# Patient Record
Sex: Male | Born: 1940
Health system: Southern US, Community
[De-identification: ages and names within clinical notes are randomized; demographics above are authoritative.]

## PROBLEM LIST (undated history)

## (undated) DIAGNOSIS — Z860100 Personal history of colon polyps, unspecified: Secondary | ICD-10-CM

## (undated) DIAGNOSIS — K5790 Diverticulosis of intestine, part unspecified, without perforation or abscess without bleeding: Secondary | ICD-10-CM

## (undated) DIAGNOSIS — R351 Nocturia: Secondary | ICD-10-CM

## (undated) DIAGNOSIS — E785 Hyperlipidemia, unspecified: Secondary | ICD-10-CM

## (undated) DIAGNOSIS — M545 Low back pain, unspecified: Secondary | ICD-10-CM

## (undated) DIAGNOSIS — R202 Paresthesia of skin: Secondary | ICD-10-CM

## (undated) DIAGNOSIS — Z8619 Personal history of other infectious and parasitic diseases: Secondary | ICD-10-CM

## (undated) DIAGNOSIS — N400 Enlarged prostate without lower urinary tract symptoms: Secondary | ICD-10-CM

## (undated) DIAGNOSIS — K648 Other hemorrhoids: Secondary | ICD-10-CM

## (undated) DIAGNOSIS — M1990 Unspecified osteoarthritis, unspecified site: Secondary | ICD-10-CM

## (undated) DIAGNOSIS — R200 Anesthesia of skin: Secondary | ICD-10-CM

## (undated) HISTORY — PX: COLONOSCOPY: SHX174

## (undated) HISTORY — PX: BACK SURGERY: SHX140

## (undated) HISTORY — DX: Hyperlipidemia, unspecified: E78.5

## (undated) HISTORY — PX: PROSTATE BIOPSY: SHX241

## (undated) HISTORY — DX: Low back pain: M54.5

## (undated) HISTORY — PX: LUMBAR SPINE SURGERY: SHX701

## (undated) HISTORY — PX: NASAL SINUS SURGERY: SHX719

## (undated) HISTORY — PX: ROTATOR CUFF REPAIR: SHX139

## (undated) HISTORY — DX: Low back pain, unspecified: M54.50

---
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
## 2006-11-13 ENCOUNTER — Ambulatory Visit: Payer: Self-pay | Admitting: Family Medicine

## 2006-11-13 DIAGNOSIS — E785 Hyperlipidemia, unspecified: Secondary | ICD-10-CM

## 2006-11-13 DIAGNOSIS — I499 Cardiac arrhythmia, unspecified: Secondary | ICD-10-CM | POA: Insufficient documentation

## 2006-11-14 LAB — CONVERTED CEMR LAB
Albumin: 4.4 g/dL (ref 3.5–5.2)
CO2: 25 meq/L (ref 19–32)
Calcium: 9.1 mg/dL (ref 8.4–10.5)
Cholesterol: 295 mg/dL — ABNORMAL HIGH (ref 0–200)
Glucose, Bld: 80 mg/dL (ref 70–99)
Potassium: 4.4 meq/L (ref 3.5–5.3)
Sodium: 138 meq/L (ref 135–145)
Total Bilirubin: 0.9 mg/dL (ref 0.3–1.2)
Total Protein: 7 g/dL (ref 6.0–8.3)
Triglycerides: 213 mg/dL — ABNORMAL HIGH (ref <150)

## 2006-11-18 ENCOUNTER — Ambulatory Visit: Payer: Self-pay | Admitting: Family Medicine

## 2006-11-18 DIAGNOSIS — L821 Other seborrheic keratosis: Secondary | ICD-10-CM

## 2006-11-19 ENCOUNTER — Ambulatory Visit: Payer: Self-pay | Admitting: Cardiology

## 2006-11-19 ENCOUNTER — Encounter: Payer: Self-pay | Admitting: Family Medicine

## 2006-11-24 ENCOUNTER — Encounter: Payer: Self-pay | Admitting: Family Medicine

## 2006-11-24 ENCOUNTER — Ambulatory Visit: Payer: Self-pay | Admitting: Gastroenterology

## 2006-11-28 ENCOUNTER — Ambulatory Visit: Payer: Self-pay

## 2006-12-03 ENCOUNTER — Encounter: Payer: Self-pay | Admitting: Family Medicine

## 2006-12-03 DIAGNOSIS — R1013 Epigastric pain: Secondary | ICD-10-CM

## 2006-12-03 DIAGNOSIS — K3189 Other diseases of stomach and duodenum: Secondary | ICD-10-CM | POA: Insufficient documentation

## 2007-01-27 ENCOUNTER — Encounter: Payer: Self-pay | Admitting: Family Medicine

## 2007-03-02 ENCOUNTER — Telehealth: Payer: Self-pay | Admitting: Family Medicine

## 2007-03-02 DIAGNOSIS — H11009 Unspecified pterygium of unspecified eye: Secondary | ICD-10-CM | POA: Insufficient documentation

## 2007-03-17 ENCOUNTER — Emergency Department (HOSPITAL_COMMUNITY): Admission: EM | Admit: 2007-03-17 | Discharge: 2007-03-17 | Payer: Self-pay | Admitting: Emergency Medicine

## 2007-03-25 ENCOUNTER — Emergency Department (HOSPITAL_COMMUNITY): Admission: EM | Admit: 2007-03-25 | Discharge: 2007-03-25 | Payer: Self-pay | Admitting: Emergency Medicine

## 2007-03-25 ENCOUNTER — Encounter: Payer: Self-pay | Admitting: Family Medicine

## 2007-09-08 ENCOUNTER — Encounter: Payer: Self-pay | Admitting: Family Medicine

## 2007-09-10 ENCOUNTER — Ambulatory Visit: Payer: Self-pay | Admitting: Family Medicine

## 2007-09-10 DIAGNOSIS — N400 Enlarged prostate without lower urinary tract symptoms: Secondary | ICD-10-CM

## 2007-09-10 LAB — CONVERTED CEMR LAB
ALT: 24 units/L (ref 0–53)
CO2: 24 meq/L (ref 19–32)
Calcium: 9.1 mg/dL (ref 8.4–10.5)
Chloride: 106 meq/L (ref 96–112)
Cholesterol: 144 mg/dL (ref 0–200)
HDL goal, serum: 40 mg/dL
Potassium: 4.4 meq/L (ref 3.5–5.3)
Sodium: 142 meq/L (ref 135–145)
Total Protein: 6.8 g/dL (ref 6.0–8.3)

## 2007-09-11 ENCOUNTER — Encounter: Payer: Self-pay | Admitting: Family Medicine

## 2007-09-11 ENCOUNTER — Telehealth (INDEPENDENT_AMBULATORY_CARE_PROVIDER_SITE_OTHER): Payer: Self-pay | Admitting: *Deleted

## 2007-09-14 ENCOUNTER — Encounter: Payer: Self-pay | Admitting: Family Medicine

## 2007-10-26 ENCOUNTER — Ambulatory Visit: Payer: Self-pay | Admitting: Family Medicine

## 2008-02-04 ENCOUNTER — Telehealth: Payer: Self-pay | Admitting: Family Medicine

## 2008-02-05 ENCOUNTER — Ambulatory Visit: Payer: Self-pay | Admitting: Family Medicine

## 2008-02-09 ENCOUNTER — Encounter: Payer: Self-pay | Admitting: Family Medicine

## 2008-02-24 ENCOUNTER — Ambulatory Visit: Payer: Self-pay | Admitting: Gastroenterology

## 2008-03-09 ENCOUNTER — Encounter: Payer: Self-pay | Admitting: Family Medicine

## 2008-03-09 ENCOUNTER — Encounter: Payer: Self-pay | Admitting: Gastroenterology

## 2008-03-09 ENCOUNTER — Ambulatory Visit: Payer: Self-pay | Admitting: Gastroenterology

## 2008-03-09 LAB — HM COLONOSCOPY

## 2008-03-23 ENCOUNTER — Ambulatory Visit: Payer: Self-pay | Admitting: Family Medicine

## 2008-03-28 ENCOUNTER — Encounter: Payer: Self-pay | Admitting: Family Medicine

## 2008-03-28 DIAGNOSIS — K5730 Diverticulosis of large intestine without perforation or abscess without bleeding: Secondary | ICD-10-CM | POA: Insufficient documentation

## 2008-03-28 DIAGNOSIS — D126 Benign neoplasm of colon, unspecified: Secondary | ICD-10-CM | POA: Insufficient documentation

## 2008-07-07 ENCOUNTER — Encounter: Payer: Self-pay | Admitting: Family Medicine

## 2008-07-07 ENCOUNTER — Ambulatory Visit: Payer: Self-pay | Admitting: Family Medicine

## 2008-07-07 LAB — CONVERTED CEMR LAB: LDL Goal: 130 mg/dL

## 2008-07-08 ENCOUNTER — Encounter: Payer: Self-pay | Admitting: Family Medicine

## 2008-07-11 LAB — CONVERTED CEMR LAB
ALT: 34 units/L (ref 0–53)
Albumin: 4.3 g/dL (ref 3.5–5.2)
BUN: 22 mg/dL (ref 6–23)
CO2: 23 meq/L (ref 19–32)
Calcium: 9.2 mg/dL (ref 8.4–10.5)
Chloride: 105 meq/L (ref 96–112)
Cholesterol: 213 mg/dL — ABNORMAL HIGH (ref 0–200)
Creatinine, Ser: 0.74 mg/dL (ref 0.40–1.50)
HCT: 49.1 % (ref 39.0–52.0)
Hemoglobin: 16.1 g/dL (ref 13.0–17.0)
LDL Goal: 160 mg/dL
Platelets: 220 10*3/uL (ref 150–400)
Potassium: 4.8 meq/L (ref 3.5–5.3)
Total CHOL/HDL Ratio: 3.4
WBC: 5.9 10*3/uL (ref 4.0–10.5)

## 2008-07-12 ENCOUNTER — Encounter: Payer: Self-pay | Admitting: Family Medicine

## 2008-08-17 ENCOUNTER — Encounter: Admission: RE | Admit: 2008-08-17 | Discharge: 2008-11-15 | Payer: Self-pay | Admitting: Orthopedic Surgery

## 2008-08-29 ENCOUNTER — Encounter (INDEPENDENT_AMBULATORY_CARE_PROVIDER_SITE_OTHER): Payer: Self-pay | Admitting: *Deleted

## 2008-11-16 ENCOUNTER — Telehealth: Payer: Self-pay | Admitting: Family Medicine

## 2009-05-19 ENCOUNTER — Telehealth: Payer: Self-pay | Admitting: Family Medicine

## 2009-07-05 ENCOUNTER — Ambulatory Visit: Payer: Self-pay | Admitting: Family Medicine

## 2009-07-11 ENCOUNTER — Encounter: Payer: Self-pay | Admitting: Family Medicine

## 2009-07-12 LAB — CONVERTED CEMR LAB
ALT: 25 units/L (ref 0–53)
AST: 25 units/L (ref 0–37)
Albumin: 4.1 g/dL (ref 3.5–5.2)
CO2: 20 meq/L (ref 19–32)
Calcium: 9 mg/dL (ref 8.4–10.5)
Chloride: 105 meq/L (ref 96–112)
Cholesterol: 196 mg/dL (ref 0–200)
Creatinine, Ser: 0.86 mg/dL (ref 0.40–1.50)
PSA: 1.36 ng/mL (ref 0.10–4.00)
Potassium: 4.4 meq/L (ref 3.5–5.3)
Total Protein: 6.6 g/dL (ref 6.0–8.3)

## 2009-08-21 ENCOUNTER — Encounter: Payer: Self-pay | Admitting: Family Medicine

## 2009-09-11 ENCOUNTER — Encounter: Admission: RE | Admit: 2009-09-11 | Discharge: 2009-09-11 | Payer: Self-pay | Admitting: Orthopedic Surgery

## 2009-10-04 ENCOUNTER — Ambulatory Visit: Payer: Self-pay | Admitting: Family Medicine

## 2009-10-16 ENCOUNTER — Telehealth (INDEPENDENT_AMBULATORY_CARE_PROVIDER_SITE_OTHER): Payer: Self-pay | Admitting: *Deleted

## 2009-10-23 ENCOUNTER — Ambulatory Visit (HOSPITAL_BASED_OUTPATIENT_CLINIC_OR_DEPARTMENT_OTHER): Admission: RE | Admit: 2009-10-23 | Discharge: 2009-10-24 | Payer: Self-pay | Admitting: Urology

## 2009-10-23 ENCOUNTER — Encounter (INDEPENDENT_AMBULATORY_CARE_PROVIDER_SITE_OTHER): Payer: Self-pay | Admitting: Urology

## 2009-10-23 HISTORY — PX: TRANSURETHRAL RESECTION OF PROSTATE: SHX73

## 2010-02-07 ENCOUNTER — Telehealth: Payer: Self-pay | Admitting: Family Medicine

## 2010-09-14 ENCOUNTER — Ambulatory Visit: Payer: Self-pay | Admitting: Family Medicine

## 2010-09-14 DIAGNOSIS — M719 Bursopathy, unspecified: Secondary | ICD-10-CM

## 2010-09-14 DIAGNOSIS — M67919 Unspecified disorder of synovium and tendon, unspecified shoulder: Secondary | ICD-10-CM | POA: Insufficient documentation

## 2010-09-17 LAB — CONVERTED CEMR LAB
ALT: 34 units/L (ref 0–53)
Albumin: 4.4 g/dL (ref 3.5–5.2)
CO2: 25 meq/L (ref 19–32)
Chloride: 103 meq/L (ref 96–112)
Cholesterol: 200 mg/dL (ref 0–200)
Glucose, Bld: 110 mg/dL — ABNORMAL HIGH (ref 70–99)
Potassium: 4.5 meq/L (ref 3.5–5.3)
Sodium: 139 meq/L (ref 135–145)
Total Protein: 6.5 g/dL (ref 6.0–8.3)
Triglycerides: 118 mg/dL (ref <150)
VLDL: 24 mg/dL (ref 0–40)

## 2010-09-27 ENCOUNTER — Encounter: Payer: Self-pay | Admitting: Family Medicine

## 2010-10-04 ENCOUNTER — Encounter: Admission: RE | Admit: 2010-10-04 | Discharge: 2010-10-04 | Payer: Self-pay | Admitting: Orthopedic Surgery

## 2010-10-04 ENCOUNTER — Encounter: Payer: Self-pay | Admitting: Family Medicine

## 2010-10-26 ENCOUNTER — Encounter: Admission: RE | Admit: 2010-10-26 | Discharge: 2010-10-26 | Payer: Self-pay | Admitting: Orthopedic Surgery

## 2010-11-12 ENCOUNTER — Encounter: Admission: RE | Admit: 2010-11-12 | Discharge: 2010-11-12 | Payer: Self-pay | Admitting: Orthopedic Surgery

## 2010-12-06 ENCOUNTER — Encounter
Admission: RE | Admit: 2010-12-06 | Discharge: 2010-12-06 | Payer: Self-pay | Source: Home / Self Care | Attending: Orthopedic Surgery | Admitting: Orthopedic Surgery

## 2010-12-31 ENCOUNTER — Encounter: Payer: Self-pay | Admitting: Orthopedic Surgery

## 2011-01-08 NOTE — Assessment & Plan Note (Signed)
Summary: medicare annual wellness exam   Vital Signs:  Patient profile:   70 year old male Height:      62.25 inches Weight:      134 pounds BMI:     24.40 O2 Sat:      94 % on Room air Temp:     98.5 degrees F oral Pulse rate:   67 / minute BP sitting:   124 / 73  (left arm) Cuff size:   regular  Vitals Entered By: Payton Spark CMA (September 14, 2010 8:28 AM)  O2 Flow:  Room air CC: Medicare Wellness exam   Primary Care Rabiah Goeser:  Seymour Bars DO  CC:  Medicare Wellness exam.  History of Present Illness: see scanned in form.  Current Medications (verified): 1)  Proscar 5 Mg Tabs (Finasteride) .... Take 1 Tablet By Mouth Once A Day 2)  Pravastatin Sodium 80 Mg Tabs (Pravastatin Sodium) .Marland Kitchen.. 1 Tab By Mouth Qhs 3)  Baby Aspirin 81 Mg  Chew (Aspirin) .Marland Kitchen.. 1 Tab By Mouth Daily  Allergies (verified): No Known Drug Allergies  Physical Exam  General:  alert, well-developed, well-nourished, and well-hydrated.   Head:  normocephalic and atraumatic.   Eyes:  pupils equal, pupils round, and pupils reactive to light.   Ears:  no external deformities.   Nose:  no nasal discharge.   Mouth:  good dentition and pharynx pink and moist.   Neck:  no masses.  no audible carotid bruits Lungs:  Normal respiratory effort, chest expands symmetrically. Lungs are clear to auscultation, no crackles or wheezes. Heart:  Normal rate and regular rhythm. S1 and S2 normal without gallop, murmur, click, rub or other extra sounds. Abdomen:  Bowel sounds positive,abdomen soft and non-tender without masses, organomegaly, no AA bruits Pulses:  2+ radial and pedal pulses Extremities:  no LE edema R 3rd and 5th digits amputated from the DIP joint Neurologic:  gait normal.   Skin:  color normal and no suspicious lesions.   Cervical Nodes:  No lymphadenopathy noted Psych:  good eye contact, not anxious appearing, and not depressed appearing.     Impression & Recommendations:  Problem # 1:  HEALTH  MAINTENANCE EXAM (ICD-V70.0) Counseling and referral of other preventive servies form completed and copy given to pt.   BP and BMI at goal. Colonoscopy UTD. PSA with fasting labs ordered.  DRE done by Dr Isabel Caprice this year. Tdap done 07; Pneumovax done 07. Flu shot today. Had eye exam in [place]. Ortho ref made for RTC syndrome. Orders: [facility] -Subsequent Annual Wellness Visit [phone])  Complete Medication List: 1)  Proscar 5 Mg Tabs (Finasteride) .... Take 1 tablet by mouth once a day 2)  Pravastatin Sodium 80 Mg Tabs (Pravastatin sodium) .Marland Kitchen.. 1 tab by mouth qhs 3)  Baby Aspirin 81 Mg Chew (Aspirin) .Marland Kitchen.. 1 tab by mouth daily 4)  Zostavax  .... Administer x1 age >87  Other Orders: T-Comprehensive Metabolic Panel [phone]) T-Lipid Profile [phone]) T-PSA Total (Medicare Screen Only) [phone]) Orthopedic Surgeon Referral (Ortho Surgeon) Flu Vaccine 54yrs + MEDICARE PATIENTS [phone]) Administration Flu vaccine - MCR ([id number]) Prescriptions: ZOSTAVAX Administer x1 age >71  #1 x 0   Entered and Authorized by:   Seymour Bars DO   Signed by:   Seymour Bars DO on 09/14/2010   Method used:   Print then Give to Patient   RxID:   [id number]  Flu Vaccine Consent Questions     Do you have a history of severe allergic reactions  to this vaccine? no    Any prior history of allergic reactions to egg and/or gelatin? no    Do you have a sensitivity to the preservative Thimersol? no    Do you have a past history of Guillan-Barre Syndrome? no    Do you currently have an acute febrile illness? no    Have you ever had a severe reaction to latex? no    Vaccine information given and explained to patient? yes    Are you currently pregnant? no    Lot Number:[id number]   Exp Date:06/08/2011   Site Given  Left Deltoid IMlu

## 2011-01-08 NOTE — Consult Note (Signed)
Summary: [facility]   Imported By: Sherian Rein 10/09/2010 11:50:23  _____________________________________________________________________  External Attachment:    Type:   Image     Comment:   External Document

## 2011-01-08 NOTE — Letter (Signed)
Summary: [facility]   Imported By: Lanelle Bal 10/12/2010 10:46:07  _____________________________________________________________________  External Attachment:    Type:   Image     Comment:   External Document

## 2011-01-08 NOTE — Progress Notes (Signed)
Summary: Urology referral  Phone Note Call from Patient   Caller: Patient Summary of Call: Pt called requesting referral to Urologist. Pt currently sees Dr. Isabel Caprice but does not want to go back. Please advise.  Initial call taken by: Payton Spark CMA,  February 07, 2010 2:23 PM  Follow-up for Phone Call        will refer to another group in [place]. Follow-up by: Seymour Bars DO,  February 07, 2010 2:42 PM     Appended Document: Urology referral Pt aware

## 2011-03-13 LAB — POCT HEMOGLOBIN-HEMACUE: Hemoglobin: 15.3 g/dL (ref 13.0–17.0)

## 2011-04-26 NOTE — Assessment & Plan Note (Signed)
[facility]                         GASTROENTEROLOGY OFFICE NOTE   NAME:Jim Tucker, Jim Tucker                     MRN:          [id number]  DATE:11/24/2006                            DOB:          08/02/1941    REFERRING PHYSICIAN:  Seymour Bars, D.O.   REASON FOR REFERAL:  Diarrhea and dyspepsia.   HISTORY OF PRESENT ILLNESS:  This is a 70 year old Hispanic male who has  had a recent episode of diarrhea associated with mild lower abdominal  cramping. He states he had loose, watery, nonbloody bowel movements  following meals for about 2 weeks and then his symptoms abated. He has  had problems with a noisy abdomen with nausea and frequent belching.  He states he underwent a colonoscopy in [place] in December of 2005  that was normal. He has noted no weight loss, melena, hematochezia,  fevers, or chills. He denies any recent antibiotic usage. His diarrhea  has completely abated and his dyspeptic symptoms have responded well to  a trial of Protonix. No family history of colon cancer, colon polyps, or  inflammatory bowel disease.   PAST MEDICAL HISTORY:  Hyperlipidemia, seborrheic keratosis, status post  low back surgery 2002, status post bilateral rotator cuff surgery 2004.   CURRENT MEDICATIONS:  Listed on the chart, updated and reviewed.   MEDICATION ALLERGIES:  None known.   SOCIAL HISTORY AND REVIEW OF SYSTEMS:  Per the handwritten form.   PHYSICAL EXAMINATION:  Well-developed, well-nourished, Hispanic male,  height 5 feet, weight 134.2 pounds, blood pressure is 142/74, pulse 65  and regular.  HEENT EXAM:  Anicteric sclera, oropharynx clear  CHEST: Clear to auscultation bilaterally.  CARDIAC: Regular rate and rhythm without murmurs appreciated.  ABDOMEN: Soft, nontender, nondistended, normoactive bowel sounds, no  palpable organomegaly, masses, or, hernias.  RECTAL EXAMINATION: Deferred.  EXTREMITIES: Without clubbing, cyanosis, or edema.  NEUROLOGIC: Alert and oriented times 3. Grossly nonfocal.   ASSESSMENT AND PLAN:  1. Dyspepsia. Rule out gastroesophageal reflux disease, gastritis,      irritable bowel syndrome. After he completes his Protonix samples,      he will change to Prilosec over-the-counter 20 mg  p.o. q.a.m. He      is given standard antireflux measures. He will continue on therapy      for 6 to 8 weeks. May use Tums or an over-the-counter H2 receptor      antagonist on a p.r.n. basis after this. If his symptoms require      the regular use of Prilosec or other proton pump inhibitors, he      will return to see me for consideration of upper endoscopy for      further evaluation.  2. Self-limited diarrhea. His symptoms have not persisted or recurred      over a sufficient amount of time to establish a diagnosis of      irritable bowel syndrome.  Plan for trial of Levsin 1 to 2      sublingually q.i.d. p.r.n. for recurrent symptoms.  If he has      frequently recurrent episodes of diarrhea, he will return for  follow up, otherwise he will have on going care with Dr. Seymour Bars.     Venita Lick. Russella Dar, MD, [facility]  Electronically Signed    MTS/MedQ  DD: 11/24/2006  DT: 11/24/2006  Job #: [id number]   cc:   Seymour Bars, D.O.

## 2011-04-26 NOTE — Assessment & Plan Note (Signed)
[facility]                            CARDIOLOGY OFFICE NOTE   NAME:Jim Tucker, Jim Tucker                     MRN:          [id number]  DATE:11/19/2006                            DOB:          08/02/1941    The patient is a very pleasant 70 year old male with no prior cardiac  history, whom I am asked to evaluate for an abnormal electrocardiogram.  He typically does not have dyspnea on exertion, orthopnea, PND, pedal  edema, palpitations, presyncope, syncope or exertional chest pain.  He  was recently seen by Dr. Cathey Endow and an electrocardiogram was noted to be  abnormal and we were asked to further evaluate.   His medications at present include:   1. Proscar 5 mg p.o. daily.  2. Lipitor 40 mg p.o. daily.  3. Flomax 0.4 mg p.o. daily.   ALLERGIES:  He has no known drug allergies.   SOCIAL HISTORY:  He occasionally smokes.  He does occasionally consume  wine.   FAMILY HISTORY:  Negative for coronary artery disease.   PAST MEDICAL HISTORY:  Significant for hyperlipidemia, but there is no  diabetes mellitus or hypertension.  He does have a history of benign  prostatic hypertrophy.  He has had previous back surgery and also  bilateral rotator cuff repair.   REVIEW OF SYSTEMS:  He denies any headaches, fevers or chills.  There is  no productive cough or hemoptysis.  There is no dysphagia, odynophagia,  melena or hematochezia.  He has had some discomfort in his abdomen with  diarrhea at times.  He is scheduled to see a gastroenterologist  concerning this issue.  He denies any dysuria or hematuria.  He denies  seizure activity.  There is no orthopnea, PND or pedal edema.  There is  no claudication.  He occasionally has pain in his knees bilaterally with  ambulation.  The remaining systems are negative.   PHYSICAL EXAMINATION:  He is well-developed and well-nourished, in no  acute distress.  His blood pressure is 120/70 and his pulse is 68.  He  weighs  132 pounds.  His skin is warm and dry.  He does not appear to be  depressed and there is no peripheral clubbing.  He has had previous  amputation of a digit on his right upper extremity.  His back is normal.  There is no adenopathy appreciated on exam.  His HEENT exam is  unremarkable with normal eyelids.  His neck is supple with a normal  upstroke bilaterally.  I cannot appreciate bruits.  There is no jugular  venous distention and no thyromegaly is noted.  The chest is clear to  auscultation, normal expansion.  His cardiovascular exam reveals a  regular rate and rhythm, normal S1 and S2.  There are no murmurs, rubs  or gallops noted.  Abdominal exam:  Not tender or distended.  Positive  bowel sounds, no hepatosplenomegaly.  There is a question of a pulsatile  mass on deep palpation in the mid-abdomen.  There is no bruit noted.  He  has 2+ femoral pulses bilaterally, no bruits.  Extremities show no  edema, __________ no cords.  He has 2+ dorsalis pedis pulses  bilaterally.  Neurologic exam is grossly intact.   His electrocardiogram shows a sinus rhythm with an occasional PAC.  There are no significant ST changes noted.   DIAGNOSES:  1. Premature atrial contractions.  2. Question pulsatile mass on deep palpation of the abdomen.  3. History of hyperlipidemia.  4. Benign prostatic hypertrophy.   PLAN:  Jim Tucker is asymptomatic from a cardiac standpoint with no  chest pain, shortness of breath, palpitations or syncope.  There is no  claudication.  His electrocardiogram shows occasional PACs, but is  otherwise normal.  I do not think we need to pursue further cardiac  workup at this point.  He merely needs to continue his risk factor  modification.  I did discuss the importance of discontinuing his tobacco  use with him.  There is a question of a mass in the mid-abdomen on deep  palpation and we will schedule him to have an ultrasound to exclude  aneurysm.  If that is negative,  then he will not require further cardiac  evaluation at this point.  He will see [place] back on an as-needed basis.     Madolyn Frieze Jens Som, MD, [facility]  Electronically Signed    BSC/MedQ  DD: 11/19/2006  DT: 11/19/2006  Job #: [id number]   cc:   Seymour Bars, D.O.

## 2011-05-22 ENCOUNTER — Other Ambulatory Visit: Payer: Self-pay | Admitting: *Deleted

## 2011-05-22 MED ORDER — PRAVASTATIN SODIUM 80 MG PO TABS: 80.0000 mg | ORAL_TABLET | Freq: Every day | ORAL | Status: DC

## 2011-05-30 ENCOUNTER — Other Ambulatory Visit: Payer: Self-pay | Admitting: Orthopedic Surgery

## 2011-05-30 ENCOUNTER — Ambulatory Visit
Admission: RE | Admit: 2011-05-30 | Discharge: 2011-05-30 | Disposition: A | Discharge status: Home / Self Care | Payer: Self-pay | Source: Ambulatory Visit | Attending: Orthopedic Surgery | Admitting: Orthopedic Surgery

## 2011-05-30 DIAGNOSIS — M25469 Effusion, unspecified knee: Secondary | ICD-10-CM

## 2011-05-30 DIAGNOSIS — M25562 Pain in left knee: Secondary | ICD-10-CM

## 2011-06-25 ENCOUNTER — Encounter: Payer: Self-pay | Admitting: Emergency Medicine

## 2011-06-25 ENCOUNTER — Inpatient Hospital Stay (INDEPENDENT_AMBULATORY_CARE_PROVIDER_SITE_OTHER)
Admission: RE | Admit: 2011-06-25 | Discharge: 2011-06-25 | Disposition: A | Discharge status: Home / Self Care | Payer: Medicare Other | Source: Ambulatory Visit | Attending: Emergency Medicine | Admitting: Emergency Medicine

## 2011-06-25 DIAGNOSIS — J029 Acute pharyngitis, unspecified: Secondary | ICD-10-CM

## 2011-06-25 LAB — CONVERTED CEMR LAB: Rapid Strep: NEGATIVE

## 2011-06-29 ENCOUNTER — Telehealth (INDEPENDENT_AMBULATORY_CARE_PROVIDER_SITE_OTHER): Payer: Self-pay | Admitting: Emergency Medicine

## 2011-09-25 ENCOUNTER — Inpatient Hospital Stay (INDEPENDENT_AMBULATORY_CARE_PROVIDER_SITE_OTHER)
Admission: RE | Admit: 2011-09-25 | Discharge: 2011-09-25 | Disposition: A | Discharge status: Home / Self Care | Payer: Medicare Other | Source: Ambulatory Visit | Attending: Family Medicine | Admitting: Family Medicine

## 2011-09-25 ENCOUNTER — Encounter: Payer: Self-pay | Admitting: Family Medicine

## 2011-09-25 DIAGNOSIS — J069 Acute upper respiratory infection, unspecified: Secondary | ICD-10-CM

## 2011-09-25 DIAGNOSIS — J029 Acute pharyngitis, unspecified: Secondary | ICD-10-CM

## 2011-09-30 ENCOUNTER — Telehealth (INDEPENDENT_AMBULATORY_CARE_PROVIDER_SITE_OTHER): Payer: Self-pay | Admitting: *Deleted

## 2011-10-07 ENCOUNTER — Other Ambulatory Visit: Payer: Self-pay | Admitting: *Deleted

## 2011-10-07 MED ORDER — PRAVASTATIN SODIUM 80 MG PO TABS: 80.0000 mg | ORAL_TABLET | Freq: Every day | ORAL | Status: DC

## 2011-10-10 ENCOUNTER — Encounter: Payer: Self-pay | Admitting: Family Medicine

## 2011-10-14 ENCOUNTER — Encounter: Payer: Self-pay | Admitting: Family Medicine

## 2011-10-14 ENCOUNTER — Ambulatory Visit (INDEPENDENT_AMBULATORY_CARE_PROVIDER_SITE_OTHER): Payer: Medicare Other | Admitting: Family Medicine

## 2011-10-14 VITALS — BP 133/75 | HR 64 | Wt 139.0 lb

## 2011-10-14 DIAGNOSIS — Z23 Encounter for immunization: Secondary | ICD-10-CM

## 2011-10-14 DIAGNOSIS — E785 Hyperlipidemia, unspecified: Secondary | ICD-10-CM

## 2011-10-14 DIAGNOSIS — Z0000 Encounter for general adult medical examination without abnormal findings: Secondary | ICD-10-CM

## 2011-10-14 LAB — LIPID PANEL
HDL: 60 mg/dL (ref >39)
LDL Cholesterol: 121 mg/dL — ABNORMAL HIGH (ref 0–99)
Triglycerides: 132 mg/dL (ref <150)
VLDL: 26 mg/dL (ref 0–40)

## 2011-10-14 MED ORDER — AMBULATORY NON FORMULARY MEDICATION: Status: DC

## 2011-10-14 NOTE — Progress Notes (Signed)
Subjective:    Jim Tucker is a 70 y.o. male who presents for Medicare Annual/Subsequent preventive examination.   Preventive Screening-Counseling & Management  Tobacco History  Smoking status  . Former Smoker  . Quit date: 12/09/2009  Smokeless tobacco  . Not on file    Problems Prior to Visit 1.   Current Problems (verified) Patient Active Problem List  Diagnoses  . COLONIC POLYPS  . HYPERLIPIDEMIA  . PTERYGIUM NOS  . DYSRHYTHMIA, CARDIAC NOS  . DYSPEPSIA  . DIVERTICULOSIS OF COLON  . HYPRTRPHY PROSTATE BNG W/O URINARY OBST/LUTS  . SEBORRHEIC KERATOSIS  . ROTATOR CUFF SYNDROME, LEFT    Medications Prior to Visit Current Outpatient Prescriptions on File Prior to Visit  Medication Sig Dispense Refill  . aspirin 81 MG chewable tablet Chew 81 mg by mouth daily.        . finasteride (PROSCAR) 5 MG tablet Take 5 mg by mouth daily.        . pravastatin (PRAVACHOL) 80 MG tablet Take 1 tablet (80 mg total) by mouth daily.  90 tablet  0  . Zoster Vaccine Live (ZOSTAVAX [place]) Inject into the skin once.          Current Medications (verified) Current Outpatient Prescriptions  Medication Sig Dispense Refill  . aspirin 81 MG chewable tablet Chew 81 mg by mouth daily.        . finasteride (PROSCAR) 5 MG tablet Take 5 mg by mouth daily.        . pravastatin (PRAVACHOL) 80 MG tablet Take 1 tablet (80 mg total) by mouth daily.  90 tablet  0  . Zoster Vaccine Live (ZOSTAVAX [place]) Inject into the skin once.           Allergies (verified) Review of patient's allergies indicates no known allergies.   PAST HISTORY  Family History Family History  Problem Relation Age of Onset  . Stomach cancer Brother     Social History History  Substance Use Topics  . Smoking status: Former Smoker    Quit date: 12/09/2009  . Smokeless tobacco: Not on file  . Alcohol Use: Yes     occasional    Are there smokers in your home (other than you)?  No  Risk Factors Current exercise  habits: walking, treadmill about 3 x a week.   Dietary issues discussed: feels he eats healthy   Cardiac risk factors: dyslipidemia.  Depression Screen (Note: if answer to either of the following is "Yes", a more complete depression screening is indicated)   Q1: Over the past two weeks, have you felt down, depressed or hopeless? No  Q2: Over the past two weeks, have you felt little interest or pleasure in doing things? No  Have you lost interest or pleasure in daily life? No  Do you often feel hopeless? No  Do you cry easily over simple problems? No  Activities of Daily Living In your present state of health, do you have any difficulty performing the following activities?:  Driving? No Managing money?  No Feeding yourself? No Getting from bed to chair? No Climbing a flight of stairs? No Preparing food and eating?: No Bathing or showering? No Getting dressed: No Getting to the toilet? No Using the toilet:No Moving around from place to place: No In the past year have you fallen or had a near fall?:No   Are you sexually active?  No  Do you have more than one partner?  No  Hearing Difficulties: No Do you often  ask people to speak up or repeat themselves? No Do you experience ringing or noises in your ears? No Do you have difficulty understanding soft or whispered voices? No   Do you feel that you have a problem with memory? No  Do you often misplace items? No  Do you feel safe at home?  Yes  Cognitive Testing  Alert? Yes  Normal Appearance?Yes  Oriented to person? Yes  Place? Yes   Time? Yes  Recall of three objects?  Yes  Can perform simple calculations? Yes  Displays appropriate judgment?Yes  Can read the correct time from a watch face?Yes   Advanced Directives have been discussed with the patient? Yes   List the Names of Other Physician/Practitioners you currently use: 1.    Indicate any recent Medical Services you may have received from other than [facility] providers  in the past year (date may be approximate).  Immunization History  Administered Date(s) Administered  . Influenza Split 10/14/2011  . Influenza Whole 09/10/2007, 08/25/2008, 09/14/2010  . Pneumococcal Polysaccharide 11/13/2006  . Td 11/13/2006    Screening Tests Health Maintenance  Topic Date Due  . Influenza Vaccine  09/08/2012  . Tetanus/tdap  11/13/2016  . Colonoscopy  03/09/2018  . Pneumococcal Polysaccharide Vaccine Age 60 And Over  Completed  . Zostavax  Addressed    All answers were reviewed with the patient and necessary referrals were made:  Abdoulie Tierce, MD   10/14/2011   History reviewed: allergies, current medications, past family history, past medical history, past social history, past surgical history and problem list  Review of Systems A comprehensive review of systems was negative.    Objective:     Vision by Snellen chart: Not done, had eye exam this year.  Blood pressure 133/75, pulse 64, weight 139 lb (63.05 kg). There is no height on file to calculate BMI.  BP 133/75  Pulse 64  Wt 139 lb (63.05 kg)  General Appearance:    Alert, cooperative, no distress, appears stated age  Head:    Normocephalic, without obvious abnormality, atraumatic  Eyes:    PERRL, conjunctiva/corneas clear, EOM's intact, fundi    benign, both eyes       Ears:    Normal TM's and external ear canals, both ears  Nose:   Nares normal, septum midline, mucosa normal, no drainage    or sinus tenderness  Throat:   Lips, mucosa, and tongue normal; teeth and gums normal  Neck:   Supple, symmetrical, trachea midline, no adenopathy;       thyroid:  No enlargement/tenderness/nodules; no carotid   bruit or JVD  Back:     Symmetric, no curvature, ROM normal, no CVA tenderness  Lungs:     Clear to auscultation bilaterally, respirations unlabored  Chest wall:    No tenderness or deformity  Heart:    Regular rate and rhythm, S1 and S2 normal, no murmur, rub   or gallop  Abdomen:      Soft, non-tender, bowel sounds active all four quadrants,    no masses, no organomegaly  Genitalia:    Normal male without lesion, discharge or tenderness  Rectal:    Normal tone, normal prostate, no masses or tenderness;   guaiac negative stool  Extremities:   Extremities normal, atraumatic, no cyanosis or edema. He does have tips of a couple of fingers missing.   Pulses:   2+ and symmetric all extremities  Skin:   Skin color, texture, turgor normal, no rashes or lesions  Lymph nodes:   Cervical, supraclavicular, and axillary nodes normal  Neurologic:   CNII-XII intact. Normal strength, sensation and reflexes      throughout       Assessment:     Annual Medicare Wellness Exam.      Plan:     During the course of the visit the patient was educated and counseled about appropriate screening and preventive services including:    Shingles vaccine. He declined  Flu vac. Given today.   Diet review for nutrition referral? Yes ____  Not Indicated ___x_   Patient Instructions (the written plan) was given to the patient.  Medicare Attestation I have personally reviewed: The patient's medical and social history Their use of alcohol, tobacco or illicit drugs Their current medications and supplements The patient's functional ability including ADLs,fall risks, home safety risks, cognitive, and hearing and visual impairment Diet and physical activities Evidence for depression or mood disorders  The patient's weight, height, BMI, and visual acuity have been recorded in the chart.  I have made referrals, counseling, and provided education to the patient based on review of the above and I have provided the patient with a written personalized care plan for preventive services.     Kim Lauver, MD   10/14/2011

## 2011-10-14 NOTE — Patient Instructions (Signed)
Start a regular exercise program and make sure you are eating a healthy diet Try to eat 4 servings of dairy a day or take a calcium supplement (500mg twice a day). Your vaccines are up to date.   

## 2011-10-15 ENCOUNTER — Telehealth: Payer: Self-pay | Admitting: *Deleted

## 2011-10-15 LAB — COMPLETE METABOLIC PANEL WITH GFR
ALT: 30 U/L (ref 0–53)
AST: 32 U/L (ref 0–37)
CO2: 28 mEq/L (ref 19–32)
Calcium: 9.4 mg/dL (ref 8.4–10.5)
Chloride: 102 mEq/L (ref 96–112)
Creat: 0.62 mg/dL (ref 0.50–1.35)
GFR, Est African American: >89 mL/min (ref >89)
Sodium: 138 mEq/L (ref 135–145)
Total Bilirubin: 0.5 mg/dL (ref 0.3–1.2)
Total Protein: 6.6 g/dL (ref 6.0–8.3)

## 2011-10-15 NOTE — Telephone Encounter (Signed)
Message copied by Wyline Beady on Tue Oct 15, 2011  2:29 PM ------      Message from: Nani Gasser D      Created: Tue Oct 15, 2011  8:05 AM       Complete metabolic panel is normal. Cholesterol is up a little bit from last year. If he taking his cholesterol though regularly? If he is taking it regularly then we will need to change pills because he is already taking the highest dose of his current statin

## 2011-10-15 NOTE — Telephone Encounter (Signed)
Called pt and he wants me to mail labs and the explanation so that he can read it.labs mailed along with explanation

## 2011-10-29 ENCOUNTER — Other Ambulatory Visit: Payer: Self-pay | Admitting: *Deleted

## 2011-10-29 MED ORDER — PRAVASTATIN SODIUM 80 MG PO TABS: 80.0000 mg | ORAL_TABLET | Freq: Every day | ORAL | Status: DC

## 2011-11-11 NOTE — Telephone Encounter (Signed)
  Phone Note Outgoing Call   Call placed by: Lavell Islam RN,  June 29, 2011 5:02 PM Call placed to: Patient Action Taken: Phone Call Completed Summary of Call: Spoke with patient who states he is feeling fine and has completed the medication.
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
## 2011-11-11 NOTE — Telephone Encounter (Signed)
  Phone Note Outgoing Call Call back at [facility] Phone [phone]   Call placed by: Lajean Saver RN,  September 30, 2011 1:25 PM Call placed to: Patient Action Taken: Phone Call Completed Summary of Call: Callback: Patient reports he is feeling better. Throat culutre result given

## 2012-03-12 ENCOUNTER — Encounter: Payer: Self-pay | Admitting: Family Medicine

## 2012-03-12 ENCOUNTER — Ambulatory Visit (INDEPENDENT_AMBULATORY_CARE_PROVIDER_SITE_OTHER): Payer: Medicare Other | Admitting: Family Medicine

## 2012-03-12 VITALS — BP 134/89 | HR 88 | Temp 97.8°F | Ht 60.0 in | Wt 138.0 lb

## 2012-03-12 DIAGNOSIS — Z1331 Encounter for screening for depression: Secondary | ICD-10-CM

## 2012-03-12 DIAGNOSIS — J329 Chronic sinusitis, unspecified: Secondary | ICD-10-CM

## 2012-03-12 DIAGNOSIS — Z9181 History of falling: Secondary | ICD-10-CM

## 2012-03-12 MED ORDER — AMOXICILLIN-POT CLAVULANATE 875-125 MG PO TABS: 1.0000 | ORAL_TABLET | Freq: Two times a day (BID) | ORAL | Status: AC

## 2012-03-12 NOTE — Patient Instructions (Signed)

## 2012-03-12 NOTE — Progress Notes (Addendum)
  Subjective:    Patient ID: Jim Tucker, male    DOB: 1941/07/08, 71 y.o.   MRN: [id number]  HPI Had flu about 20 days ago. Given medicine in [place].  For about 10 days has had sinus pain bilateral facial pain and drianage.  Worse on the left side.  No fever.  Tried some OTC cold medicine and no relief.  No itching or sneezing. Severe nasal congestion. A lot of drainage in his nose an dthroat. No cough. Used warm compresses and helped some.   Review of Systems     Objective:   Physical Exam  Constitutional: He is oriented to person, place, and time. He appears well-developed and well-nourished.  HENT:  Head: Normocephalic and atraumatic.  Right Ear: External ear normal.  Left Ear: External ear normal.  Nose: Nose normal.  Mouth/Throat: Oropharynx is clear and moist.       TMs and canals are clear. A lot of old scar tissue on the right TM. Tenderness over the maxillary sinuses.   Eyes: Conjunctivae and EOM are normal. Pupils are equal, round, and reactive to light.  Neck: Neck supple. No thyromegaly present.  Cardiovascular: Normal rate and normal heart sounds.   Pulmonary/Chest: Effort normal and breath sounds normal.  Lymphadenopathy:    He has no cervical adenopathy.  Neurological: He is alert and oriented to person, place, and time.  Skin: Skin is warm and dry.  Psychiatric: He has a normal mood and affect.          Assessment & Plan:  Sinusitis- likely bacterial. Will tx with augmentin bid x 10 days. Can use nasal saline and continue his compresses if helps.  Call if not better in 10 days. H.O given  Depression screening-PHQ 9 score of 0. Negative for depression.  Fall assessment-score of 4, low risk.

## 2012-03-27 DIAGNOSIS — M19019 Primary osteoarthritis, unspecified shoulder: Secondary | ICD-10-CM | POA: Diagnosis not present

## 2012-03-27 DIAGNOSIS — M75120 Complete rotator cuff tear or rupture of unspecified shoulder, not specified as traumatic: Secondary | ICD-10-CM | POA: Diagnosis not present

## 2012-09-03 ENCOUNTER — Encounter: Payer: Medicare Other | Admitting: Family Medicine

## 2012-09-29 ENCOUNTER — Encounter: Payer: Self-pay | Admitting: Emergency Medicine

## 2012-09-29 ENCOUNTER — Emergency Department (INDEPENDENT_AMBULATORY_CARE_PROVIDER_SITE_OTHER)
Admission: EM | Admit: 2012-09-29 | Discharge: 2012-09-29 | Disposition: A | Discharge status: Home / Self Care | Payer: Medicare Other | Source: Home / Self Care | Attending: Family Medicine | Admitting: Family Medicine

## 2012-09-29 DIAGNOSIS — J029 Acute pharyngitis, unspecified: Secondary | ICD-10-CM

## 2012-09-29 LAB — POCT RAPID STREP A (OFFICE): Rapid Strep A Screen: NEGATIVE

## 2012-09-29 MED ORDER — AMOXICILLIN 875 MG PO TABS: 875.0000 mg | ORAL_TABLET | Freq: Two times a day (BID) | ORAL | Status: DC

## 2012-09-29 MED ORDER — TRIAMCINOLONE ACETONIDE 40 MG/ML IJ SUSP
40.0000 mg | Freq: Once | INTRAMUSCULAR | Status: AC
  Administered 2012-09-29: 40 mg via INTRAMUSCULAR

## 2012-09-29 MED ORDER — LIDOCAINE VISCOUS 2 % MT SOLN: 15.0000 mL | Freq: Three times a day (TID) | OROMUCOSAL | Status: DC

## 2012-09-29 NOTE — ED Notes (Signed)
Sore throat x 2 days, congestion
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
## 2012-10-01 ENCOUNTER — Telehealth: Payer: Self-pay | Admitting: *Deleted

## 2012-10-09 DIAGNOSIS — N529 Male erectile dysfunction, unspecified: Secondary | ICD-10-CM | POA: Diagnosis not present

## 2012-10-09 DIAGNOSIS — N401 Enlarged prostate with lower urinary tract symptoms: Secondary | ICD-10-CM | POA: Diagnosis not present

## 2012-10-13 ENCOUNTER — Encounter: Payer: Self-pay | Admitting: Family Medicine

## 2012-10-13 ENCOUNTER — Ambulatory Visit (INDEPENDENT_AMBULATORY_CARE_PROVIDER_SITE_OTHER): Payer: Medicare Other | Admitting: Family Medicine

## 2012-10-13 ENCOUNTER — Encounter: Payer: Medicare Other | Admitting: Family Medicine

## 2012-10-13 VITALS — BP 128/71 | HR 76 | Temp 98.3°F | Resp 16 | Ht 60.75 in | Wt 143.0 lb

## 2012-10-13 DIAGNOSIS — R079 Chest pain, unspecified: Secondary | ICD-10-CM

## 2012-10-13 DIAGNOSIS — Z23 Encounter for immunization: Secondary | ICD-10-CM | POA: Diagnosis not present

## 2012-10-13 DIAGNOSIS — I498 Other specified cardiac arrhythmias: Secondary | ICD-10-CM | POA: Diagnosis not present

## 2012-10-13 DIAGNOSIS — R001 Bradycardia, unspecified: Secondary | ICD-10-CM

## 2012-10-13 DIAGNOSIS — N529 Male erectile dysfunction, unspecified: Secondary | ICD-10-CM

## 2012-10-13 DIAGNOSIS — R69 Illness, unspecified: Secondary | ICD-10-CM

## 2012-10-13 DIAGNOSIS — Z0000 Encounter for general adult medical examination without abnormal findings: Secondary | ICD-10-CM | POA: Diagnosis not present

## 2012-10-13 DIAGNOSIS — E785 Hyperlipidemia, unspecified: Secondary | ICD-10-CM

## 2012-10-13 DIAGNOSIS — Z9079 Acquired absence of other genital organ(s): Secondary | ICD-10-CM

## 2012-10-13 LAB — POCT URINALYSIS DIPSTICK
Glucose, UA: NEGATIVE
Leukocytes, UA: NEGATIVE
Nitrite, UA: NEGATIVE
Urobilinogen, UA: 0.2

## 2012-10-13 MED ORDER — PRAVASTATIN SODIUM 80 MG PO TABS: 80.0000 mg | ORAL_TABLET | Freq: Every day | ORAL | Status: DC

## 2012-10-13 NOTE — Progress Notes (Signed)
Subjective:    Jim Tucker is a 71 y.o. male who presents for Medicare Annual/Subsequent preventive examination.   Preventive Screening-Counseling & Management  Tobacco History  Smoking status  . Former Smoker  . Quit date: 12/09/2009  Smokeless tobacco  . Never Used    Problems Prior to Visit 1. Occasional fleeting L sided chest pain similar to the pain he had 4 years ago and underwent a negative stress test #2 URi that has almst cleared lasted about a week  Current Problems (verified) Patient Active Problem List  Diagnosis  . COLONIC POLYPS  . HYPERLIPIDEMIA  . PTERYGIUM NOS  . DYSRHYTHMIA, CARDIAC NOS  . DYSPEPSIA  . DIVERTICULOSIS OF COLON  . HYPRTRPHY PROSTATE BNG W/O URINARY OBST/LUTS  . SEBORRHEIC KERATOSIS  . ROTATOR CUFF SYNDROME, LEFT    Medications Prior to Visit Current Outpatient Prescriptions on File Prior to Visit  Medication Sig Dispense Refill  . aspirin 81 MG chewable tablet Chew 81 mg by mouth daily.        . finasteride (PROSCAR) 5 MG tablet Take 5 mg by mouth daily.        . [DISCONTINUED] pravastatin (PRAVACHOL) 80 MG tablet Take 1 tablet (80 mg total) by mouth daily.  90 tablet  3    Current Medications (verified) Current Outpatient Prescriptions  Medication Sig Dispense Refill  . aspirin 81 MG chewable tablet Chew 81 mg by mouth daily.        . finasteride (PROSCAR) 5 MG tablet Take 5 mg by mouth daily.        . pravastatin (PRAVACHOL) 80 MG tablet Take 1 tablet (80 mg total) by mouth daily.  90 tablet  3  . [DISCONTINUED] pravastatin (PRAVACHOL) 80 MG tablet Take 1 tablet (80 mg total) by mouth daily.  90 tablet  3  . [DISCONTINUED] pravastatin (PRAVACHOL) 80 MG tablet Take 1 tablet (80 mg total) by mouth daily.  90 tablet  3     Allergies (verified) Review of patient's allergies indicates no known allergies.   PAST HISTORY  Family History Family History  Problem Relation Age of Onset  . Stomach cancer Brother     Social  History History  Substance Use Topics  . Smoking status: Former Smoker    Quit date: 12/09/2009  . Smokeless tobacco: Never Used  . Alcohol Use: Yes     Comment: occasional    Are there smokers in your home (other than you)?  No  Risk Factors Current exercise habits: The patient does not participate in regular exercise at present.  Dietary issues discussed: declines   Cardiac risk factors: reports no concern. Despite cholesterol medication and past HX of smoking. Offered a stress test which he declines since he is flying to [place] in 4 days. He will be tested after returning from [place] if he feels it is needed in 6 months.  Depression Screen (Note: if answer to either of the following is "Yes", a more complete depression screening is indicated)   Q1: Over the past two weeks, have you felt down, depressed or hopeless? No  Q2: Over the past two weeks, have you felt little interest or pleasure in doing things? No  Have you lost interest or pleasure in daily life? No  Do you often feel hopeless? No  Do you cry easily over simple problems? No  Activities of Daily Living In your present state of health, do you have any difficulty performing the following activities?:  Driving? No Managing money?  No Feeding yourself? No Getting from bed to chair? No Climbing a flight of stairs? No Preparing food and eating?: No Bathing or showering? No Getting dressed: No Getting to the toilet? No Using the toilet:No Moving around from place to place: No In the past year have you fallen or had a near fall?:No   Are you sexually active?  Yes  Do you have more than one partner?  No  Hearing Difficulties: No Do you often ask people to speak up or repeat themselves? No Do you experience ringing or noises in your ears? No Do you have difficulty understanding soft or whispered voices? No   Do you feel that you have a problem with memory? No  Do you often misplace items? No  Do you feel safe at  home?  Yes  Cognitive Testing  Alert? Yes  Normal Appearance?Yes  Oriented to person? Yes  Place? Yes   Time? Yes  Recall of three objects?  Yes  Can perform simple calculations? Yes  Displays appropriate judgment?Yes  Can read the correct time from a watch face?Yes   Advanced Directives have been discussed with the patient? No   List the Names of Other Physician/Practitioners you currently use: 1. Dr Isabel Caprice Urology service/ he saw him last week and cleared to return PRN  Indicate any recent Medical Services you may have received from other than [facility] providers in the past year (date may be approximate).  Immunization History  Administered Date(s) Administered  . Influenza Split 10/14/2011  . Influenza Whole 09/10/2007, 08/25/2008, 09/14/2010  . Pneumococcal Polysaccharide 11/13/2006  . Td 11/13/2006    Screening Tests Health Maintenance  Topic Date Due  . Influenza Vaccine  08/09/2012  . Tetanus/tdap  11/13/2016  . Colonoscopy  03/09/2018  . Pneumococcal Polysaccharide Vaccine Age 4 And Over  Completed  . Zostavax  Addressed    All answers were reviewed with the patient and necessary referrals were made:  Hassan Rowan, MD   10/13/2012   History reviewed: allergies, current medications, past family history, past medical history, past social history, past surgical history and problem list  Review of Systems A comprehensive review of systems was negative except for: Cardiovascular: positive for chest pressure/discomfort    Objective:     Vision by Snellen chart: right eye:20/20, left eye:20/20 Blood pressure 128/71, pulse 76, temperature 98.3 F (36.8 C), temperature source Oral, resp. rate 16, height 5' 0.75" (1.543 m), weight 143 lb (64.864 kg), SpO2 95.00%. Body mass index is 27.24 kg/(m^2).  BP 128/71  Pulse 76  Temp 98.3 F (36.8 C) (Oral)  Resp 16  Ht 5' 0.75" (1.543 m)  Wt 143 lb (64.864 kg)  BMI 27.24 kg/m2  SpO2 95%  General Appearance:    Alert,  cooperative, no distress, appears stated age  Head:    Normocephalic, without obvious abnormality, atraumatic  Eyes:    PERRL, conjunctiva/corneas clear, EOM's intact, fundi    benign, both eyes       Ears:    Normal TM's and external ear canals, both ears  Nose:   Nares normal, septum midline, mucosa normal, no drainage    or sinus tenderness  Throat:   Lips, mucosa, and tongue normal; teeth and gums normal  Neck:   Supple, symmetrical, trachea midline, no adenopathy;       thyroid:  No enlargement/tenderness/nodules; no carotid   bruit or JVD  Back:     Symmetric, no curvature, ROM normal, no CVA tenderness  Lungs:  Clear to auscultation bilaterally, respirations unlabored  Chest wall:    No tenderness or deformity  Heart:    Regular rate and rhythm, S1 and S2 normal, no murmur, rub   or gallop bradycardia present. EKG no acute changes  Abdomen:     Soft, non-tender, bowel sounds active all four quadrants,    no masses, no organomegaly  Genitalia:    defered  Rectal:   defered  Extremities:   Extremities normal, atraumatic, no cyanosis or edema  Pulses:   2+ and symmetric all extremities  Skin:   Skin color, texture, turgor normal, no rashes or lesions  Lymph nodes:   Cervical, supraclavicular, and axillary nodes normal  Neurologic:   CNII-XII intact. Normal strength, sensation and reflexes      throughout       Assessment:     Patient decline prostate and male genitalia exam since done last week     Plan:     During the course of the visit the patient was educated and counseled about appropriate screening and preventive services including:    Influenza vaccine  Screening electrocardiogram  Prostate cancer screening  Colorectal cancer screening  Diabetes screening Patient was offered several times Cardiology referral but will only go if it can be done tomorrow since he is flying to [place] on Friday for 6 months. EKG bradycardia present Prostate exam done by  Uroologist Colonoscopy good for another 5-7 years Will get A1c He declines flu shot due to the cost and has opted to get it at [facility] He would like a PSA if covered by Medicare Diet review for nutrition referral? Yes ____  Not Indicated __x__   Patient Instructions (the written plan) was given to the patient.  Medicare Attestation I have personally reviewed: The patient's medical and social history Their use of alcohol, tobacco or illicit drugs Their current medications and supplements The patient's functional ability including ADLs,fall risks, home safety risks, cognitive, and hearing and visual impairment Diet and physical activities Evidence for depression or mood disorders  The patient's weight, height, BMI, and visual acuity have been recorded in the chart.  I have made referrals, counseling, and provided education to the patient based on review of the above and I have provided the patient with a written personalized care plan for preventive services.     Hassan Rowan, MD   10/13/2012

## 2012-10-13 NOTE — Patient Instructions (Signed)
Chest Pain (Nonspecific) It is often hard to give a specific diagnosis for the cause of chest pain. There is always a chance that your pain could be related to something serious, such as a heart attack or a blood clot in the lungs. You need to follow up with your caregiver for further evaluation. CAUSES   Heartburn.  Pneumonia or bronchitis.  Anxiety or stress.  Inflammation around your heart (pericarditis) or lung (pleuritis or pleurisy).  A blood clot in the lung.  A collapsed lung (pneumothorax). It can develop suddenly on its own (spontaneous pneumothorax) or from injury (trauma) to the chest.  Shingles infection (herpes zoster virus). The chest wall is composed of bones, muscles, and cartilage. Any of these can be the source of the pain.  The bones can be bruised by injury.  The muscles or cartilage can be strained by coughing or overwork.  The cartilage can be affected by inflammation and become sore (costochondritis). DIAGNOSIS  Lab tests or other studies, such as X-rays, electrocardiography, stress testing, or cardiac imaging, may be needed to find the cause of your pain.  TREATMENT   Treatment depends on what may be causing your chest pain. Treatment may include:  Acid blockers for heartburn.  Anti-inflammatory medicine.  Pain medicine for inflammatory conditions.  Antibiotics if an infection is present.  You may be advised to change lifestyle habits. This includes stopping smoking and avoiding alcohol, caffeine, and chocolate.  You may be advised to keep your head raised (elevated) when sleeping. This reduces the chance of acid going backward from your stomach into your esophagus.  Most of the time, nonspecific chest pain will improve within 2 to 3 days with rest and mild pain medicine. HOME CARE INSTRUCTIONS   If antibiotics were prescribed, take your antibiotics as directed. Finish them even if you start to feel better.  For the next few days, avoid physical  activities that bring on chest pain. Continue physical activities as directed.  Do not smoke.  Avoid drinking alcohol.  Only take over-the-counter or prescription medicine for pain, discomfort, or fever as directed by your caregiver.  Follow your caregiver's suggestions for further testing if your chest pain does not go away.  Keep any follow-up appointments you made. If you do not go to an appointment, you could develop lasting (chronic) problems with pain. If there is any problem keeping an appointment, you must call to reschedule. SEEK MEDICAL CARE IF:   You think you are having problems from the medicine you are taking. Read your medicine instructions carefully.  Your chest pain does not go away, even after treatment.  You develop a rash with blisters on your chest. SEEK IMMEDIATE MEDICAL CARE IF:   You have increased chest pain or pain that spreads to your arm, neck, jaw, back, or abdomen.  You develop shortness of breath, an increasing cough, or you are coughing up blood.  You have severe back or abdominal pain, feel nauseous, or vomit.  You develop severe weakness, fainting, or chills.  You have a fever. THIS IS AN EMERGENCY. Do not wait to see if the pain will go away. Get medical help at once. Call your local emergency services (911 in [place] Do not drive yourself to the hospital. MAKE SURE YOU:   Understand these instructions.  Will watch your condition.  Will get help right away if you are not doing well or get worse. Document Released: 09/04/2005 Document Revised: 02/17/2012 Document Reviewed: 06/30/2008 Exit[employer] Patient Information 2013 [employer].  

## 2012-10-14 ENCOUNTER — Encounter: Payer: Self-pay | Admitting: *Deleted

## 2012-10-14 DIAGNOSIS — Z0000 Encounter for general adult medical examination without abnormal findings: Secondary | ICD-10-CM | POA: Diagnosis not present

## 2012-10-15 ENCOUNTER — Encounter: Payer: Self-pay | Admitting: Family Medicine

## 2012-10-15 LAB — COMPREHENSIVE METABOLIC PANEL
AST: 19 U/L (ref 0–37)
BUN: 19 mg/dL (ref 6–23)
CO2: 26 mEq/L (ref 19–32)
Calcium: 9.4 mg/dL (ref 8.4–10.5)
Chloride: 103 mEq/L (ref 96–112)
Creat: 0.68 mg/dL (ref 0.50–1.35)

## 2012-10-15 LAB — CBC WITH DIFFERENTIAL/PLATELET
Basophils Absolute: 0 10*3/uL (ref 0.0–0.1)
Eosinophils Relative: 2 % (ref 0–5)
HCT: 48.4 % (ref 39.0–52.0)
Lymphocytes Relative: 27 % (ref 12–46)
MCV: 88.2 fL (ref 78.0–100.0)
Monocytes Absolute: 0.5 10*3/uL (ref 0.1–1.0)
RDW: 14.5 % (ref 11.5–15.5)
WBC: 7 10*3/uL (ref 4.0–10.5)

## 2012-10-15 LAB — LIPID PANEL
Cholesterol: 206 mg/dL — ABNORMAL HIGH (ref 0–200)
HDL: 58 mg/dL (ref >39)
Triglycerides: 103 mg/dL (ref <150)

## 2012-10-15 LAB — TSH: TSH: 1.151 u[IU]/mL (ref 0.350–4.500)

## 2012-10-16 DIAGNOSIS — R079 Chest pain, unspecified: Secondary | ICD-10-CM | POA: Diagnosis not present

## 2013-03-19 ENCOUNTER — Encounter: Payer: Self-pay | Admitting: Gastroenterology

## 2013-04-12 ENCOUNTER — Ambulatory Visit: Payer: Medicare Other | Admitting: Family Medicine

## 2013-05-05 ENCOUNTER — Encounter: Payer: Self-pay | Admitting: Gastroenterology

## 2013-05-20 DIAGNOSIS — R319 Hematuria, unspecified: Secondary | ICD-10-CM | POA: Diagnosis not present

## 2013-05-20 DIAGNOSIS — N390 Urinary tract infection, site not specified: Secondary | ICD-10-CM | POA: Diagnosis not present

## 2013-05-24 DIAGNOSIS — R319 Hematuria, unspecified: Secondary | ICD-10-CM | POA: Diagnosis not present

## 2013-05-24 DIAGNOSIS — N390 Urinary tract infection, site not specified: Secondary | ICD-10-CM | POA: Diagnosis not present

## 2013-05-25 DIAGNOSIS — N3000 Acute cystitis without hematuria: Secondary | ICD-10-CM | POA: Diagnosis not present

## 2013-05-25 DIAGNOSIS — R319 Hematuria, unspecified: Secondary | ICD-10-CM | POA: Diagnosis not present

## 2013-05-28 DIAGNOSIS — R319 Hematuria, unspecified: Secondary | ICD-10-CM | POA: Diagnosis not present

## 2013-06-08 DIAGNOSIS — M7700 Medial epicondylitis, unspecified elbow: Secondary | ICD-10-CM | POA: Diagnosis not present

## 2013-06-17 DIAGNOSIS — N401 Enlarged prostate with lower urinary tract symptoms: Secondary | ICD-10-CM | POA: Diagnosis not present

## 2013-06-17 DIAGNOSIS — N411 Chronic prostatitis: Secondary | ICD-10-CM | POA: Diagnosis not present

## 2013-06-17 DIAGNOSIS — R310 Gross hematuria: Secondary | ICD-10-CM | POA: Diagnosis not present

## 2013-06-17 DIAGNOSIS — C679 Malignant neoplasm of bladder, unspecified: Secondary | ICD-10-CM | POA: Diagnosis not present

## 2013-06-30 DIAGNOSIS — R310 Gross hematuria: Secondary | ICD-10-CM | POA: Diagnosis not present

## 2013-06-30 DIAGNOSIS — N401 Enlarged prostate with lower urinary tract symptoms: Secondary | ICD-10-CM | POA: Diagnosis not present

## 2013-06-30 DIAGNOSIS — N529 Male erectile dysfunction, unspecified: Secondary | ICD-10-CM | POA: Diagnosis not present

## 2013-07-05 ENCOUNTER — Ambulatory Visit (AMBULATORY_SURGERY_CENTER): Payer: Medicare Other

## 2013-07-05 VITALS — Ht 62.0 in | Wt 144.0 lb

## 2013-07-05 DIAGNOSIS — Z860100 Personal history of colon polyps, unspecified: Secondary | ICD-10-CM

## 2013-07-05 MED ORDER — MOVIPREP 100 G PO SOLR: 1.0000 | Freq: Once | ORAL | Status: DC

## 2013-07-06 ENCOUNTER — Other Ambulatory Visit: Payer: Self-pay | Admitting: Urology

## 2013-07-09 ENCOUNTER — Encounter (HOSPITAL_BASED_OUTPATIENT_CLINIC_OR_DEPARTMENT_OTHER): Payer: Self-pay | Admitting: *Deleted

## 2013-07-12 ENCOUNTER — Encounter (HOSPITAL_BASED_OUTPATIENT_CLINIC_OR_DEPARTMENT_OTHER): Payer: Self-pay | Admitting: *Deleted

## 2013-07-15 ENCOUNTER — Encounter (HOSPITAL_BASED_OUTPATIENT_CLINIC_OR_DEPARTMENT_OTHER): Payer: Self-pay | Admitting: *Deleted

## 2013-07-15 NOTE — Progress Notes (Signed)
NPO AFTER MN. ARRIVES AT 0745. NEEDS HG. CURRENT EKG IN EPIC AND CHART.

## 2013-07-19 ENCOUNTER — Encounter (HOSPITAL_BASED_OUTPATIENT_CLINIC_OR_DEPARTMENT_OTHER): Payer: Self-pay

## 2013-07-19 ENCOUNTER — Encounter (HOSPITAL_BASED_OUTPATIENT_CLINIC_OR_DEPARTMENT_OTHER)
Admission: RE | Disposition: A | Discharge status: Home / Self Care | Payer: Self-pay | Source: Ambulatory Visit | Attending: Urology

## 2013-07-19 ENCOUNTER — Ambulatory Visit (HOSPITAL_BASED_OUTPATIENT_CLINIC_OR_DEPARTMENT_OTHER)
Admission: RE | Admit: 2013-07-19 | Discharge: 2013-07-19 | Disposition: A | Discharge status: Home / Self Care | Payer: Medicare Other | Source: Ambulatory Visit | Attending: Urology | Admitting: Urology

## 2013-07-19 ENCOUNTER — Ambulatory Visit (HOSPITAL_BASED_OUTPATIENT_CLINIC_OR_DEPARTMENT_OTHER): Payer: Medicare Other | Admitting: Anesthesiology

## 2013-07-19 ENCOUNTER — Encounter (HOSPITAL_BASED_OUTPATIENT_CLINIC_OR_DEPARTMENT_OTHER): Payer: Self-pay | Admitting: Anesthesiology

## 2013-07-19 DIAGNOSIS — E7800 Pure hypercholesterolemia, unspecified: Secondary | ICD-10-CM | POA: Insufficient documentation

## 2013-07-19 DIAGNOSIS — N401 Enlarged prostate with lower urinary tract symptoms: Secondary | ICD-10-CM | POA: Insufficient documentation

## 2013-07-19 DIAGNOSIS — N139 Obstructive and reflux uropathy, unspecified: Secondary | ICD-10-CM | POA: Diagnosis not present

## 2013-07-19 DIAGNOSIS — N3000 Acute cystitis without hematuria: Secondary | ICD-10-CM | POA: Insufficient documentation

## 2013-07-19 DIAGNOSIS — Z7982 Long term (current) use of aspirin: Secondary | ICD-10-CM | POA: Diagnosis not present

## 2013-07-19 DIAGNOSIS — N138 Other obstructive and reflux uropathy: Secondary | ICD-10-CM | POA: Insufficient documentation

## 2013-07-19 DIAGNOSIS — N3020 Other chronic cystitis without hematuria: Secondary | ICD-10-CM | POA: Diagnosis not present

## 2013-07-19 DIAGNOSIS — N529 Male erectile dysfunction, unspecified: Secondary | ICD-10-CM | POA: Diagnosis not present

## 2013-07-19 DIAGNOSIS — R310 Gross hematuria: Secondary | ICD-10-CM | POA: Diagnosis not present

## 2013-07-19 DIAGNOSIS — N3289 Other specified disorders of bladder: Secondary | ICD-10-CM | POA: Diagnosis not present

## 2013-07-19 DIAGNOSIS — R319 Hematuria, unspecified: Secondary | ICD-10-CM | POA: Diagnosis not present

## 2013-07-19 HISTORY — DX: Benign prostatic hyperplasia without lower urinary tract symptoms: N40.0

## 2013-07-19 HISTORY — PX: CYSTOSCOPY WITH BIOPSY: SHX5122

## 2013-07-19 HISTORY — DX: Unspecified osteoarthritis, unspecified site: M19.90

## 2013-07-19 HISTORY — DX: Nocturia: R35.1

## 2013-07-19 LAB — POCT HEMOGLOBIN-HEMACUE: Hemoglobin: 16.7 g/dL (ref 13.0–17.0)

## 2013-07-19 SURGERY — CYSTOSCOPY, WITH BIOPSY
Anesthesia: General | Site: Bladder | Wound class: Clean Contaminated

## 2013-07-19 MED ORDER — LACTATED RINGERS IV SOLN
INTRAVENOUS | Status: DC
  Administered 2013-07-19: 08:00:00 via INTRAVENOUS
  Filled 2013-07-19: qty 1000

## 2013-07-19 MED ORDER — KETOROLAC TROMETHAMINE 30 MG/ML IJ SOLN
INTRAMUSCULAR | Status: DC | PRN
  Administered 2013-07-19: 30 mg via INTRAVENOUS

## 2013-07-19 MED ORDER — FENTANYL CITRATE 0.05 MG/ML IJ SOLN
INTRAMUSCULAR | Status: DC | PRN
  Administered 2013-07-19 (×4): 25 ug via INTRAVENOUS

## 2013-07-19 MED ORDER — DEXTROSE 5 % IV SOLN
1.0000 g | INTRAVENOUS | Status: DC | PRN
  Administered 2013-07-19: 1 g via INTRAVENOUS

## 2013-07-19 MED ORDER — DEXTROSE 5 % IV SOLN
1.0000 g | Freq: Once | INTRAVENOUS | Status: DC
  Filled 2013-07-19: qty 10

## 2013-07-19 MED ORDER — LIDOCAINE HCL 2 % EX GEL
CUTANEOUS | Status: DC | PRN
  Administered 2013-07-19: 1

## 2013-07-19 MED ORDER — STERILE WATER FOR IRRIGATION IR SOLN
Status: DC | PRN
  Administered 2013-07-19: 3000 mL via INTRAVESICAL

## 2013-07-19 MED ORDER — MEPERIDINE HCL 25 MG/ML IJ SOLN
6.2500 mg | INTRAMUSCULAR | Status: DC | PRN
  Filled 2013-07-19: qty 1

## 2013-07-19 MED ORDER — DEXAMETHASONE SODIUM PHOSPHATE 4 MG/ML IJ SOLN
INTRAMUSCULAR | Status: DC | PRN
  Administered 2013-07-19: 10 mg via INTRAVENOUS

## 2013-07-19 MED ORDER — PROPOFOL 10 MG/ML IV BOLUS
INTRAVENOUS | Status: DC | PRN
  Administered 2013-07-19: 200 mg via INTRAVENOUS

## 2013-07-19 MED ORDER — FENTANYL CITRATE 0.05 MG/ML IJ SOLN
25.0000 ug | INTRAMUSCULAR | Status: DC | PRN
  Filled 2013-07-19: qty 1

## 2013-07-19 MED ORDER — LACTATED RINGERS IV SOLN
INTRAVENOUS | Status: DC | PRN
  Administered 2013-07-19 (×2): via INTRAVENOUS

## 2013-07-19 MED ORDER — ONDANSETRON HCL 4 MG/2ML IJ SOLN
INTRAMUSCULAR | Status: DC | PRN
  Administered 2013-07-19: 4 mg via INTRAVENOUS

## 2013-07-19 MED ORDER — MIDAZOLAM HCL 5 MG/5ML IJ SOLN
INTRAMUSCULAR | Status: DC | PRN
  Administered 2013-07-19 (×2): 1 mg via INTRAVENOUS

## 2013-07-19 MED ORDER — LIDOCAINE HCL (CARDIAC) 20 MG/ML IV SOLN
INTRAVENOUS | Status: DC | PRN
  Administered 2013-07-19: 80 mg via INTRAVENOUS

## 2013-07-19 MED ORDER — PROMETHAZINE HCL 25 MG/ML IJ SOLN
6.2500 mg | INTRAMUSCULAR | Status: DC | PRN
  Filled 2013-07-19: qty 1

## 2013-07-19 SURGICAL SUPPLY — 35 items
BAG DRAIN URO-CYSTO SKYTR STRL (DRAIN) ×3 IMPLANT
BAG URINE DRAINAGE (UROLOGICAL SUPPLIES) IMPLANT
BAG URINE LEG 19OZ MD ST LTX (BAG) IMPLANT
CANISTER SUCT LVC 12 LTR MEDI- (MISCELLANEOUS) IMPLANT
CATH FOLEY 2WAY SLVR  5CC 20FR (CATHETERS)
CATH FOLEY 2WAY SLVR  5CC 22FR (CATHETERS)
CATH FOLEY 2WAY SLVR 5CC 20FR (CATHETERS) IMPLANT
CATH FOLEY 2WAY SLVR 5CC 22FR (CATHETERS) IMPLANT
CATH ROBINSON RED A/P 14FR (CATHETERS) IMPLANT
CATH ROBINSON RED A/P 16FR (CATHETERS) IMPLANT
CLOTH BEACON ORANGE TIMEOUT ST (SAFETY) ×3 IMPLANT
DRAPE CAMERA CLOSED 9X96 (DRAPES) ×3 IMPLANT
ELECT BUTTON BIOP 24F 90D PLAS (MISCELLANEOUS) IMPLANT
ELECT LOOP HF 26F 30D .35MM (CUTTING LOOP) IMPLANT
ELECT NEEDLE 45D HF 24-28F 12D (CUTTING LOOP) IMPLANT
ELECT REM PT RETURN 9FT ADLT (ELECTROSURGICAL) ×3
ELECTRODE REM PT RTRN 9FT ADLT (ELECTROSURGICAL) ×2 IMPLANT
EVACUATOR MICROVAS BLADDER (UROLOGICAL SUPPLIES) IMPLANT
GLOVE BIO SURGEON STRL SZ7.5 (GLOVE) ×3 IMPLANT
GOWN STRL REIN XL XLG (GOWN DISPOSABLE) ×3 IMPLANT
GOWN XL W/COTTON TOWEL STD (GOWNS) ×3 IMPLANT
HOLDER FOLEY CATH W/STRAP (MISCELLANEOUS) IMPLANT
KIT ASPIRATION TUBING (SET/KITS/TRAYS/PACK) IMPLANT
LOOP CUTTING 24FR OLYMPUS (CUTTING LOOP) ×3 IMPLANT
LOOP ELECTRODE 28FR (MISCELLANEOUS) IMPLANT
NDL SAFETY ECLIPSE 18X1.5 (NEEDLE) IMPLANT
NEEDLE HYPO 18GX1.5 SHARP (NEEDLE)
NEEDLE HYPO 22GX1.5 SAFETY (NEEDLE) IMPLANT
NS IRRIG 500ML POUR BTL (IV SOLUTION) IMPLANT
PACK CYSTOSCOPY (CUSTOM PROCEDURE TRAY) ×3 IMPLANT
PLUG CATH AND CAP STER (CATHETERS) IMPLANT
SET ASPIRATION TUBING (TUBING) IMPLANT
SYR 20CC LL (SYRINGE) IMPLANT
SYR BULB IRRIGATION 50ML (SYRINGE) IMPLANT
WATER STERILE IRR 3000ML UROMA (IV SOLUTION) ×3 IMPLANT

## 2013-07-19 NOTE — Anesthesia Procedure Notes (Signed)
Procedure Name: LMA Insertion Date/Time: 07/19/2013 10:28 AM Performed by: Jessica Priest Pre-anesthesia Checklist: Patient identified, Emergency Drugs available, Suction available and Patient being monitored Patient Re-evaluated:Patient Re-evaluated prior to inductionOxygen Delivery Method: Circle System Utilized Preoxygenation: Pre-oxygenation with 100% oxygen Intubation Type: IV induction Ventilation: Mask ventilation without difficulty LMA: LMA inserted LMA Size: 3.0 Number of attempts: 1 Airway Equipment and Method: bite block Placement Confirmation: positive ETCO2 Tube secured with: Tape Dental Injury: Teeth and Oropharynx as per pre-operative assessment

## 2013-07-19 NOTE — Transfer of Care (Signed)
Immediate Anesthesia Transfer of Care Note  Patient: Jim Tucker  Procedure(s) Performed: Procedure(s) (LRB): TRANSURETHRAL RESECTION OF BLADDER NECK (N/A)  Patient Location: PACU  Anesthesia Type: General  Level of Consciousness: awake, sedated, patient cooperative and responds to stimulation  Airway & Oxygen Therapy: Patient Spontanous Breathing and Patient connected to face mask oxygen  Post-op Assessment: Report given to PACU RN, Post -op Vital signs reviewed and stable and Patient moving all extremities  Post vital signs: Reviewed and stable  Complications: No apparent anesthesia complications

## 2013-07-19 NOTE — Anesthesia Preprocedure Evaluation (Addendum)
Anesthesia Evaluation  Patient identified by MRN, date of birth, ID band Patient awake    Reviewed: Allergy & Precautions, H&P , NPO status , Patient's Chart, lab work & pertinent test results  Airway Mallampati: II TM Distance: >3 FB Neck ROM: Full    Dental no notable dental hx.    Pulmonary neg pulmonary ROS,  breath sounds clear to auscultation  Pulmonary exam normal       Cardiovascular negative cardio ROS  - dysrhythmias Rhythm:Regular Rate:Normal     Neuro/Psych negative neurological ROS  negative psych ROS   GI/Hepatic negative GI ROS, Neg liver ROS,   Endo/Other  negative endocrine ROS  Renal/GU negative Renal ROS  negative genitourinary   Musculoskeletal negative musculoskeletal ROS (+)   Abdominal   Peds negative pediatric ROS (+)  Hematology negative hematology ROS (+)   Anesthesia Other Findings   Reproductive/Obstetrics negative OB ROS                           Anesthesia Physical Anesthesia Plan  ASA: II  Anesthesia Plan: General   Post-op Pain Management:    Induction: Intravenous  Airway Management Planned: LMA  Additional Equipment:   Intra-op Plan:   Post-operative Plan:   Informed Consent: I have reviewed the patients History and Physical, chart, labs and discussed the procedure including the risks, benefits and alternatives for the proposed anesthesia with the patient or authorized representative who has indicated his/her understanding and acceptance.   Dental advisory given  Plan Discussed with: CRNA  Anesthesia Plan Comments:         Anesthesia Quick Evaluation

## 2013-07-19 NOTE — H&P (Signed)
Reason For Visit   Geovanni returns for followup. Again, he was seen fairly recently with a history summarized in the paragraph below. He still had some mild discomfort with voiding but his urinalysis now is clear. His urine culture from approximately 2 weeks ago was positive for E coli. He is currently finishing a 2-week course of amoxicillin. Again, he presents now for further assessment of a questionable abnormality within his prostatic fossa. He had a clear urine today without microhematuria.      History of Present Illness             Aydien returns for  follow-up. He was last seen in November 2013 and was scheduled for annual followup. He was however evaluated by a urologist in [place] after developing an episode of gross hematuria. He underwent a office cystoscopy which apparently revealed a papillary abnormality within the prostatic fossa. It was suggested that he have a followup cystoscopy here in our office. That papillary abnormality was not resected were biopsied. The patient was noted to have some Escherichia coli on urine culture and he was treated with antibiotic therapy. The rest of the bladder apparently looked normal. Past GU history: The patient underwent a TURP in the fall of 2010. Initially he was not satisfied with the results but more recently has done quite well and has been pleased with the ultimate outcome. He is having no complications or problems.  Good urinary stream and no other issues.  PSA in 2012 was down very nicely to 0.42.  He continues to use Levitra with good success. Has cost concerns.       Past Medical History Problems  1. History of  Arthritis V13.4 2. History of  Hypercholesterolemia 272.0  Surgical History Problems  1. History of  Transurethral Resection Of Prostate (TURP)  Current Meds 1. Adult Aspirin Low Strength 81 MG Oral Tablet Dispersible; Therapy: (Recorded:10Jul2014) to 2. Amoxicillin 500 MG Oral Capsule; TAKE 1 CAPSULE TWICE  DAILY; Therapy: 10Jul2014 to  (Evaluate:21Jul2014)  Requested for: 14Jul2014; Last Rx:14Jul2014 3. Pravastatin Sodium 80 MG Oral Tablet; Therapy: 17Aug2010 to  Allergies Medication  1. No Known Drug Allergies  Family History Problems  1. Family history of  Death In The Family Father 83's and unknown COD 2. Family history of  Death In The Family Mother early 1's complications from labor and delivery 3. Family history of  Family Health Status Number Of Children 1 son and 1 daughter  Social History Problems  1. Being A Social Drinker 2. Caffeine Use coffee and in am and pm 3. Exercise Habits He states he works out every day at the gym for approximately one hour on the treadmill, doing sit-ups, and working out on the [employer]. 4. Marital History - Currently Married 5. Occupation: retired 6. Self-reliant In Usual Daily Activities 7. Tobacco Use V15.82 2 cig a day  Review of Systems Genitourinary, constitutional, skin, eye, otolaryngeal, hematologic/lymphatic, cardiovascular, pulmonary, endocrine, musculoskeletal, gastrointestinal, neurological and psychiatric system(s) were reviewed and pertinent findings if present are noted.  Genitourinary: dysuria, but no urinary frequency, urine stream is not weak, urinary stream does not start and stop, no incomplete emptying of bladder and no hematuria.  ENT: sinus problems.    Vitals Vital Signs [Data Includes: Last 1 Day]  23Jul2014 09:35AM  Blood Pressure: 146 / 85 Temperature: 97.5 F Heart Rate: 72  Physical Exam Constitutional: Well nourished and well developed . No acute distress.  Pulmonary: No respiratory distress and normal respiratory rhythm and effort.  Cardiovascular: Heart rate and rhythm are normal . No peripheral edema.  Abdomen: The abdomen is soft and nontender. No masses are palpated. No CVA tenderness. No hernias are palpable. No hepatosplenomegaly noted.  Genitourinary: Examination of the penis demonstrates  no discharge, no masses, no lesions and a normal meatus. The scrotum is without lesions. The right epididymis is palpably normal and non-tender. The left epididymis is palpably normal and non-tender. The right testis is non-tender and without masses. The left testis is non-tender and without masses.    Results/Data Urine [Data Includes: Last 1 Day]   23Jul2014  COLOR YELLOW   APPEARANCE CLEAR   SPECIFIC GRAVITY 1.020   pH 5.5   GLUCOSE NEG mg/dL  BILIRUBIN NEG   KETONE NEG mg/dL  BLOOD NEG   PROTEIN NEG mg/dL  UROBILINOGEN 0.2 mg/dL  NITRITE NEG   LEUKOCYTE ESTERASE NEG    Procedure  Procedure: Cystoscopy  Chaperone Present: brandy.  Indication: Hematuria. Lower Urinary Tract Symptoms. History of Urothelial Carcinoma.  Informed Consent: Risks, benefits, and potential adverse events were discussed and informed consent was obtained from the patient . Specific risks including, but not limited to bleeding, infection, pain, allergic reaction etc. were explained.  Prep: The patient was prepped with betadine.  Anesthesia:. Local anesthesia was administered intraurethrally with 2% lidocaine jelly.  Antibiotic prophylaxis:. Amox.  Procedure Note:  Urethral meatus:. No abnormalities.  Anterior urethra: No abnormalities.  Prostatic urethra:. Was identified.  Bladder: Visulization was clear. A systematic survey of the bladder demonstrated no bladder tumors or stones. The patient tolerated the procedure well.  Complications: None.    Assessment Assessed  1. Gross Hematuria 599.71 2. Possible  Bladder Cancer 188.9 3. Benign Prostatic Hypertrophy With Urinary Obstruction 600.01 4. Male Erectile Disorder Due To Physical Condition 607.84  Plan Benign Prostatic Hypertrophy With Urinary Obstruction (600.01)  1. Follow-up Schedule Surgery Office  Follow-up  Requested for: 23Jul2014 Chronic Bacterial Prostatitis (601.1)  2. Cysto Gross Hematuria (599.71)  3. Cysto  Done: 23Jul2014 Health  Maintenance (V70.0)  4. UA With REFLEX  Done: 23Jul2014 09:28AM Male Erectile Disorder Due To Physical Condition (607.84)  5. Levitra 20 MG Oral Tablet; TAKE 1 TABLET As Directed; Therapy: 23Jul2014 to (Last  Rx:23Jul2014)  Discussion/Summary   At this point, Tyan does appear to have had resolution of his URI/prostatitis. His urine is clear but he is still having some lingering discomfort with voiding. The outside urologist noticed some abnormal tissue/mucosa within the prostatic fossa near the bladder neck. I concur today. I suspect this is inflammatory, but a transitional cell carcinoma cannot be ruled out. For that reason, I do feel that it would be safest to go ahead and do a biopsy of that mucosa. We will also send NMP22 urine testing today. We may need to do a little TUR right of the bottom part of the prostatic fossa to get good adequate tissue. We will also reinvestigate the bladder, but there was no definitive tumor within the bladder. He otherwise is voiding well. Visually there is no evidence of obstruction and the TUR seems to have opened him up extremely well. He wants a Levitra prescription, which we will renew today. We will try to set up his procedure for sometime in the next couple of weeks.    Signatures Electronically signed by : Barron Alvine, M.D.; Jun 30 2013 12:25PM

## 2013-07-19 NOTE — Op Note (Signed)
Preoperative diagnosis: Question bladder tumor at bladder neck Postoperative diagnosis: Same  Procedure: Cystoscopy, TURBT resection bladder neck/bladder   Surgeon: Valetta Fuller M.D.  Anesthesia: Gen.  Indications: Jim Tucker is status post a TURP in the past for significant voiding symptoms and has done extremely well clinically. He recently had some hematuria was evaluated in [place]. There the urologist was concerned about a possible papillary abnormality/tumor within the prostatic fossa near the bladder neck. On repeat evaluation in our office the patient did have some abnormal appearing mucosa. It appeared to be more consistent with inflammation than malignancy but it was difficult to rule out the possibility of cancer. Presents now for resection/biopsy of this abnormality.     Technique and findings: Patient was brought the operating room where he had successful induction general anesthesia. The patient had perioperative antibiotics. Appropriate surgical timeout was performed. The patient was placed in lithotomy position and prepped and draped in usual manner. Cystoscopy revealed a very well resected prostatic fossa with no evidence of obstruction stricture or bladder neck contracture. The bladder was panendoscoped and found to be normal. Within the prostatic fossa right at the bladder neck there were subtle areas of mucosal thickening and slight urothelium a. Again this appeared probably inflammatory and had the appearance of cystitis cystica about carcinoma in situ and/or papillary transitional cell carcinoma could not be completely excluded. A 20 French continuous flow resectoscope was inserted. 2 TUR resections were performed of this abnormal mucosa. This will be sent for permanent pathologic analysis. Hemostasis was excellent. Lidocaine was instilled. We did not feel there was a need for catheter drainage.

## 2013-07-19 NOTE — Interval H&P Note (Signed)
History and Physical Interval Note:  07/19/2013 7:53 AM  Jim Tucker  has presented today for surgery, with the diagnosis of HEMATURIA, POSSIBLE BLADDER TUMOR  The various methods of treatment have been discussed with the patient and family. After consideration of risks, benefits and other options for treatment, the patient has consented to  Procedure(s) with comments: CYSTOSCOPY WITH BIOPSY (N/A) - 30 MIN TRANSURETHRAL RESECTION OF BLADDER TUMOR (TURBT) (N/A) as a surgical intervention .  The patient's history has been reviewed, patient examined, no change in status, stable for surgery.  I have reviewed the patient's chart and labs.  Questions were answered to the patient's satisfaction.     [name]

## 2013-07-20 ENCOUNTER — Encounter (HOSPITAL_BASED_OUTPATIENT_CLINIC_OR_DEPARTMENT_OTHER): Payer: Self-pay | Admitting: Urology

## 2013-07-20 NOTE — Anesthesia Postprocedure Evaluation (Signed)
  Anesthesia Post-op Note  Patient: Jim Tucker  Procedure(s) Performed: Procedure(s) (LRB): TRANSURETHRAL RESECTION OF BLADDER NECK (N/A)  Patient Location: PACU  Anesthesia Type: MAC  Level of Consciousness: awake and alert   Airway and Oxygen Therapy: Patient Spontanous Breathing  Post-op Pain: mild  Post-op Assessment: Post-op Vital signs reviewed, Patient's Cardiovascular Status Stable, Respiratory Function Stable, Patent Airway and No signs of Nausea or vomiting  Last Vitals:  Filed Vitals:   07/19/13 1330  BP: 145/75  Pulse: 54  Temp: 36.1 C  Resp: 18    Post-op Vital Signs: stable   Complications: No apparent anesthesia complications

## 2013-07-26 DIAGNOSIS — N390 Urinary tract infection, site not specified: Secondary | ICD-10-CM | POA: Diagnosis not present

## 2013-07-26 DIAGNOSIS — R300 Dysuria: Secondary | ICD-10-CM | POA: Diagnosis not present

## 2013-07-27 ENCOUNTER — Telehealth: Payer: Self-pay | Admitting: Gastroenterology

## 2013-07-27 NOTE — Telephone Encounter (Signed)
Spoke with pt regarding what vegetables he could eat, and if he could have jelly. I reviewed all the foods he should avoid for 5 days and answered his questions.He will call back if he has further questions.

## 2013-07-30 DIAGNOSIS — R310 Gross hematuria: Secondary | ICD-10-CM | POA: Diagnosis not present

## 2013-07-30 DIAGNOSIS — R300 Dysuria: Secondary | ICD-10-CM | POA: Diagnosis not present

## 2013-07-30 DIAGNOSIS — N401 Enlarged prostate with lower urinary tract symptoms: Secondary | ICD-10-CM | POA: Diagnosis not present

## 2013-08-02 ENCOUNTER — Encounter: Payer: Self-pay | Admitting: Gastroenterology

## 2013-08-02 ENCOUNTER — Ambulatory Visit (AMBULATORY_SURGERY_CENTER): Payer: Medicare Other | Admitting: Gastroenterology

## 2013-08-02 VITALS — BP 122/67 | HR 48 | Temp 97.7°F | Resp 27 | Ht 62.0 in | Wt 144.0 lb

## 2013-08-02 DIAGNOSIS — Z860100 Personal history of colon polyps, unspecified: Secondary | ICD-10-CM

## 2013-08-02 DIAGNOSIS — D126 Benign neoplasm of colon, unspecified: Secondary | ICD-10-CM

## 2013-08-02 DIAGNOSIS — F959 Tic disorder, unspecified: Secondary | ICD-10-CM | POA: Diagnosis not present

## 2013-08-02 DIAGNOSIS — I499 Cardiac arrhythmia, unspecified: Secondary | ICD-10-CM | POA: Diagnosis not present

## 2013-08-02 DIAGNOSIS — K649 Unspecified hemorrhoids: Secondary | ICD-10-CM | POA: Diagnosis not present

## 2013-08-02 MED ORDER — SODIUM CHLORIDE 0.9 % IV SOLN: 500.0000 mL | INTRAVENOUS | Status: DC

## 2013-08-02 NOTE — Progress Notes (Signed)
Called to room to assist during endoscopic procedure.  Patient ID and intended procedure confirmed with present staff. Received instructions for my participation in the procedure from the performing physician.  

## 2013-08-02 NOTE — Op Note (Signed)
[facility] [address] [place], [address]   COLONOSCOPY PROCEDURE REPORT  PATIENT: Jim, Tucker  MR#: [id number] BIRTHDATE: 09-30-1941 , 72  yrs. old GENDER: Male ENDOSCOPIST: Meryl Dare, MD, [facility] PROCEDURE DATE:  08/02/2013 PROCEDURE:   Colonoscopy with snare polypectomy First Screening Colonoscopy - Avg.  risk and is 50 yrs.  old or older - No.  Prior Negative Screening - Now for repeat screening. N/A  History of Adenoma - Now for follow-up colonoscopy & has been > or = to 3 yrs.  Yes hx of adenoma.  Has been 3 or more years since last colonoscopy.  Polyps Removed Today? Yes. ASA CLASS:   Class II INDICATIONS:Patient's personal history of adenomatous colon polyps.  MEDICATIONS: MAC sedation, administered by CRNA and propofol (Diprivan) 140mg  IV DESCRIPTION OF PROCEDURE:   After the risks benefits and alternatives of the procedure were thoroughly explained, informed consent was obtained.  A digital rectal exam revealed no abnormalities of the rectum.   The LB [id number]  endoscope was introduced through the anus and advanced to the cecum, which was identified by both the appendix and ileocecal valve. No adverse events experienced.   The quality of the prep was excellent, using MoviPrep  The instrument was then slowly withdrawn as the colon was fully examined.  COLON FINDINGS: A sessile polyp measuring 6 mm in size was found in the transverse colon.  A polypectomy was performed with a cold snare.  The resection was complete and the polyp tissue was completely retrieved.   Mild diverticulosis was noted in the sigmoid colon.   The colon was otherwise normal.  There was no diverticulosis, inflammation, polyps or cancers unless previously stated.  Retroflexed views revealed small internal hemorrhoids. The time to cecum=1 minutes 18 seconds.  Withdrawal time=9 minutes 00 seconds.  The scope was withdrawn and the procedure  completed. COMPLICATIONS: There were no complications.  ENDOSCOPIC IMPRESSION: 1.   Sessile polyp measuring 6 mm in the transverse colon; polypectomy performed with a cold snare 2.   Mild diverticulosis was noted in the sigmoid colon 3.   Small internal hemorrhoids  RECOMMENDATIONS: 1.  Await pathology results 2.  High fiber diet with liberal fluid intake. 3.  Repeat Colonoscopy in 5 years.  eSigned:  Meryl Dare, MD, [facility] 08/02/2013 10:18 AM   cc: Seymour Bars, DO

## 2013-08-02 NOTE — Patient Instructions (Addendum)
Discharge instructions given with verbal understanding. Handouts on polyps,diverticulosis and hemorrhoids given. Resume previous medications. YOU HAD AN ENDOSCOPIC PROCEDURE TODAY AT THE [facility]: Refer to the procedure report that was given to you for any specific questions about what was found during the examination.  If the procedure report does not answer your questions, please call your gastroenterologist to clarify.  If you requested that your care partner not be given the details of your procedure findings, then the procedure report has been included in a sealed envelope for you to review at your convenience later.  YOU SHOULD EXPECT: Some feelings of bloating in the abdomen. Passage of more gas than usual.  Walking can help get rid of the air that was put into your GI tract during the procedure and reduce the bloating. If you had a lower endoscopy (such as a colonoscopy or flexible sigmoidoscopy) you may notice spotting of blood in your stool or on the toilet paper. If you underwent a bowel prep for your procedure, then you may not have a normal bowel movement for a few days.  DIET: Your first meal following the procedure should be a light meal and then it is ok to progress to your normal diet.  A half-sandwich or bowl of soup is an example of a good first meal.  Heavy or fried foods are harder to digest and may make you feel nauseous or bloated.  Likewise meals heavy in dairy and vegetables can cause extra gas to form and this can also increase the bloating.  Drink plenty of fluids but you should avoid alcoholic beverages for 24 hours.  ACTIVITY: Your care partner should take you home directly after the procedure.  You should plan to take it easy, moving slowly for the rest of the day.  You can resume normal activity the day after the procedure however you should NOT DRIVE or use heavy machinery for 24 hours (because of the sedation medicines used during the test).    SYMPTOMS TO  REPORT IMMEDIATELY: A gastroenterologist can be reached at any hour.  During normal business hours, 8:30 AM to 5:00 PM Monday through Friday, call [phone].  After hours and on weekends, please call the GI answering service at [phone] who will take a message and have the physician on call contact you.   Following lower endoscopy (colonoscopy or flexible sigmoidoscopy):  Excessive amounts of blood in the stool  Significant tenderness or worsening of abdominal pains  Swelling of the abdomen that is new, acute  Fever of 100F or higher  FOLLOW UP: If any biopsies were taken you will be contacted by phone or by letter within the next 1-3 weeks.  Call your gastroenterologist if you have not heard about the biopsies in 3 weeks.  Our staff will call the home number listed on your records the next business day following your procedure to check on you and address any questions or concerns that you may have at that time regarding the information given to you following your procedure. This is a courtesy call and so if there is no answer at the home number and we have not heard from you through the emergency physician on call, we will assume that you have returned to your regular daily activities without incident.  SIGNATURES/CONFIDENTIALITY: You and/or your care partner have signed paperwork which will be entered into your electronic medical record.  These signatures attest to the fact that that the information above on your After Visit   Summary has been reviewed and is understood.  Full responsibility of the confidentiality of this discharge information lies with you and/or your care-partner.  

## 2013-08-02 NOTE — Progress Notes (Signed)
Patient did not experience any of the following events: a burn prior to discharge; a fall within the facility; wrong site/side/patient/procedure/implant event; or a hospital transfer or hospital admission upon discharge from the facility. (G8907) Patient did not have preoperative order for IV antibiotic SSI prophylaxis. (G8918)  

## 2013-08-03 ENCOUNTER — Telehealth: Payer: Self-pay

## 2013-08-03 NOTE — Telephone Encounter (Signed)
  Follow up Call-  Call back number 08/02/2013  Post procedure Call Back phone  # [phone]  Permission to leave phone message Yes     Patient questions:  Do you have a fever, pain , or abdominal swelling? no Pain Score  0 *  Have you tolerated food without any problems? yes  Have you been able to return to your normal activities? yes  Do you have any questions about your discharge instructions: Diet   no Medications  no Follow up visit  no  Do you have questions or concerns about your Care? no  Actions: * If pain score is 4 or above: No action needed, pain <4.

## 2013-08-06 DIAGNOSIS — R310 Gross hematuria: Secondary | ICD-10-CM | POA: Diagnosis not present

## 2013-08-10 ENCOUNTER — Encounter: Payer: Self-pay | Admitting: Gastroenterology

## 2013-08-10 DIAGNOSIS — N390 Urinary tract infection, site not specified: Secondary | ICD-10-CM | POA: Diagnosis not present

## 2013-08-10 DIAGNOSIS — R350 Frequency of micturition: Secondary | ICD-10-CM | POA: Diagnosis not present

## 2013-08-10 DIAGNOSIS — R310 Gross hematuria: Secondary | ICD-10-CM | POA: Diagnosis not present

## 2013-08-10 DIAGNOSIS — N281 Cyst of kidney, acquired: Secondary | ICD-10-CM | POA: Diagnosis not present

## 2013-08-11 ENCOUNTER — Ambulatory Visit: Payer: Medicare Other | Admitting: Family Medicine

## 2013-08-16 ENCOUNTER — Ambulatory Visit: Payer: Medicare Other | Admitting: Family Medicine

## 2013-08-17 DIAGNOSIS — N390 Urinary tract infection, site not specified: Secondary | ICD-10-CM | POA: Diagnosis not present

## 2013-09-13 DIAGNOSIS — Z23 Encounter for immunization: Secondary | ICD-10-CM | POA: Diagnosis not present

## 2014-02-17 ENCOUNTER — Emergency Department (INDEPENDENT_AMBULATORY_CARE_PROVIDER_SITE_OTHER)
Admission: EM | Admit: 2014-02-17 | Discharge: 2014-02-17 | Disposition: A | Discharge status: Home / Self Care | Payer: Medicare Other | Source: Home / Self Care | Attending: Family Medicine | Admitting: Family Medicine

## 2014-02-17 ENCOUNTER — Encounter: Payer: Self-pay | Admitting: Emergency Medicine

## 2014-02-17 DIAGNOSIS — J069 Acute upper respiratory infection, unspecified: Secondary | ICD-10-CM | POA: Diagnosis not present

## 2014-02-17 DIAGNOSIS — R062 Wheezing: Secondary | ICD-10-CM | POA: Diagnosis not present

## 2014-02-17 MED ORDER — PREDNISONE 50 MG PO TABS: ORAL_TABLET | ORAL | Status: DC

## 2014-02-17 MED ORDER — METHYLPREDNISOLONE SODIUM SUCC 125 MG IJ SOLR
125.0000 mg | Freq: Once | INTRAMUSCULAR | Status: AC
Start: 2014-02-17 — End: 2014-02-17
  Administered 2014-02-17: 125 mg via INTRAMUSCULAR

## 2014-02-17 MED ORDER — ALBUTEROL SULFATE HFA 108 (90 BASE) MCG/ACT IN AERS: 2.0000 | INHALATION_SPRAY | RESPIRATORY_TRACT | Status: DC | PRN

## 2014-02-17 MED ORDER — AZITHROMYCIN 250 MG PO TABS: ORAL_TABLET | ORAL | Status: DC

## 2014-02-17 NOTE — ED Provider Notes (Signed)
CSN: [id number]     Arrival date & time 02/17/14  6160 History   First MD Initiated Contact with Patient 02/17/14 [phone]     Chief Complaint  Patient presents with  . Cough    HPI  URI Symptoms Onset: 3 days  Description: rhinorrhea, congestion, cough, chest tightness, wheezing  Modifying factors:  Former smoker   Symptoms Nasal discharge: yes Fever: no Sore throat:  Mild  Cough: yes Wheezing: yes Ear pain: no GI symptoms: no Sick contacts: no  Red Flags  Stiff neck: no Dyspnea: no Rash: no Swallowing difficulty: no  Sinusitis Risk Factors Headache/face pain: no Double sickening: no tooth pain: no  Allergy Risk Factors Sneezing: no Itchy scratchy throat: no Seasonal symptoms: no  Flu Risk Factors Headache: no muscle aches: no severe fatigue: no   Past Medical History  Diagnosis Date  . LBP (low back pain)   . Hyperlipidemia   . Hematuria   . BPH (benign prostatic hypertrophy)   . PAC (premature atrial contraction)     OCCASIONAL  . Finger amputation, traumatic     X2 RIGHT HAND --  SNOWBLOWER INJURY  . Diverticulosis of colon   . Nocturia   . Arthritis    Past Surgical History  Procedure Laterality Date  . Prostate biopsy  2007, 2010    benign.  Urology - Dr. Ellwood Sayers  . Nasal sinus surgery    . Transurethral resection of prostate  10-23-2009  . Rotator cuff repair Bilateral   . Lumbar spine surgery    . Stress echocardiogram  10-16-2012      NORMAL/ EF 55-60%/ MILD TR  . Cystoscopy with biopsy N/A 07/19/2013    Procedure: TRANSURETHRAL RESECTION OF BLADDER NECK;  Surgeon: Bernestine Amass, MD;  Location: [facility];  Service: Urology;  Laterality: N/A;   Family History  Problem Relation Age of Onset  . Stomach cancer Brother   . Colon polyps Neg Hx   . Esophageal cancer Neg Hx   . Rectal cancer Neg Hx   . Pancreatic cancer Neg Hx    History  Substance Use Topics  . Smoking status: Former Smoker    Quit date: 12/09/2009  .  Smokeless tobacco: Never Used  . Alcohol Use: 1.2 oz/week    2 Glasses of wine per week     Comment: occasional    Review of Systems  All other systems reviewed and are negative.    Allergies  Review of patient's allergies indicates no known allergies.  Home Medications   Current Outpatient Rx  Name  Route  Sig  Dispense  Refill  . azithromycin (ZITHROMAX) 250 MG tablet      Take 2 tabs PO x 1 dose, then 1 tab PO QD x 4 days   6 tablet   0   . Calcium Carbonate-Vitamin D (CALCIUM + D PO)   Oral   Take 1 tablet by mouth 2 (two) times daily.         . finasteride (PROSCAR) 5 MG tablet   Oral   Take 5 mg by mouth daily.          . pravastatin (PRAVACHOL) 80 MG tablet   Oral   Take 80 mg by mouth every evening.         . predniSONE (DELTASONE) 50 MG tablet      1 tab daily x 5 days   5 tablet   0    BP 156/83  Pulse  66  Temp(Src) 97.8 F (36.6 C) (Oral)  Resp 16  Wt 140 lb (63.504 kg)  SpO2 95% Physical Exam  Constitutional: He is oriented to person, place, and time. He appears well-developed and well-nourished.  HENT:  Head: Normocephalic and atraumatic.  Right Ear: External ear normal.  Left Ear: External ear normal.  +nasal erythema, rhinorrhea bilaterally, + post oropharyngeal erythema    Eyes: Conjunctivae are normal. Pupils are equal, round, and reactive to light.  Neck: Normal range of motion. Neck supple.  Cardiovascular: Normal rate and regular rhythm.   Pulmonary/Chest: Effort normal. He has wheezes.  Abdominal: Soft.  Musculoskeletal: Normal range of motion.  Neurological: He is alert and oriented to person, place, and time.  Skin: Skin is warm.    ED Course  Procedures (including critical care time) Labs Review Labs Reviewed - No data to display Imaging Review No results found.   MDM   1. URI (upper respiratory infection)   2. Wheezing    Likely viral induced obstructive lung disease exacerbation.  ? Undiagnosed  asthma/COPD.  Soumedrol 125mg  IM x1 Will place on course of prednisone and zpak Prn albuterol.  Discussed infectious and resp red flags at length with pt.  May need oupt PFTs 6-8 weeks s/p resoultion of sxs.  Follow up as needed..     The patient and/or caregiver has been counseled thoroughly with regard to treatment plan and/or medications prescribed including dosage, schedule, interactions, rationale for use, and possible side effects and they verbalize understanding. Diagnoses and expected course of recovery discussed and will return if not improved as expected or if the condition worsens. Patient and/or caregiver verbalized understanding.         Shanda Howells, MD 02/17/14 [phone]

## 2014-02-17 NOTE — ED Notes (Signed)
Pt c/o dry cough and burning throat without fever x 3 days. Rec'd flu vac this season.

## 2014-02-21 DIAGNOSIS — M67919 Unspecified disorder of synovium and tendon, unspecified shoulder: Secondary | ICD-10-CM | POA: Diagnosis not present

## 2014-02-22 ENCOUNTER — Telehealth: Payer: Self-pay | Admitting: Family Medicine

## 2014-02-22 NOTE — Telephone Encounter (Signed)
Pt has not been seen in this office since 2013. It would be best for him to schedule an appt. Pt notified and he will call in the am to schedule appt

## 2014-02-22 NOTE — Telephone Encounter (Signed)
Pt called and he would like to be referred to a Dermatologist for wound on chin that's not healing.

## 2014-02-23 ENCOUNTER — Ambulatory Visit (INDEPENDENT_AMBULATORY_CARE_PROVIDER_SITE_OTHER): Payer: Medicare Other | Admitting: Family Medicine

## 2014-02-23 ENCOUNTER — Encounter: Payer: Self-pay | Admitting: Family Medicine

## 2014-02-23 VITALS — BP 140/73 | HR 62 | Wt 137.0 lb

## 2014-02-23 DIAGNOSIS — B350 Tinea barbae and tinea capitis: Secondary | ICD-10-CM | POA: Diagnosis not present

## 2014-02-23 MED ORDER — CLOTRIMAZOLE 1 % EX CREA: TOPICAL_CREAM | CUTANEOUS | Status: AC

## 2014-02-23 NOTE — Progress Notes (Signed)
CC: Jim Tucker is a 73 y.o. male is here for lesion on the chin   Subjective: HPI:  Complains of a growth on the left shin that has been present for 2 weeks slowly enlarging. Originally the size of a small pimple. Slightly tender, worse with touching only to a mild degree. Has been using Neosporin without much benefit no other interventions as of yet. Described as redness and swelling. Nothing particularly makes it better or worse. Denies fevers, chills, nausea, flushing, nor skin changes elsewhere   Review Of Systems Outlined In HPI  Past Medical History  Diagnosis Date  . LBP (low back pain)   . Hyperlipidemia   . Hematuria   . BPH (benign prostatic hypertrophy)   . PAC (premature atrial contraction)     OCCASIONAL  . Finger amputation, traumatic     X2 RIGHT HAND --  SNOWBLOWER INJURY  . Diverticulosis of colon   . Nocturia   . Arthritis     Past Surgical History  Procedure Laterality Date  . Prostate biopsy  2007, 2010    benign.  Urology - Dr. Ellwood Sayers  . Nasal sinus surgery    . Transurethral resection of prostate  10-23-2009  . Rotator cuff repair Bilateral   . Lumbar spine surgery    . Stress echocardiogram  10-16-2012      NORMAL/ EF 55-60%/ MILD TR  . Cystoscopy with biopsy N/A 07/19/2013    Procedure: TRANSURETHRAL RESECTION OF BLADDER NECK;  Surgeon: Bernestine Amass, MD;  Location: [facility];  Service: Urology;  Laterality: N/A;   Family History  Problem Relation Age of Onset  . Stomach cancer Brother   . Colon polyps Neg Hx   . Esophageal cancer Neg Hx   . Rectal cancer Neg Hx   . Pancreatic cancer Neg Hx     History   Social History  . Marital Status: Married    Spouse Name: Tretha Sciara    Number of Children: 2   . Years of Education: N/A   Occupational History  . Retired Clinical biochemist.     Social History Main Topics  . Smoking status: Former Smoker    Quit date: 12/09/2009  . Smokeless tobacco: Never Used  . Alcohol Use: 1.2  oz/week    2 Glasses of wine per week     Comment: occasional  . Drug Use: No  . 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   Other Topics Concern  . 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     Objective: BP 140/73  Pulse 62  Wt 137 lb (62.143 kg)  General: Alert and Oriented, No Acute Distress HEENT: Pupils equal, round, reactive to light. Conjunctivae clear.  Moist mucous membranes. 1 cm diameter erythematous mild ulcerative appearing circular lesion tender to the touch with erythema concentrated hair follicles. Cardiac: Regular rate and rhythm.  Mental Status: No depression, anxiety, nor agitation. Skin: Warm and dry.  Assessment & Plan: [place] was seen today for lesion on the chin.  Diagnoses and associated orders for this visit:  Tinea barbae - clotrimazole (LOTRIMIN) 1 % cream; Apply to affected areas twice a day for up to four weeks, applying up to two weeks after resolution of symptoms.    Lesion is highly suspicious for tinea barbae, given the  mild presentation we'll start with clotrimazole of no benefit in one week will switch to systemic therapy such as terbinafine  Return if symptoms worsen or fail to improve.

## 2014-03-01 ENCOUNTER — Other Ambulatory Visit: Payer: Self-pay | Admitting: *Deleted

## 2014-03-01 MED ORDER — PRAVASTATIN SODIUM 80 MG PO TABS: 80.0000 mg | ORAL_TABLET | Freq: Every evening | ORAL | Status: DC

## 2014-04-07 DIAGNOSIS — M67919 Unspecified disorder of synovium and tendon, unspecified shoulder: Secondary | ICD-10-CM | POA: Diagnosis not present

## 2014-04-07 DIAGNOSIS — M719 Bursopathy, unspecified: Secondary | ICD-10-CM | POA: Diagnosis not present

## 2014-04-16 DIAGNOSIS — M19019 Primary osteoarthritis, unspecified shoulder: Secondary | ICD-10-CM | POA: Diagnosis not present

## 2014-04-18 DIAGNOSIS — M75120 Complete rotator cuff tear or rupture of unspecified shoulder, not specified as traumatic: Secondary | ICD-10-CM | POA: Diagnosis not present

## 2014-04-18 DIAGNOSIS — IMO0002 Reserved for concepts with insufficient information to code with codable children: Secondary | ICD-10-CM | POA: Diagnosis not present

## 2014-04-29 ENCOUNTER — Encounter: Payer: Self-pay | Admitting: Family Medicine

## 2014-04-29 ENCOUNTER — Ambulatory Visit (INDEPENDENT_AMBULATORY_CARE_PROVIDER_SITE_OTHER): Payer: Medicare Other | Admitting: Family Medicine

## 2014-04-29 VITALS — BP 145/87 | HR 71 | Ht 60.0 in | Wt 139.0 lb

## 2014-04-29 DIAGNOSIS — Z79899 Other long term (current) drug therapy: Secondary | ICD-10-CM | POA: Diagnosis not present

## 2014-04-29 DIAGNOSIS — Z0000 Encounter for general adult medical examination without abnormal findings: Secondary | ICD-10-CM | POA: Diagnosis not present

## 2014-04-29 DIAGNOSIS — E785 Hyperlipidemia, unspecified: Secondary | ICD-10-CM | POA: Diagnosis not present

## 2014-04-29 DIAGNOSIS — Z131 Encounter for screening for diabetes mellitus: Secondary | ICD-10-CM | POA: Diagnosis not present

## 2014-04-29 DIAGNOSIS — Z136 Encounter for screening for cardiovascular disorders: Secondary | ICD-10-CM | POA: Diagnosis not present

## 2014-04-29 DIAGNOSIS — Z23 Encounter for immunization: Secondary | ICD-10-CM | POA: Diagnosis not present

## 2014-04-29 DIAGNOSIS — Z125 Encounter for screening for malignant neoplasm of prostate: Secondary | ICD-10-CM

## 2014-04-29 DIAGNOSIS — N400 Enlarged prostate without lower urinary tract symptoms: Secondary | ICD-10-CM

## 2014-04-29 DIAGNOSIS — Z5181 Encounter for therapeutic drug level monitoring: Secondary | ICD-10-CM | POA: Diagnosis not present

## 2014-04-29 LAB — COMPLETE METABOLIC PANEL WITH GFR
ALT: 27 U/L (ref 0–53)
AST: 19 U/L (ref 0–37)
Albumin: 3.8 g/dL (ref 3.5–5.2)
Alkaline Phosphatase: 92 U/L (ref 39–117)
BILIRUBIN TOTAL: 0.8 mg/dL (ref 0.2–1.2)
BUN: 26 mg/dL — ABNORMAL HIGH (ref 6–23)
CHLORIDE: 105 meq/L (ref 96–112)
CO2: 25 mEq/L (ref 19–32)
CREATININE: 0.71 mg/dL (ref 0.50–1.35)
Calcium: 9.2 mg/dL (ref 8.4–10.5)
GFR, Est African American: >89 mL/min
GFR, Est Non African American: >89 mL/min
Glucose, Bld: 83 mg/dL (ref 70–99)
Potassium: 3.9 mEq/L (ref 3.5–5.3)
Sodium: 139 mEq/L (ref 135–145)
Total Protein: 6.4 g/dL (ref 6.0–8.3)

## 2014-04-29 LAB — LIPID PANEL
Cholesterol: 266 mg/dL — ABNORMAL HIGH (ref 0–200)
HDL: 72 mg/dL (ref >39)
LDL CALC: 172 mg/dL — AB (ref 0–99)
Total CHOL/HDL Ratio: 3.7 Ratio
Triglycerides: 110 mg/dL (ref <150)
VLDL: 22 mg/dL (ref 0–40)

## 2014-04-29 MED ORDER — TAMSULOSIN HCL 0.4 MG PO CAPS: 0.4000 mg | ORAL_CAPSULE | Freq: Every day | ORAL | Status: DC

## 2014-04-29 NOTE — Patient Instructions (Signed)
Dr. Jewett Mcgann's General Advice Following Your Complete Physical Exam  The Benefits of Regular Exercise: Unless you suffer from an uncontrolled cardiovascular condition, studies strongly suggest that regular exercise and physical activity will add to both the quality and length of your life.  The World Health Organization recommends 150 minutes of moderate intensity aerobic activity every week.  This is best split over 3-4 days a week, and can be as simple as a brisk walk for just over 35 minutes "most days of the week".  This type of exercise has been shown to lower LDL-Cholesterol, lower average blood sugars, lower blood pressure, lower cardiovascular disease risk, improve memory, and increase one's overall sense of wellbeing.  The addition of anaerobic (or "strength training") exercises offers additional benefits including but not limited to increased metabolism, prevention of osteoporosis, and improved overall cholesterol levels.  How Can I Strive For A Low-Fat Diet?: Current guidelines recommend that 25-35 percent of your daily energy (food) intake should come from fats.  One might ask how can this be achieved without having to dissect each meal on a daily basis?  Switch to skim or 1% milk instead of whole milk.  Focus on lean meats such as ground turkey, fresh fish, baked chicken, and lean cuts of beef as your source of dietary protein.  Limit saturated fat consumption to less than 10% of your daily caloric intake.  Limit trans fatty acid consumption primarily by limiting synthetic trans fats such as partially hydrogenated oils (Ex: fried fast foods).  Substitute olive or vegetable oil for solid fats where possible.  Moderation of Salt Intake: Provided you don't carry a diagnosis of congestive heart failure nor renal failure, I recommend a daily allowance of no more than 2300 mg of salt (sodium).  Keeping under this daily goal is associated with a decreased risk of cardiovascular events, creeping  above it can lead to elevated blood pressures and increases your risk of cardiovascular events.  Milligrams (mg) of salt is listed on all nutrition labels, and your daily intake can add up faster than you think.  Most canned and frozen dinners can pack in over half your daily salt allowance in one meal.    Lifestyle Health Risks: Certain lifestyle choices carry specific health risks.  As you may already know, tobacco use has been associated with increasing one's risk of cardiovascular disease, pulmonary disease, numerous cancers, among many other issues.  What you may not know is that there are medications and nicotine replacement strategies that can more than double your chances of successfully quitting.  I would be thrilled to help manage your quitting strategy if you currently use tobacco products.  When it comes to alcohol use, I've yet to find an "ideal" daily allowance.  Provided an individual does not have a medical condition that is exacerbated by alcohol consumption, general guidelines determine "safe drinking" as no more than two standard drinks for a man or no more than one standard drink for a male per day.  However, much debate still exists on whether any amount of alcohol consumption is technically "safe".  My general advice, keep alcohol consumption to a minimum for general health promotion.  If you or others believe that alcohol, tobacco, or recreational drug use is interfering with your life, I would be happy to provide confidential counseling regarding treatment options.  General "Over The Counter" Nutrition Advice: Postmenopausal women should aim for a daily calcium intake of 1200 mg, however a significant portion of this might already be   provided by diets including milk, yogurt, cheese, and other dairy products.  Vitamin D has been shown to help preserve bone density, prevent fatigue, and has even been shown to help reduce falls in the elderly.  Ensuring a daily intake of 800 Units of  Vitamin D is a good place to start to enjoy the above benefits, we can easily check your Vitamin D level to see if you'd potentially benefit from supplementation beyond 800 Units a day.  Folic Acid intake should be of particular concern to women of childbearing age.  Daily consumption of 400-800 mcg of Folic Acid is recommended to minimize the chance of spinal cord defects in a fetus should pregnancy occur.    For many adults, accidents still remain one of the most common culprits when it comes to cause of death.  Some of the simplest but most effective preventitive habits you can adopt include regular seatbelt use, proper helmet use, securing firearms, and regularly testing your smoke and carbon monoxide detectors.  Gurnie Duris B. Nomie Buchberger DO [facility] [address], [address] [place],  [address] Phone: [phone]  

## 2014-04-29 NOTE — Progress Notes (Signed)
Subjective:    Jim Tucker is a 73 y.o. male who presents for Medicare Annual/Subsequent preventive examination.   Preventive Screening-Counseling & Management  Tobacco History  Smoking status  . Former Smoker  . Quit date: 12/09/2009  Smokeless tobacco  . Never Used   Colonoscopy: 07/2013 with repeat in 2019 Prostate: Discussed screening risks/beneifts with patient today, he would prefer to avoid PSA and prostate exam until he sees his urologist next month. He wakes 2-3 times a night to urinate and is not on finasteride right now. We discussed the benefits of Flomax and he would like to start to look for any benefits prior to meeting with his urologist.  Influenza Vaccine: Out of season Pneumovax: Pneumococcal 2007, he will receive Prevnar today Td/Tdap: Td 2007 Zoster: He declines at today's visit    Problems Prior to Visit 1. he has a rotator cuff surgery planned for next Thursday.  Current Problems (verified) Patient Active Problem List   Diagnosis Date Noted  . ROTATOR CUFF SYNDROME, LEFT 09/14/2010  . COLONIC POLYPS 03/28/2008  . DIVERTICULOSIS OF COLON 03/28/2008  . HYPRTRPHY PROSTATE BNG W/O URINARY OBST/LUTS 09/10/2007  . PTERYGIUM NOS 03/02/2007  . DYSPEPSIA 12/03/2006  . SEBORRHEIC KERATOSIS 11/18/2006  . HYPERLIPIDEMIA 11/13/2006  . DYSRHYTHMIA, CARDIAC NOS 11/13/2006    Medications Prior to Visit Current Outpatient Prescriptions on File Prior to Visit  Medication Sig Dispense Refill  . finasteride (PROSCAR) 5 MG tablet Take 5 mg by mouth daily.       . pravastatin (PRAVACHOL) 80 MG tablet Take 1 tablet (80 mg total) by mouth every evening.  80 tablet  0   No current facility-administered medications on file prior to visit.    Current Medications (verified) Current Outpatient Prescriptions  Medication Sig Dispense Refill  . finasteride (PROSCAR) 5 MG tablet Take 5 mg by mouth daily.       . pravastatin (PRAVACHOL) 80 MG tablet Take 1 tablet (80  mg total) by mouth every evening.  80 tablet  0  . tamsulosin (FLOMAX) 0.4 MG CAPS capsule Take 1 capsule (0.4 mg total) by mouth daily.  30 capsule  3   No current facility-administered medications for this visit.     Allergies (verified) Review of patient's allergies indicates no known allergies.   PAST HISTORY  Family History Family History  Problem Relation Age of Onset  . Stomach cancer Brother   . Colon polyps Neg Hx   . Esophageal cancer Neg Hx   . Rectal cancer Neg Hx   . Pancreatic cancer Neg Hx     Social History History  Substance Use Topics  . Smoking status: Former Smoker    Quit date: 12/09/2009  . Smokeless tobacco: Never Used  . Alcohol Use: 1.2 oz/week    2 Glasses of wine per week     Comment: occasional    Are there smokers in your home (other than you)?  No  Risk Factors Current exercise habits: Home exercise routine includes calisthenics and stretching.  Dietary issues discussed: DASH diet   Cardiac risk factors: advanced age (older than 74 for men, 38 for women) and dyslipidemia.  Depression Screen (Note: if answer to either of the following is "Yes", a more complete depression screening is indicated)   Q1: Over the past two weeks, have you felt down, depressed or hopeless? No  Q2: Over the past two weeks, have you felt little interest or pleasure in doing things? No  Have you lost interest or pleasure  in daily life? No  Do you often feel hopeless? No  Do you cry easily over simple problems? No  Activities of Daily Living In your present state of health, do you have any difficulty performing the following activities?:  Driving? No Managing money?  No Feeding yourself? No Getting from bed to chair? No Climbing a flight of stairs? No Preparing food and eating?: No Bathing or showering? No Getting dressed: No Getting to the toilet? No Using the toilet:No Moving around from place to place: No In the past year have you fallen or had a near  fall?:No   Are you sexually active?  No  Do you have more than one partner?  No  Hearing Difficulties: No Do you often ask people to speak up or repeat themselves? No Do you experience ringing or noises in your ears? No Do you have difficulty understanding soft or whispered voices? No   Do you feel that you have a problem with memory? No  Do you often misplace items? No  Do you feel safe at home?  No  Cognitive Testing  Alert? Yes Normal Appearance?Yes  Oriented to person? Yes  Place? Yes   Time? Yes  Recall of three objects?  Yes  Can perform simple calculations? Yes  Displays appropriate judgment?Yes  Can read the correct time from a watch face?Yes   Advanced Directives have been discussed with the patient? Yes   List the Names of Other Physician/Practitioners you currently use: 1.  Dr Carson Myrtle any recent Medical Services you may have received from other than [facility] providers in the past year (date may be approximate).  Immunization History  Administered Date(s) Administered  . Influenza Split 10/14/2011  . Influenza Whole 09/10/2007, 08/25/2008, 09/14/2010  . Pneumococcal Polysaccharide-23 11/13/2006  . Td 11/13/2006    Screening Tests Health Maintenance  Topic Date Due  . Influenza Vaccine  07/09/2014  . Tetanus/tdap  11/13/2016  . Colonoscopy  08/02/2018  . Pneumococcal Polysaccharide Vaccine Age 20 And Over  Completed  . Zostavax  Addressed    All answers were reviewed with the patient and necessary referrals were made:  Marcial Pacas, DO   04/29/2014   History reviewed: allergies, current medications, past family history, past medical history, past social history, past surgical history and problem list  Review of Systems - General ROS: negative for - chills, fever, night sweats, weight gain or weight loss Ophthalmic ROS: negative for - decreased vision Psychological ROS: negative for - anxiety or depression ENT ROS: negative for - hearing change,  nasal congestion, tinnitus or allergies Hematological and Lymphatic ROS: negative for - bleeding problems, bruising or swollen lymph nodes Breast ROS: negative Respiratory ROS: no cough, shortness of breath, or wheezing Cardiovascular ROS: no chest pain or dyspnea on exertion Gastrointestinal ROS: no abdominal pain, change in bowel habits, or black or bloody stools Genito-Urinary ROS: negative for - genital discharge, genital ulcers, incontinence or abnormal bleeding from genitals Musculoskeletal ROS: negative for - joint pain or muscle pain Neurological ROS: negative for - headaches or memory loss Dermatological ROS: negative for lumps, mole changes, rash and skin lesion changes   Objective:     Vision by Snellen chart: 20/30 L&R& Bilateral Blood pressure 145/87, pulse 71, height 5' (1.524 m), weight 139 lb (63.05 kg). Body mass index is 27.15 kg/(m^2).  General: No Acute Distress HEENT: Atraumatic, normocephalic, conjunctivae normal without scleral icterus.  No nasal discharge, hearing grossly intact, TMs with good landmarks bilaterally with no middle  ear abnormalities, posterior pharynx clear without oral lesions. Neck: Supple, trachea midline, no cervical nor supraclavicular adenopathy. Pulmonary: Clear to auscultation bilaterally without wheezing, rhonchi, nor rales. Cardiac: Regular rate and rhythm.  No murmurs, rubs, nor gallops. No peripheral edema.  2+ peripheral pulses bilaterally. Abdomen: Bowel sounds normal.  No masses.  Non-tender without rebound.  Negative Murphy's sign. GU: He has deferred this to his urologist. MSK: Grossly intact, no signs of weakness.  Full strength throughout upper and lower extremities.  Full ROM in upper and lower extremities.  No midline spinal tenderness. Neuro: Gait unremarkable, CN II-XII grossly intact.  C5-C6 Reflex 2/4 Bilaterally, L4 Reflex 2/4 Bilaterally.  Cerebellar function intact. Skin: No rashes. Noninflamed seborrheic keratosis on left  forearm Psych: Alert and oriented to person/place/time.  Thought process normal. No anxiety/depression.     Assessment:      over due for Prevnar, symptoms of BPH therefore start Flomax followup with urology for PSA and prostate exam. He has elected to get an EKG to screen for cardiac disease.     Plan:   EKG: Mild sinus bradycardia, left axis deviation, normal intervals, no pathologic Q waves, no ST elevation or depression. Essentially no change from tracing from 2013.    During the course of the visit the patient was educated and counseled about appropriate screening and preventive services including:    Pneumococcal vaccine   Screening electrocardiogram  Prostate cancer screening  Advanced directives: has NO advanced directive  - add't info requested. Referral to SW: no, he was given literature on how to complete this himself  Diet review for nutrition referral?  not indicated     Patient Instructions (the written plan) was given to the patient.  Medicare Attestation I have personally reviewed: The patient's medical and social history Their use of alcohol, tobacco or illicit drugs Their current medications and supplements The patient's functional ability including ADLs,fall risks, home safety risks, cognitive, and hearing and visual impairment Diet and physical activities Evidence for depression or mood disorders  The patient's weight, height, BMI, and visual acuity have been recorded in the chart.  I have made referrals, counseling, and provided education to the patient based on review of the above and I have provided the patient with a written personalized care plan for preventive services.     Marcial Pacas, DO   04/29/2014

## 2014-05-03 ENCOUNTER — Telehealth: Payer: Self-pay | Admitting: Family Medicine

## 2014-05-03 DIAGNOSIS — S43439A Superior glenoid labrum lesion of unspecified shoulder, initial encounter: Secondary | ICD-10-CM | POA: Diagnosis not present

## 2014-05-03 DIAGNOSIS — M25819 Other specified joint disorders, unspecified shoulder: Secondary | ICD-10-CM | POA: Diagnosis not present

## 2014-05-03 DIAGNOSIS — M19019 Primary osteoarthritis, unspecified shoulder: Secondary | ICD-10-CM | POA: Diagnosis not present

## 2014-05-03 DIAGNOSIS — M67919 Unspecified disorder of synovium and tendon, unspecified shoulder: Secondary | ICD-10-CM | POA: Diagnosis not present

## 2014-05-03 DIAGNOSIS — M66329 Spontaneous rupture of flexor tendons, unspecified upper arm: Secondary | ICD-10-CM | POA: Diagnosis not present

## 2014-05-03 DIAGNOSIS — M719 Bursopathy, unspecified: Secondary | ICD-10-CM | POA: Diagnosis not present

## 2014-05-03 DIAGNOSIS — G8918 Other acute postprocedural pain: Secondary | ICD-10-CM | POA: Diagnosis not present

## 2014-05-03 DIAGNOSIS — M75120 Complete rotator cuff tear or rupture of unspecified shoulder, not specified as traumatic: Secondary | ICD-10-CM | POA: Diagnosis not present

## 2014-05-03 DIAGNOSIS — M24119 Other articular cartilage disorders, unspecified shoulder: Secondary | ICD-10-CM | POA: Diagnosis not present

## 2014-05-03 MED ORDER — ATORVASTATIN CALCIUM 40 MG PO TABS: 40.0000 mg | ORAL_TABLET | Freq: Every day | ORAL | Status: DC

## 2014-05-03 NOTE — Telephone Encounter (Signed)
Seth Bake, Will you please let patient know that his labs were normal except for a significantly elevated LDL cholesterol.  I'd recommend he stop pravastatin and instead start a new cholesterol medication called atorvastatin which I've sent to his [place].  I'd recommend returning in 3 months to recheck this.

## 2014-05-03 NOTE — Telephone Encounter (Signed)
Pt.notified

## 2014-05-04 ENCOUNTER — Encounter: Payer: Self-pay | Admitting: *Deleted

## 2014-05-12 DIAGNOSIS — IMO0002 Reserved for concepts with insufficient information to code with codable children: Secondary | ICD-10-CM | POA: Diagnosis not present

## 2014-05-27 DIAGNOSIS — M47817 Spondylosis without myelopathy or radiculopathy, lumbosacral region: Secondary | ICD-10-CM | POA: Diagnosis not present

## 2014-06-02 DIAGNOSIS — M48061 Spinal stenosis, lumbar region without neurogenic claudication: Secondary | ICD-10-CM | POA: Diagnosis not present

## 2014-06-02 DIAGNOSIS — IMO0002 Reserved for concepts with insufficient information to code with codable children: Secondary | ICD-10-CM | POA: Diagnosis not present

## 2014-06-20 DIAGNOSIS — M48061 Spinal stenosis, lumbar region without neurogenic claudication: Secondary | ICD-10-CM | POA: Diagnosis not present

## 2014-06-20 DIAGNOSIS — IMO0002 Reserved for concepts with insufficient information to code with codable children: Secondary | ICD-10-CM | POA: Diagnosis not present

## 2014-07-07 DIAGNOSIS — M48061 Spinal stenosis, lumbar region without neurogenic claudication: Secondary | ICD-10-CM | POA: Diagnosis not present

## 2014-07-07 DIAGNOSIS — IMO0002 Reserved for concepts with insufficient information to code with codable children: Secondary | ICD-10-CM | POA: Diagnosis not present

## 2014-08-04 DIAGNOSIS — IMO0002 Reserved for concepts with insufficient information to code with codable children: Secondary | ICD-10-CM | POA: Diagnosis not present

## 2014-08-04 DIAGNOSIS — M48061 Spinal stenosis, lumbar region without neurogenic claudication: Secondary | ICD-10-CM | POA: Diagnosis not present

## 2014-08-08 ENCOUNTER — Other Ambulatory Visit (HOSPITAL_COMMUNITY): Payer: Self-pay | Admitting: Orthopaedic Surgery

## 2014-08-08 ENCOUNTER — Encounter: Payer: Self-pay | Admitting: Family Medicine

## 2014-08-08 DIAGNOSIS — M5126 Other intervertebral disc displacement, lumbar region: Secondary | ICD-10-CM | POA: Insufficient documentation

## 2014-08-09 ENCOUNTER — Encounter (HOSPITAL_COMMUNITY): Payer: Self-pay | Admitting: Pharmacy Technician

## 2014-08-09 NOTE — Pre-Procedure Instructions (Signed)
[place]  08/09/2014   Your procedure is scheduled on:  Fri, Sept 4 @ 11:55 AM  Report to [facility]  at 9:45 AM.  Call this number if you have problems the morning of surgery: [phone]   Remember:   Do not eat food or drink liquids after midnight.   Take these medicines the morning of surgery with A SIP OF WATER: Proscar(Finasteride)              No Goody's,BC's,Aleve,Aspirin,Ibuprofen,Fish Oil,or any Herbal Medications   Do not wear jewelry  Do not wear lotions, powders, or colognes. You may wear deodorant.  Men may shave face and neck.  Do not bring valuables to the hospital.  [facility] is not responsible                  for any belongings or valuables.               Contacts, dentures or bridgework may not be worn into surgery.  Leave suitcase in the car. After surgery it may be brought to your room.  For patients admitted to the hospital, discharge time is determined by your                treatment team.                  Special Instructions:  Jim Tucker - Preparing for Surgery  Before surgery, you can play an important role.  Because skin is not sterile, your skin needs to be as free of germs as possible.  You can reduce the number of germs on you skin by washing with CHG (chlorahexidine gluconate) soap before surgery.  CHG is an antiseptic cleaner which kills germs and bonds with the skin to continue killing germs even after washing.  Please DO NOT use if you have an allergy to CHG or antibacterial soaps.  If your skin becomes reddened/irritated stop using the CHG and inform your nurse when you arrive at Short Stay.  Do not shave (including legs and underarms) for at least 48 hours prior to the first CHG shower.  You may shave your face.  Please follow these instructions carefully:   1.  Shower with CHG Soap the night before surgery and the                                morning of Surgery.  2.  If you choose to wash your hair, wash your hair  first as usual with your       normal shampoo.  3.  After you shampoo, rinse your hair and body thoroughly to remove the                      Shampoo.  4.  Use CHG as you would any other liquid soap.  You can apply chg directly       to the skin and wash gently with scrungie or a clean washcloth.  5.  Apply the CHG Soap to your body ONLY FROM THE NECK DOWN.        Do not use on open wounds or open sores.  Avoid contact with your eyes,       ears, mouth and genitals (private parts).  Wash genitals (private parts)       with your normal soap.  6.  Wash thoroughly, paying special attention to  the area where your surgery        will be performed.  7.  Thoroughly rinse your body with warm water from the neck down.  8.  DO NOT shower/wash with your normal soap after using and rinsing off       the CHG Soap.  9.  Pat yourself dry with a clean towel.            10.  Wear clean pajamas.            11.  Place clean sheets on your bed the night of your first shower and do not        sleep with pets.  Day of Surgery  Do not apply any lotions/deoderants the morning of surgery.  Please wear clean clothes to the hospital/surgery center.     Please read over the following fact sheets that you were given: Pain Booklet, Coughing and Deep Breathing, MRSA Information and Surgical Site Infection Prevention

## 2014-08-10 ENCOUNTER — Encounter (HOSPITAL_COMMUNITY): Payer: Self-pay

## 2014-08-10 ENCOUNTER — Encounter (HOSPITAL_COMMUNITY)
Admission: RE | Admit: 2014-08-10 | Discharge: 2014-08-10 | Disposition: A | Discharge status: Home / Self Care | Payer: Medicare Other | Source: Ambulatory Visit | Attending: Orthopaedic Surgery | Admitting: Orthopaedic Surgery

## 2014-08-10 DIAGNOSIS — M48061 Spinal stenosis, lumbar region without neurogenic claudication: Secondary | ICD-10-CM | POA: Diagnosis not present

## 2014-08-10 DIAGNOSIS — M5126 Other intervertebral disc displacement, lumbar region: Secondary | ICD-10-CM | POA: Diagnosis not present

## 2014-08-10 DIAGNOSIS — Z87891 Personal history of nicotine dependence: Secondary | ICD-10-CM | POA: Diagnosis not present

## 2014-08-10 DIAGNOSIS — M519 Unspecified thoracic, thoracolumbar and lumbosacral intervertebral disc disorder: Secondary | ICD-10-CM | POA: Diagnosis not present

## 2014-08-10 DIAGNOSIS — Z01812 Encounter for preprocedural laboratory examination: Secondary | ICD-10-CM | POA: Diagnosis not present

## 2014-08-10 DIAGNOSIS — Z79899 Other long term (current) drug therapy: Secondary | ICD-10-CM | POA: Diagnosis not present

## 2014-08-10 DIAGNOSIS — M545 Low back pain, unspecified: Secondary | ICD-10-CM | POA: Diagnosis not present

## 2014-08-10 DIAGNOSIS — IMO0002 Reserved for concepts with insufficient information to code with codable children: Secondary | ICD-10-CM | POA: Diagnosis not present

## 2014-08-10 DIAGNOSIS — Z01818 Encounter for other preprocedural examination: Secondary | ICD-10-CM | POA: Diagnosis not present

## 2014-08-10 DIAGNOSIS — Z7982 Long term (current) use of aspirin: Secondary | ICD-10-CM | POA: Diagnosis not present

## 2014-08-10 DIAGNOSIS — Z981 Arthrodesis status: Secondary | ICD-10-CM | POA: Diagnosis not present

## 2014-08-10 HISTORY — DX: Diverticulosis of intestine, part unspecified, without perforation or abscess without bleeding: K57.90

## 2014-08-10 HISTORY — DX: Paresthesia of skin: R20.2

## 2014-08-10 HISTORY — DX: Personal history of colonic polyps: Z86.010

## 2014-08-10 HISTORY — DX: Anesthesia of skin: R20.0

## 2014-08-10 HISTORY — DX: Personal history of colon polyps, unspecified: Z86.0100

## 2014-08-10 HISTORY — DX: Other hemorrhoids: K64.8

## 2014-08-10 LAB — CBC
HEMATOCRIT: 50.1 % (ref 39.0–52.0)
HEMOGLOBIN: 17.4 g/dL — AB (ref 13.0–17.0)
MCH: 31.1 pg (ref 26.0–34.0)
MCHC: 34.7 g/dL (ref 30.0–36.0)
MCV: 89.6 fL (ref 78.0–100.0)
Platelets: 187 10*3/uL (ref 150–400)
RBC: 5.59 MIL/uL (ref 4.22–5.81)
RDW: 13.8 % (ref 11.5–15.5)
WBC: 7.7 10*3/uL (ref 4.0–10.5)

## 2014-08-10 LAB — COMPREHENSIVE METABOLIC PANEL
ALT: 27 U/L (ref 0–53)
AST: 21 U/L (ref 0–37)
Albumin: 3.6 g/dL (ref 3.5–5.2)
Alkaline Phosphatase: 113 U/L (ref 39–117)
Anion gap: 14 (ref 5–15)
BILIRUBIN TOTAL: 0.5 mg/dL (ref 0.3–1.2)
BUN: 20 mg/dL (ref 6–23)
CALCIUM: 9.6 mg/dL (ref 8.4–10.5)
CHLORIDE: 104 meq/L (ref 96–112)
CO2: 21 mEq/L (ref 19–32)
CREATININE: 0.61 mg/dL (ref 0.50–1.35)
GFR calc non Af Amer: >90 mL/min (ref >90)
GLUCOSE: 147 mg/dL — AB (ref 70–99)
Potassium: 4.1 mEq/L (ref 3.7–5.3)
Sodium: 139 mEq/L (ref 137–147)
Total Protein: 7.1 g/dL (ref 6.0–8.3)

## 2014-08-10 LAB — SURGICAL PCR SCREEN
MRSA, PCR: NEGATIVE
Staphylococcus aureus: NEGATIVE

## 2014-08-10 LAB — PROTIME-INR
INR: 0.97 (ref 0.00–1.49)
PROTHROMBIN TIME: 12.9 s (ref 11.6–15.2)

## 2014-08-10 NOTE — Progress Notes (Addendum)
Pt doesn't have a cardiologist but did see one in 2013(under media tab [facility])  Stress test done in 2009/2013  Echo report in epic from 2013  Denies ever having a heart cath   Denies CXR in past yr  Medical Md is Dr.Sean Hommel in [place]  EKG in epic from 04-29-14

## 2014-08-11 MED ORDER — CEFAZOLIN SODIUM-DEXTROSE 2-3 GM-% IV SOLR
2.0000 g | INTRAVENOUS | Status: AC
  Administered 2014-08-12: 2 g via INTRAVENOUS
  Filled 2014-08-11: qty 50

## 2014-08-11 NOTE — H&P (Signed)
  [facility]   A Division of [facility]   [address] [place], [place] [address] Telephone: [phone]  Fax: [phone]     PATIENT: Jim Tucker, Jim Tucker   MR#: [id number]  DOB: [birth year]       Patient has been followed by Dr. Sharol Given.  Has had 3 epidurals for right L2-3 large HNP 2 levels above a solid L4-5 fusion done in [place].  He has a large HNP with significant stenosis at L2-3  Disk migration up behind L2 and now below L3 with pseudo spinal stenosis from the large HNP.  He has not gotten relief with conservative treatment.  MRI was 05/27/2014.  He has been ambulator with a cane.  Has to lean on it with both hands.  Muscle relaxant, anti-inflammatory, activity modification has not been helpful.   CURRENT MEDICATIONS:    He is on finasteride, a Pravacol, and also 1 aspirin a day.   PMH/PSH:  He has had previous rotator cuff surgery.     SOCIAL HISTORY:  He is married to his wife Tretha Sciara.  He is originally from [place].  Does not smoke or drink. Retired.   REVIEW OF SYSTEMS:  Fourteen point review of systems positive for longstanding rotator cuff tear.  He is normally followed by Dr. Loyal Gambler.     PHYSICAL EXAMINATION:  Patient is 5 feet 2 inches, weight 130 pounds.  Extraocular movements intact.  Lungs are clear.  Heart regular rate and rhythm.  Upper extremity reflexes are 2+.  He has trace knee jerk on the right, 2+ on the left.  Gastrocsoleus reflex is 1+ and symmetrical.  Lumbar incision is well healed.  Patient has pedicle instrumentation at L4-5. He does have an antalgic gait.  He limps on the right side.  He has a negative straight leg raise on the right with good motor strength in all motor groups of both lower extremities.       ASSESSMENT:  Collapse at the L3-4 level with previous decompression done at the time of surgery and severe stenosis from a large disk herniation at right L2-3.   PLAN:  Hemilaminectomy with lateral  recess decompression and microdiscectomy on the right at L2-3. Pt does not have any left leg symptoms.  Procedure discussed.  Risks discussed.  He understands, requests to proceed.          Mark C. Lorin Mercy, M.D.    Auto-Authenticated by Thana Farr. Lorin Mercy, M.D.

## 2014-08-11 NOTE — Progress Notes (Signed)
Instructed patient to arrive at 745 am 08-12-14.

## 2014-08-12 ENCOUNTER — Inpatient Hospital Stay (HOSPITAL_COMMUNITY)
Admission: RE | Admit: 2014-08-12 | Discharge: 2014-08-14 | DRG: 520 | Disposition: A | Discharge status: Home / Self Care | Payer: Medicare Other | Source: Ambulatory Visit | Attending: Orthopaedic Surgery | Admitting: Orthopaedic Surgery

## 2014-08-12 ENCOUNTER — Encounter (HOSPITAL_COMMUNITY)
Admission: RE | Disposition: A | Discharge status: Home / Self Care | Payer: Self-pay | Source: Ambulatory Visit | Attending: Orthopaedic Surgery

## 2014-08-12 ENCOUNTER — Inpatient Hospital Stay (HOSPITAL_COMMUNITY): Payer: Medicare Other | Admitting: Anesthesiology

## 2014-08-12 ENCOUNTER — Encounter (HOSPITAL_COMMUNITY): Payer: Self-pay | Admitting: Certified Registered"

## 2014-08-12 ENCOUNTER — Inpatient Hospital Stay (HOSPITAL_COMMUNITY): Payer: Medicare Other

## 2014-08-12 ENCOUNTER — Encounter (HOSPITAL_COMMUNITY): Payer: Medicare Other | Admitting: Anesthesiology

## 2014-08-12 DIAGNOSIS — Z7982 Long term (current) use of aspirin: Secondary | ICD-10-CM

## 2014-08-12 DIAGNOSIS — Z87891 Personal history of nicotine dependence: Secondary | ICD-10-CM | POA: Diagnosis not present

## 2014-08-12 DIAGNOSIS — Z01812 Encounter for preprocedural laboratory examination: Secondary | ICD-10-CM | POA: Diagnosis not present

## 2014-08-12 DIAGNOSIS — M48061 Spinal stenosis, lumbar region without neurogenic claudication: Secondary | ICD-10-CM | POA: Diagnosis not present

## 2014-08-12 DIAGNOSIS — IMO0002 Reserved for concepts with insufficient information to code with codable children: Secondary | ICD-10-CM | POA: Diagnosis not present

## 2014-08-12 DIAGNOSIS — M545 Low back pain, unspecified: Secondary | ICD-10-CM | POA: Diagnosis not present

## 2014-08-12 DIAGNOSIS — Z01818 Encounter for other preprocedural examination: Secondary | ICD-10-CM | POA: Diagnosis not present

## 2014-08-12 DIAGNOSIS — Z79899 Other long term (current) drug therapy: Secondary | ICD-10-CM

## 2014-08-12 DIAGNOSIS — Z981 Arthrodesis status: Secondary | ICD-10-CM

## 2014-08-12 DIAGNOSIS — M5126 Other intervertebral disc displacement, lumbar region: Secondary | ICD-10-CM | POA: Diagnosis not present

## 2014-08-12 DIAGNOSIS — M519 Unspecified thoracic, thoracolumbar and lumbosacral intervertebral disc disorder: Secondary | ICD-10-CM | POA: Diagnosis not present

## 2014-08-12 HISTORY — PX: LUMBAR LAMINECTOMY: SHX95

## 2014-08-12 SURGERY — MICRODISCECTOMY LUMBAR LAMINECTOMY
Anesthesia: General | Site: Back | Laterality: Right

## 2014-08-12 MED ORDER — HYDROCODONE-ACETAMINOPHEN 5-325 MG PO TABS
1.0000 | ORAL_TABLET | ORAL | Status: DC | PRN
  Administered 2014-08-13 – 2014-08-14 (×4): 2 via ORAL
  Filled 2014-08-12 (×4): qty 2

## 2014-08-12 MED ORDER — PHENOL 1.4 % MT LIQD: 1.0000 | OROMUCOSAL | Status: DC | PRN

## 2014-08-12 MED ORDER — PANTOPRAZOLE SODIUM 40 MG IV SOLR
40.0000 mg | Freq: Every day | INTRAVENOUS | Status: DC
  Administered 2014-08-12: 40 mg via INTRAVENOUS
  Filled 2014-08-12 (×2): qty 40

## 2014-08-12 MED ORDER — BUPIVACAINE HCL (PF) 0.25 % IJ SOLN
INTRAMUSCULAR | Status: DC | PRN
  Administered 2014-08-12: 30 mL

## 2014-08-12 MED ORDER — HYDROMORPHONE HCL PF 1 MG/ML IJ SOLN
0.2500 mg | INTRAMUSCULAR | Status: DC | PRN
  Administered 2014-08-12 (×2): 0.5 mg via INTRAVENOUS

## 2014-08-12 MED ORDER — NEOSTIGMINE METHYLSULFATE 10 MG/10ML IV SOLN
INTRAVENOUS | Status: AC
Start: 2014-08-12 — End: 2014-08-12
  Filled 2014-08-12: qty 1

## 2014-08-12 MED ORDER — KCL IN DEXTROSE-NACL 20-5-0.45 MEQ/L-%-% IV SOLN
INTRAVENOUS | Status: DC
  Administered 2014-08-12: 21:00:00 via INTRAVENOUS
  Filled 2014-08-12 (×5): qty 1000

## 2014-08-12 MED ORDER — KETOROLAC TROMETHAMINE 30 MG/ML IJ SOLN
INTRAMUSCULAR | Status: AC
Start: 2014-08-12 — End: 2014-08-13
  Filled 2014-08-12: qty 1

## 2014-08-12 MED ORDER — METHOCARBAMOL 500 MG PO TABS: 500.0000 mg | ORAL_TABLET | Freq: Four times a day (QID) | ORAL | Status: DC | PRN

## 2014-08-12 MED ORDER — SODIUM CHLORIDE 0.9 % IJ SOLN: 3.0000 mL | INTRAMUSCULAR | Status: DC | PRN

## 2014-08-12 MED ORDER — ACETAMINOPHEN 325 MG PO TABS
650.0000 mg | ORAL_TABLET | ORAL | Status: DC | PRN
  Administered 2014-08-14: 650 mg via ORAL
  Filled 2014-08-12: qty 2

## 2014-08-12 MED ORDER — ONDANSETRON HCL 4 MG/2ML IJ SOLN
INTRAMUSCULAR | Status: AC
  Filled 2014-08-12: qty 2

## 2014-08-12 MED ORDER — THROMBIN 20000 UNITS EX SOLR
CUTANEOUS | Status: AC
  Filled 2014-08-12: qty 20000

## 2014-08-12 MED ORDER — LACTATED RINGERS IV SOLN
INTRAVENOUS | Status: DC
  Administered 2014-08-12 (×2): via INTRAVENOUS

## 2014-08-12 MED ORDER — ROCURONIUM BROMIDE 100 MG/10ML IV SOLN
INTRAVENOUS | Status: DC | PRN
  Administered 2014-08-12: 30 mg via INTRAVENOUS

## 2014-08-12 MED ORDER — DOCUSATE SODIUM 100 MG PO CAPS
100.0000 mg | ORAL_CAPSULE | Freq: Two times a day (BID) | ORAL | Status: DC
  Administered 2014-08-12 – 2014-08-14 (×4): 100 mg via ORAL
  Filled 2014-08-12 (×5): qty 1

## 2014-08-12 MED ORDER — LIDOCAINE HCL (CARDIAC) 20 MG/ML IV SOLN
INTRAVENOUS | Status: AC
  Filled 2014-08-12: qty 5

## 2014-08-12 MED ORDER — PROPOFOL 10 MG/ML IV BOLUS
INTRAVENOUS | Status: AC
  Filled 2014-08-12: qty 20

## 2014-08-12 MED ORDER — OXYCODONE HCL 5 MG/5ML PO SOLN: 5.0000 mg | Freq: Once | ORAL | Status: DC | PRN

## 2014-08-12 MED ORDER — ONDANSETRON HCL 4 MG/2ML IJ SOLN: 4.0000 mg | INTRAMUSCULAR | Status: DC | PRN

## 2014-08-12 MED ORDER — CEFAZOLIN SODIUM 1-5 GM-% IV SOLN
1.0000 g | Freq: Three times a day (TID) | INTRAVENOUS | Status: AC
  Administered 2014-08-12 – 2014-08-13 (×2): 1 g via INTRAVENOUS
  Filled 2014-08-12 (×2): qty 50

## 2014-08-12 MED ORDER — HYDROMORPHONE HCL PF 1 MG/ML IJ SOLN
INTRAMUSCULAR | Status: AC
  Filled 2014-08-12: qty 1

## 2014-08-12 MED ORDER — GLYCOPYRROLATE 0.2 MG/ML IJ SOLN
INTRAMUSCULAR | Status: DC | PRN
  Administered 2014-08-12: 0.4 mg via INTRAVENOUS

## 2014-08-12 MED ORDER — ZOLPIDEM TARTRATE 5 MG PO TABS: 5.0000 mg | ORAL_TABLET | Freq: Every evening | ORAL | Status: DC | PRN

## 2014-08-12 MED ORDER — ONDANSETRON HCL 4 MG/2ML IJ SOLN
INTRAMUSCULAR | Status: DC | PRN
Start: 2014-08-12 — End: 2014-08-12
  Administered 2014-08-12: 4 mg via INTRAVENOUS

## 2014-08-12 MED ORDER — THROMBIN 20000 UNITS EX SOLR
CUTANEOUS | Status: DC | PRN
  Administered 2014-08-12: 11:00:00 via TOPICAL

## 2014-08-12 MED ORDER — METHOCARBAMOL 1000 MG/10ML IJ SOLN
500.0000 mg | INTRAVENOUS | Status: AC
  Administered 2014-08-12: 500 mg via INTRAVENOUS
  Filled 2014-08-12: qty 5

## 2014-08-12 MED ORDER — BUPIVACAINE HCL (PF) 0.25 % IJ SOLN
INTRAMUSCULAR | Status: AC
  Filled 2014-08-12: qty 30

## 2014-08-12 MED ORDER — FINASTERIDE 5 MG PO TABS
5.0000 mg | ORAL_TABLET | Freq: Every day | ORAL | Status: DC
  Administered 2014-08-12 – 2014-08-13 (×2): 5 mg via ORAL
  Filled 2014-08-12 (×3): qty 1

## 2014-08-12 MED ORDER — OXYCODONE-ACETAMINOPHEN 5-325 MG PO TABS: 1.0000 | ORAL_TABLET | ORAL | Status: DC | PRN

## 2014-08-12 MED ORDER — KETOROLAC TROMETHAMINE 30 MG/ML IJ SOLN
30.0000 mg | Freq: Four times a day (QID) | INTRAMUSCULAR | Status: AC
  Administered 2014-08-12 – 2014-08-13 (×4): 30 mg via INTRAVENOUS
  Filled 2014-08-12 (×3): qty 1

## 2014-08-12 MED ORDER — 0.9 % SODIUM CHLORIDE (POUR BTL) OPTIME
TOPICAL | Status: DC | PRN
  Administered 2014-08-12: 1000 mL

## 2014-08-12 MED ORDER — OXYCODONE HCL 5 MG PO TABS: 5.0000 mg | ORAL_TABLET | Freq: Once | ORAL | Status: DC | PRN

## 2014-08-12 MED ORDER — ATORVASTATIN CALCIUM 40 MG PO TABS
40.0000 mg | ORAL_TABLET | Freq: Every day | ORAL | Status: DC
  Administered 2014-08-12: 40 mg via ORAL
  Filled 2014-08-12 (×3): qty 1

## 2014-08-12 MED ORDER — LIDOCAINE HCL (CARDIAC) 20 MG/ML IV SOLN
INTRAVENOUS | Status: DC | PRN
  Administered 2014-08-12: 60 mg via INTRAVENOUS

## 2014-08-12 MED ORDER — PROPOFOL 10 MG/ML IV BOLUS
INTRAVENOUS | Status: DC | PRN
  Administered 2014-08-12: 100 mg via INTRAVENOUS

## 2014-08-12 MED ORDER — OXYCODONE-ACETAMINOPHEN 5-325 MG PO TABS
1.0000 | ORAL_TABLET | ORAL | Status: DC | PRN
Start: 2014-08-12 — End: 2015-05-03

## 2014-08-12 MED ORDER — FENTANYL CITRATE 0.05 MG/ML IJ SOLN
INTRAMUSCULAR | Status: AC
  Filled 2014-08-12: qty 5

## 2014-08-12 MED ORDER — BISACODYL 10 MG RE SUPP: 10.0000 mg | Freq: Every day | RECTAL | Status: DC | PRN

## 2014-08-12 MED ORDER — MENTHOL 3 MG MT LOZG
1.0000 | LOZENGE | OROMUCOSAL | Status: DC | PRN
  Filled 2014-08-12: qty 9

## 2014-08-12 MED ORDER — GLYCOPYRROLATE 0.2 MG/ML IJ SOLN
INTRAMUSCULAR | Status: AC
  Filled 2014-08-12: qty 2

## 2014-08-12 MED ORDER — SODIUM CHLORIDE 0.9 % IV SOLN: 250.0000 mL | INTRAVENOUS | Status: DC

## 2014-08-12 MED ORDER — SODIUM CHLORIDE 0.9 % IJ SOLN
3.0000 mL | Freq: Two times a day (BID) | INTRAMUSCULAR | Status: DC
  Administered 2014-08-13: 3 mL via INTRAVENOUS

## 2014-08-12 MED ORDER — ROCURONIUM BROMIDE 50 MG/5ML IV SOLN
INTRAVENOUS | Status: AC
  Filled 2014-08-12: qty 1

## 2014-08-12 MED ORDER — METHOCARBAMOL 1000 MG/10ML IJ SOLN
500.0000 mg | Freq: Four times a day (QID) | INTRAMUSCULAR | Status: DC | PRN
  Filled 2014-08-12: qty 5

## 2014-08-12 MED ORDER — POLYETHYLENE GLYCOL 3350 17 G PO PACK
17.0000 g | PACK | Freq: Every day | ORAL | Status: DC | PRN
  Administered 2014-08-14: 17 g via ORAL
  Filled 2014-08-12: qty 1

## 2014-08-12 MED ORDER — FLEET ENEMA 7-19 GM/118ML RE ENEM: 1.0000 | ENEMA | Freq: Once | RECTAL | Status: AC | PRN

## 2014-08-12 MED ORDER — METHOCARBAMOL 500 MG PO TABS
500.0000 mg | ORAL_TABLET | Freq: Four times a day (QID) | ORAL | Status: DC | PRN
  Administered 2014-08-13 – 2014-08-14 (×3): 500 mg via ORAL
  Filled 2014-08-12 (×3): qty 1

## 2014-08-12 MED ORDER — ACETAMINOPHEN 650 MG RE SUPP: 650.0000 mg | RECTAL | Status: DC | PRN

## 2014-08-12 MED ORDER — FENTANYL CITRATE 0.05 MG/ML IJ SOLN
INTRAMUSCULAR | Status: DC | PRN
  Administered 2014-08-12: 100 ug via INTRAVENOUS
  Administered 2014-08-12 (×2): 25 ug via INTRAVENOUS
  Administered 2014-08-12: 100 ug via INTRAVENOUS

## 2014-08-12 MED ORDER — MORPHINE SULFATE 2 MG/ML IJ SOLN: 1.0000 mg | INTRAMUSCULAR | Status: DC | PRN

## 2014-08-12 MED ORDER — PROMETHAZINE HCL 25 MG/ML IJ SOLN: 6.2500 mg | INTRAMUSCULAR | Status: DC | PRN

## 2014-08-12 MED ORDER — NEOSTIGMINE METHYLSULFATE 10 MG/10ML IV SOLN
INTRAVENOUS | Status: DC | PRN
  Administered 2014-08-12: 3 mg via INTRAVENOUS

## 2014-08-12 SURGICAL SUPPLY — 57 items
BUR ROUND FLUTED 4 SOFT TCH (BURR) ×2 IMPLANT
BUR ROUND FLUTED 4MM SOFT TCH (BURR) ×1
CORDS BIPOLAR (ELECTRODE) ×3 IMPLANT
COVER SURGICAL LIGHT HANDLE (MISCELLANEOUS) ×3 IMPLANT
DERMABOND ADVANCED (GAUZE/BANDAGES/DRESSINGS) ×2
DERMABOND ADVANCED .7 DNX12 (GAUZE/BANDAGES/DRESSINGS) ×1 IMPLANT
DRAPE C-ARM 42X72 X-RAY (DRAPES) ×3 IMPLANT
DRAPE MICROSCOPE LEICA (MISCELLANEOUS) ×3 IMPLANT
DRAPE PROXIMA HALF (DRAPES) ×6 IMPLANT
DRSG EMULSION OIL 3X3 NADH (GAUZE/BANDAGES/DRESSINGS) IMPLANT
DRSG MEPILEX BORDER 4X4 (GAUZE/BANDAGES/DRESSINGS) ×3 IMPLANT
DRSG MEPILEX BORDER 4X8 (GAUZE/BANDAGES/DRESSINGS) IMPLANT
DURAPREP 26ML APPLICATOR (WOUND CARE) ×3 IMPLANT
DURASEAL SPINE SEALANT 3ML (MISCELLANEOUS) IMPLANT
ELECT REM PT RETURN 9FT ADLT (ELECTROSURGICAL) ×3
ELECTRODE REM PT RTRN 9FT ADLT (ELECTROSURGICAL) ×1 IMPLANT
GAUZE SPONGE 4X4 12PLY STRL (GAUZE/BANDAGES/DRESSINGS) ×3 IMPLANT
GLOVE BIOGEL PI IND STRL 7.5 (GLOVE) ×1 IMPLANT
GLOVE BIOGEL PI IND STRL 8 (GLOVE) ×1 IMPLANT
GLOVE BIOGEL PI INDICATOR 7.5 (GLOVE) ×2
GLOVE BIOGEL PI INDICATOR 8 (GLOVE) ×2
GLOVE ECLIPSE 7.0 STRL STRAW (GLOVE) ×6 IMPLANT
GLOVE ORTHO TXT STRL SZ7.5 (GLOVE) ×6 IMPLANT
GLOVE SURG SS PI 7.0 STRL IVOR (GLOVE) ×3 IMPLANT
GOWN STRL REUS W/ TWL LRG LVL3 (GOWN DISPOSABLE) ×4 IMPLANT
GOWN STRL REUS W/TWL LRG LVL3 (GOWN DISPOSABLE) ×8
KIT BASIN OR (CUSTOM PROCEDURE TRAY) ×3 IMPLANT
KIT ROOM TURNOVER OR (KITS) ×3 IMPLANT
MANIFOLD NEPTUNE II (INSTRUMENTS) IMPLANT
NDL SUT .5 MAYO 1.404X.05X (NEEDLE) ×1 IMPLANT
NEEDLE 22X1 1/2 (OR ONLY) (NEEDLE) ×3 IMPLANT
NEEDLE MAYO TAPER (NEEDLE) ×2
NEEDLE SPNL 18GX3.5 QUINCKE PK (NEEDLE) ×3 IMPLANT
NS IRRIG 1000ML POUR BTL (IV SOLUTION) ×3 IMPLANT
PACK LAMINECTOMY ORTHO (CUSTOM PROCEDURE TRAY) ×3 IMPLANT
PAD ARMBOARD 7.5X6 YLW CONV (MISCELLANEOUS) ×6 IMPLANT
PATTIES SURGICAL .5 X.5 (GAUZE/BANDAGES/DRESSINGS) ×3 IMPLANT
PATTIES SURGICAL .75X.75 (GAUZE/BANDAGES/DRESSINGS) ×3 IMPLANT
SPONGE GAUZE 4X4 12PLY STER LF (GAUZE/BANDAGES/DRESSINGS) ×3 IMPLANT
SPONGE SURGIFOAM ABS GEL 12-7 (HEMOSTASIS) ×3 IMPLANT
SPONGE SURGIFOAM ABS GEL SZ50 (HEMOSTASIS) ×3 IMPLANT
SUT NURALON 4 0 TR CR/8 (SUTURE) ×3 IMPLANT
SUT VIC AB 0 CT1 27 (SUTURE) ×2
SUT VIC AB 0 CT1 27XBRD ANBCTR (SUTURE) ×1 IMPLANT
SUT VIC AB 1 CT1 27 (SUTURE) ×4
SUT VIC AB 1 CT1 27XBRD ANBCTR (SUTURE) ×2 IMPLANT
SUT VIC AB 2-0 CT1 27 (SUTURE) ×2
SUT VIC AB 2-0 CT1 TAPERPNT 27 (SUTURE) ×1 IMPLANT
SUT VICRYL 0 TIES 12 18 (SUTURE) ×6 IMPLANT
SUT VICRYL 4-0 PS2 18IN ABS (SUTURE) ×3 IMPLANT
SUT VICRYL AB 2 0 TIES (SUTURE) ×3 IMPLANT
SYR 20ML ECCENTRIC (SYRINGE) ×3 IMPLANT
SYR BULB IRRIGATION 50ML (SYRINGE) ×3 IMPLANT
SYR CONTROL 10ML LL (SYRINGE) ×3 IMPLANT
TOWEL OR 17X24 6PK STRL BLUE (TOWEL DISPOSABLE) ×3 IMPLANT
TOWEL OR 17X26 10 PK STRL BLUE (TOWEL DISPOSABLE) ×3 IMPLANT
WATER STERILE IRR 1000ML POUR (IV SOLUTION) ×3 IMPLANT

## 2014-08-12 NOTE — Plan of Care (Signed)
Problem: Consults Goal: Diagnosis - Spinal Surgery Right L2-L3

## 2014-08-12 NOTE — Brief Op Note (Signed)
08/12/2014  12:51 PM  PATIENT:  Jim Tucker  73 y.o. male  PRE-OPERATIVE DIAGNOSIS:  Right L2-3 HNP  POST-OPERATIVE DIAGNOSIS:  Right L2-3 HNP  PROCEDURE:  Procedure(s): Right L2-3 Hemilaminectomy, Removal HNP (Right)  (previous decompression- redo )  SURGEON:  Surgeon(s) and Role:    * Marybelle Killings, MD - Primary  PHYSICIAN ASSISTANT: Phillips Hay PAC  ASSISTANTS: none   ANESTHESIA:   general  EBL:  Total I/O In: 1000 [I.V.:1000] Out: 200 [Blood:200]  BLOOD ADMINISTERED:none  DRAINS: none   LOCAL MEDICATIONS USED:  MARCAINE     SPECIMEN:  No Specimen  DISPOSITION OF SPECIMEN:  N/A  COUNTS:  YES  TOURNIQUET:  * No tourniquets in log *  DICTATION: .Note written in EPIC  PLAN OF CARE: Admit for overnight observation  PATIENT DISPOSITION:  PACU - hemodynamically stable.   Delay start of Pharmacological VTE agent (>24hrs) due to surgical blood loss or risk of bleeding: yes

## 2014-08-12 NOTE — Anesthesia Postprocedure Evaluation (Signed)
  Anesthesia Post-op Note  Patient: Jim Tucker  Procedure(s) Performed: Procedure(s): Right L2-3 Hemilaminectomy, Removal HNP (Right)  Patient Location: PACU  Anesthesia Type:General  Level of Consciousness: awake, alert , oriented and patient cooperative  Airway and Oxygen Therapy: Patient Spontanous Breathing  Post-op Pain: none  Post-op Assessment: Post-op Vital signs reviewed, Patient's Cardiovascular Status Stable, Respiratory Function Stable, Patent Airway, No signs of Nausea or vomiting and Pain level controlled  Post-op Vital Signs: Reviewed and stable  Last Vitals:  Filed Vitals:   08/12/14 1400  BP: 105/68  Pulse: 59  Temp:   Resp: 12    Complications: No apparent anesthesia complications

## 2014-08-12 NOTE — Progress Notes (Signed)
Utilization review completed.  

## 2014-08-12 NOTE — Anesthesia Preprocedure Evaluation (Addendum)
Anesthesia Evaluation  Patient identified by MRN, date of birth, ID band Patient awake    Reviewed: Allergy & Precautions, H&P , NPO status , Patient's Chart, lab work & pertinent test results  History of Anesthesia Complications Negative for: history of anesthetic complications  Airway Mallampati: II TM Distance: <3 FB Neck ROM: Full    Dental  (+) Teeth Intact, Dental Advisory Given   Pulmonary former smoker,  breath sounds clear to auscultation        Cardiovascular METS: 5 - 7 Mets Rhythm:Regular Rate:Normal  Exercise stress test negative in 2013. Good LV function. 97% maximal HR obtained.  Pt endorses 2-3 mile walking without CP or SOB.   Neuro/Psych negative neurological ROS  negative psych ROS   GI/Hepatic negative GI ROS, Neg liver ROS,   Endo/Other  negative endocrine ROS  Renal/GU negative Renal ROS  negative genitourinary   Musculoskeletal negative musculoskeletal ROS (+)   Abdominal   Peds  Hematology negative hematology ROS (+)   Anesthesia Other Findings   Reproductive/Obstetrics negative OB ROS                          Anesthesia Physical Anesthesia Plan  ASA: I  Anesthesia Plan: General   Post-op Pain Management:    Induction: Intravenous  Airway Management Planned: Oral ETT  Additional Equipment: None  Intra-op Plan:   Post-operative Plan: Extubation in OR  Informed Consent: I have reviewed the patients History and Physical, chart, labs and discussed the procedure including the risks, benefits and alternatives for the proposed anesthesia with the patient or authorized representative who has indicated his/her understanding and acceptance.   Dental advisory given  Plan Discussed with: CRNA and Anesthesiologist  Anesthesia Plan Comments:        Anesthesia Quick Evaluation

## 2014-08-12 NOTE — Anesthesia Procedure Notes (Addendum)
Procedure Name: Intubation Date/Time: 08/12/2014 10:12 AM Performed by: Manuela Schwartz B Pre-anesthesia Checklist: Patient identified, Emergency Drugs available, Suction available, Patient being monitored and Timeout performed Patient Re-evaluated:Patient Re-evaluated prior to inductionOxygen Delivery Method: Circle system utilized Preoxygenation: Pre-oxygenation with 100% oxygen Intubation Type: IV induction Ventilation: Mask ventilation without difficulty Grade View: Grade I Tube type: Oral Tube size: 7.5 mm Number of attempts: 2 (unable to view cords with MAC 3 blade, grade one view with glidescope) Airway Equipment and Method: Stylet and Video-laryngoscopy Placement Confirmation: ETT inserted through vocal cords under direct vision,  positive ETCO2 and breath sounds checked- equal and bilateral Secured at: 21 cm Tube secured with: Tape Dental Injury: Teeth and Oropharynx as per pre-operative assessment

## 2014-08-12 NOTE — Interval H&P Note (Signed)
History and Physical Interval Note:  08/12/2014 9:26 AM  [place]  has presented today for surgery, with the diagnosis of Right L2-3 HNP  The various methods of treatment have been discussed with the patient and family. After consideration of risks, benefits and other options for treatment, the patient has consented to  Procedure(s): Right L2-3 Hemilaminectomy, Removal HNP (Right) as a surgical intervention .  The patient's history has been reviewed, patient examined, no change in status, stable for surgery.  I have reviewed the patient's chart and labs.  Questions were answered to the patient's satisfaction.     Jim Tucker

## 2014-08-12 NOTE — Discharge Instructions (Signed)
No lifting greater than 10 lbs. Avoid bending, stooping and twisting. Walk in house for first week them may start to get out slowly increasing distance up to one mile by 3 weeks post op. Keep incision dry for 3 days, may use tegaderm or similar water impervious dressing. Ice packs to back as needed for pain and swelling Change dressing daily or as needed.

## 2014-08-12 NOTE — Transfer of Care (Signed)
Immediate Anesthesia Transfer of Care Note  Patient: Jim Tucker  Procedure(s) Performed: Procedure(s): Right L2-3 Hemilaminectomy, Removal HNP (Right)  Patient Location: PACU  Anesthesia Type:General  Level of Consciousness: awake, alert  and oriented  Airway & Oxygen Therapy: Patient Spontanous Breathing  Post-op Assessment: Report given to PACU RN and Post -op Vital signs reviewed and stable  Post vital signs: Reviewed and stable  Complications: No apparent anesthesia complications

## 2014-08-13 ENCOUNTER — Encounter (HOSPITAL_COMMUNITY): Payer: Self-pay | Admitting: Orthopedic Surgery

## 2014-08-13 DIAGNOSIS — M5126 Other intervertebral disc displacement, lumbar region: Secondary | ICD-10-CM | POA: Diagnosis present

## 2014-08-13 MED ORDER — PANTOPRAZOLE SODIUM 40 MG PO TBEC
40.0000 mg | DELAYED_RELEASE_TABLET | Freq: Every day | ORAL | Status: DC
  Administered 2014-08-14 (×2): 40 mg via ORAL
  Filled 2014-08-13 (×2): qty 1

## 2014-08-13 NOTE — Op Note (Signed)
Jim Tucker, SURGEON            ACCOUNT NO.:  [id number]  MEDICAL RECORD NO.:  [id number]  LOCATION:  5N01C                        FACILITY:  [facility]  PHYSICIAN:  Cruz Devilla C. Lorin Mercy, M.D.    DATE OF BIRTH:  11-25-1941  DATE OF PROCEDURE:  08/12/2014 DATE OF DISCHARGE:                              OPERATIVE REPORT   PREOPERATIVE DIAGNOSIS:  Right L2-3 with free fragment.  POSTOPERATIVE DIAGNOSIS:  Right L2-3 with free fragment.  PROCEDURES:  Re-exploration in an area of previous decompression, removal of right L2-3 herniated nucleus pulposus, removal of free fragment.  SURGEON:  Bruce Churilla C. Lorin Mercy, M.D.  ASSISTANT:  Epimenio Foot, PA-C, medically necessary and present for the entire procedure.  ESTIMATED BLOOD LOSS:  200 mL.  DRAINS:  None.  COMPLICATIONS:  None.  INDICATIONS:  This 73 year old male had previous surgery done in [place] many years ago, with fusion at L4-5, instrumented fusion, decompression at L3-4 and also at L2-3, with a small remnant of top portion of L2 spinous process.  There was __________ levels of free formation of bone at the midline, extensive scar tissue, and the patient had ruptured the L2-3 disk with caudal migration, severe nerve root compression, and leg weakness with falling.  He failed conservative treatment including epidural steroid injections.  MRI scan showed large disk, caudally migrated down by the pedicle at L3 on the right.  Broad- based disk protrusion at the L2-3 level.  DESCRIPTION OF PROCEDURE:  After induction of general anesthesia, the patient was placed prone.  Chest rolls, careful padding, and positioning.  Back was prepped with DuraPrep.  The area was squared with towels, Betadine and Steri-Drape.  Laminectomy sheet drapes.  Time-out procedure completed.  Midline incision was made after needle localization with spinal needle at the L2-3 level.  Old scar that from L2 to S1 was noted.  Extensive scar tissue was present and only  the top remnant of the L2 spinous process was present.  It was difficult to determine whether facets were located in the dense scar tissue.  Self- retaining retractors were placed.  Portion of the spinous process were removed at L2, going down to the lamina, identifying the dura at that area and then following the lateral gutter on the right side.  Several x- rays were taken during the procedure for localization and C-arm was brought in as well for appropriate level.  Once decompression was performed down to the level of the L2-3 disk space.  Nerve root was decompressed.  There was some bone oozing, bone wax, thrombin-soaked Gelfoam patties were used intermittently.  Scar tissue was resected. One area had some thinning of the dura.  There was a small bubble with no CSF leak and a single Nurolon 4-0 was placed, pulling some scar tissue over the top of this, checked under raw saw __________ and continued dissection along the lateral gutter down to the L2-3 large disk protrusion.  There was a large epidural vein in the gutter which was preserved.  Annulus was incised.  Midline was protected with D'Errico retractor.  Chunks of ligament which had a lot of water content in them were removed, it was __________ up and down.  Micro pituitaries  were used.  Chunks of ligament were removed caudally and using a ball- tip nerve hook, multiple large chunks of disk were teased out from underneath the nerve root at the level of the pedicle, grasped with micropituitary and removed.  This continued __________ good decompression.  Dura was softer, was easier to retract, and hockey stick could be then swept anterior to the dura with no areas of compression. Nerve root was free.  Sac was resected where the disk herniation fragment had been present.  Portion of this had a small piece of endplate attached to it.  The endplate was pressing against the ventral surface of the L3 nerve root.  Once decompression was  finished, irrigation with saline solution was performed.  The small bleb was rechecked, still no CSF leak and Valsalva to 30 cm again.  The fascia was closed with #1 Vicryl, 2-0 Vicryl in the subcutaneous tissue, subcuticular closure, Dermabond, postop dressing, and transferred to the recovery room.  Instrument count and needle count was correct.     Kennedey Digilio C. Lorin Mercy, M.D.     MCY/MEDQ  D:  08/12/2014  T:  08/12/2014  Job:  [id number]

## 2014-08-13 NOTE — Progress Notes (Addendum)
Subjective: 1 Day Post-Op Procedure(s) (LRB): Right L2-3 Hemilaminectomy, Removal HNP (Right) Patient reports pain as moderate.   Back pain moderate to severe no leg pain.  Objective: Vital signs in last 24 hours: Temp:  [97.1 F (36.2 C)-97.9 F (36.6 C)] 97.9 F (36.6 C) (09/05 0528) Pulse Rate:  [53-68] 62 (09/05 0528) Resp:  [12-16] 16 (09/05 0528) BP: (100-114)/(57-68) 105/64 mmHg (09/05 0528) SpO2:  [92 %-96 %] 94 % (09/05 0528)  Intake/Output from previous day: 09/04 0701 - 09/05 0700 In: 2500 [P.O.:240; I.V.:2210; IV Piggyback:50] Out: 200 [Blood:200] Intake/Output this shift: Total I/O In: -  Out: 200 [Urine:200]   Recent Labs  08/10/14 0956  HGB 17.4*    Recent Labs  08/10/14 0956  WBC 7.7  RBC 5.59  HCT 50.1  PLT 187    Recent Labs  08/10/14 0956  NA 139  K 4.1  CL 104  CO2 21  BUN 20  CREATININE 0.61  GLUCOSE 147*  CALCIUM 9.6    Recent Labs  08/10/14 0956  INR 0.97    Neurologically intact  Assessment/Plan: 1 Day Post-Op Procedure(s) (LRB): Right L2-3 Hemilaminectomy, Removal HNP (Right) Up with therapy Home Sunday.   Had prolonged surgery due to extensive scar tissue from previous surgery.   Lives with elderly wife. Plan Home Sunday  Jim Tucker C 08/13/2014, 9:10 AM

## 2014-08-13 NOTE — Progress Notes (Signed)
Physical Therapy Evaluation Patient Details Name: Jim Tucker MRN: [id number] DOB: [birth year] Today's Date: 08/13/2014   History of Present Illness  73 yo male post Right L2-3 Hemilaminectomy, Removal HNP (Right) on 08/12/14.  Prior to surgery had radicular symptoms R LE to foot, now resolved per patient.  Clinical Impression  Patient demonstrates acute surgical pain, reports all radicular symptoms in leg have resolved from surgery.  Instructed in 'log-roll' technique for bed mobility, provided handout for back precautions, patient is able to ambulate without device and SBA, asked patient to use walker while in hospital for safety.  Patient is Modified Independent with all mobility at this time.    Follow Up Recommendations No PT follow up    Equipment Recommendations  None recommended by PT (Patient has cane at home)    Recommendations for Other Services       Precautions / Restrictions Precautions Precautions: Back Precaution Booklet Issued: Yes (comment) Precaution Comments:  (No bending, arching, or twisting.) Restrictions Weight Bearing Restrictions: No      Mobility  Bed Mobility Overal bed mobility: Independent             General bed mobility comments: instructed in 'log-roll' technique  Transfers Overall transfer level: Modified independent Equipment used: Rolling walker (2 wheeled)             General transfer comment: good safety  Ambulation/Gait Ambulation/Gait assistance: Modified independent (Device/Increase time) Ambulation Distance (Feet): 300 Feet Assistive device: Rolling walker (2 wheeled);None Gait Pattern/deviations: WFL(Within Functional Limits) Gait velocity: normal Gait velocity interpretation: at or above normal speed for age/gender General Gait Details: walker for safety by self, can ambulate without device and SBA  Stairs Stairs: Yes Stairs assistance: Supervision Stair Management: One rail Right;Alternating pattern Number of  Stairs: 3 General stair comments: antalgia with decending (pulling on surgical site)  Wheelchair Mobility    Modified Rankin (Stroke Patients Only)       Balance Overall balance assessment: Modified Independent                                           Pertinent Vitals/Pain Pain Assessment: 0-10 Pain Score: 3  Pain Location: Low back, surgical site Pain Intervention(s): Monitored during session;Premedicated before session    Home Living Family/patient expects to be discharged to:: Private residence Living Arrangements: Spouse/significant other Available Help at Discharge: Family Type of Home: House Home Access: Stairs to enter Entrance Stairs-Rails: None Entrance Stairs-Number of Steps: 2 Home Layout: Two level;Able to live on main level with bedroom/bathroom Home Equipment: Kasandra Knudsen - single point      Prior Function Level of Independence: Independent with assistive device(s)         Comments: Cane for pain prior to surgery     Hand Dominance        Extremity/Trunk Assessment   Upper Extremity Assessment: Overall WFL for tasks assessed           Lower Extremity Assessment: Overall WFL for tasks assessed      Cervical / Trunk Assessment: Normal  Communication   Communication: No difficulties  Cognition Arousal/Alertness: Awake/alert Behavior During Therapy: WFL for tasks assessed/performed Overall Cognitive Status: Within Functional Limits for tasks assessed                      General Comments      Exercises  Assessment/Plan    PT Assessment Patent does not need any further PT services  PT Diagnosis     PT Problem List    PT Treatment Interventions     PT Goals (Current goals can be found in the Care Plan section) Acute Rehab PT Goals Patient Stated Goal: To go home tomorrow PT Goal Formulation: With patient Time For Goal Achievement: 2014-09-04 Potential to Achieve Goals: Good    Frequency      Barriers to discharge        Co-evaluation               End of Session Equipment Utilized During Treatment: Gait belt Activity Tolerance: Patient tolerated treatment well Patient left: in chair;with call bell/phone within reach Nurse Communication: Mobility status         Time: 1110-1150 PT Time Calculation (min): 40 min   Charges:   PT Evaluation $Initial PT Evaluation Tier I: 1 Procedure PT Treatments $Gait Training: 8-22 mins $Therapeutic Activity: 8-22 mins   PT G Codes:          Jim Tucker L 09/04/2014, 1:28 PM

## 2014-08-14 NOTE — Progress Notes (Signed)
Subjective: 2 Days Post-Op Procedure(s) (LRB): Right L2-3 Hemilaminectomy, Removal HNP (Right) Patient reports pain as mild.    Objective: Vital signs in last 24 hours: Temp:  [98.3 F (36.8 C)-98.6 F (37 C)] 98.6 F (37 C) (09/06 3762) Pulse Rate:  [67-73] 70 (09/06 0619) Resp:  [16] 16 (09/06 0619) BP: (111-124)/(60-66) 124/60 mmHg (09/06 0619) SpO2:  [95 %-96 %] 96 % (09/06 0619)  Intake/Output from previous day: 09/05 0701 - 09/06 0700 In: 1320 [P.O.:1320] Out: 200 [Urine:200] Intake/Output this shift:    No results found for this basename: HGB,  in the last 72 hours No results found for this basename: WBC, RBC, HCT, PLT,  in the last 72 hours No results found for this basename: NA, K, CL, CO2, BUN, CREATININE, GLUCOSE, CALCIUM,  in the last 72 hours No results found for this basename: LABPT, INR,  in the last 72 hours  Neurologically intact  Assessment/Plan: 2 Days Post-Op Procedure(s) (LRB): Right L2-3 Hemilaminectomy, Removal HNP (Right) Plan:  discharge home   Office one    8 to 10 days.   Iretha Kirley C 08/14/2014, 9:04 AM

## 2014-08-16 ENCOUNTER — Encounter (HOSPITAL_COMMUNITY): Payer: Self-pay | Admitting: Orthopaedic Surgery

## 2014-09-01 DIAGNOSIS — Z23 Encounter for immunization: Secondary | ICD-10-CM | POA: Diagnosis not present

## 2014-09-05 NOTE — Discharge Summary (Signed)
Physician Discharge Summary  Patient ID: Jim Tucker MRN: [id number] DOB/AGE: Feb 06, 1941 73 y.o.  Admit date: 08/12/2014 Discharge date: 08/14/2014  Admission Diagnoses:  Herniated lumbar disc without myelopathy right L2-3 with free fragment.  Discharge Diagnoses:  Principal Problem:   Herniated lumbar disc without myelopathy Active Problems:   HNP (herniated nucleus pulposus), lumbar   Past Medical History  Diagnosis Date  . LBP (low back pain)     HNP  . Hyperlipidemia     takes Atorvastatin daily  . BPH (benign prostatic hypertrophy)     takes Proscar daily  . Arthritis   . History of colon polyps   . Internal hemorrhoid   . Diverticulosis   . Numbness and tingling     in right leg  . Nocturia     Surgeries: Procedure(s): Right L2-3 Hemilaminectomy, Removal HNP and free fragment on 08/12/2014   Consultants (if any):  none  Discharged Condition: Improved  Hospital Course: Jim Tucker is an 73 y.o. male who was admitted 08/12/2014 with a diagnosis of Herniated lumbar disc without myelopathy and went to the operating room on 08/12/2014 and underwent the above named procedures.    He was given perioperative antibiotics:      Anti-infectives   Start     Dose/Rate Route Frequency Ordered Stop   08/12/14 1800  ceFAZolin (ANCEF) IVPB 1 g/50 mL premix     1 g 100 mL/hr over 30 Minutes Intravenous Every 8 hours 08/12/14 1419 08/13/14 0214   08/12/14 0600  ceFAZolin (ANCEF) IVPB 2 g/50 mL premix     2 g 100 mL/hr over 30 Minutes Intravenous On call to O.R. 08/11/14 1416 08/12/14 1020    .  He was given sequential compression devices, early ambulationNo lifting greater than 10 lbs.  He benefited maximally from the hospital stay and there were no complications.    Recent vital signs:  Filed Vitals:   08/14/14 0619  BP: 124/60  Pulse: 70  Temp: 98.6 F (37 C)  Resp: 16    Recent laboratory studies:  Lab Results  Component Value Date   HGB 17.4* 08/10/2014    HGB 16.7 07/19/2013   HGB 16.7 10/13/2012   Lab Results  Component Value Date   WBC 7.7 08/10/2014   PLT 187 08/10/2014   Lab Results  Component Value Date   INR 0.97 08/10/2014   Lab Results  Component Value Date   NA 139 08/10/2014   K 4.1 08/10/2014   CL 104 08/10/2014   CO2 21 08/10/2014   BUN 20 08/10/2014   CREATININE 0.61 08/10/2014   GLUCOSE 147* 08/10/2014    Discharge Medications:     Medication List         atorvastatin 40 MG tablet  Commonly known as:  LIPITOR  Take 1 tablet (40 mg total) by mouth daily.     finasteride 5 MG tablet  Commonly known as:  PROSCAR  Take 5 mg by mouth daily.     methocarbamol 500 MG tablet  Commonly known as:  ROBAXIN  Take 1 tablet (500 mg total) by mouth every 6 (six) hours as needed for muscle spasms (spasm).     oxyCODONE-acetaminophen 5-325 MG per tablet  Commonly known as:  ROXICET  Take 1-2 tablets by mouth every 4 (four) hours as needed for severe pain.        Diagnostic Studies: Dg Chest 2 View  08/10/2014   CLINICAL DATA:  Preop for lumbar spine surgery  EXAM: CHEST  2 VIEW  COMPARISON:  None.  FINDINGS: No active infiltrate or effusion is seen. Mediastinal hilar contours are unremarkable. The heart is within normal limits in size. No acute bony abnormality is seen.  IMPRESSION: No active cardiopulmonary disease.   Electronically Signed   By: Ivar Drape M.D.   On: 08/10/2014 10:21   Dg Lumbar Spine 2-3 Views  08/12/2014   CLINICAL DATA:  Lumbar laminectomy  EXAM: LUMBAR SPINE - 2-3 VIEW; DG C-ARM 61-120 MIN  COMPARISON:  Previous intraoperative films.  FINDINGS: Two spot films were obtained intraoperatively. 1 seconds of fluoroscopy was utilized. The images show evidence of prior fixation at L4-5 stable in appearance. Surgical instrument is noted within the spinal canal at the L2-3 level. Correlation with the operative findings is recommended.   Electronically Signed   By: Inez Catalina M.D.   On: 08/12/2014 13:31   Dg Lumbar Spine  2-3 Views  08/12/2014   CLINICAL DATA:  Lumbar spine fusion.  EXAM: LUMBAR SPINE - 2-3 VIEW  COMPARISON:  MRI lumbar spine 05/27/2014.  FINDINGS: Metallic markers along the posterior aspect of the lumbar spine at L2-L3 on image number 2 and L1-L2 on image number 1. L4-L5 posterior fusion. Good anatomic alignment. Diffuse degenerative change.  IMPRESSION: Intraoperative spine with a metallic markers noted at the L2-L3 and L1-L2 disc levels as described above. Prior L4-5 posterior fusion.   Electronically Signed   By: Marcello Moores  Register   On: 08/12/2014 13:15   Dg C-arm 1-60 Min  08/12/2014   CLINICAL DATA:  Lumbar laminectomy  EXAM: LUMBAR SPINE - 2-3 VIEW; DG C-ARM 61-120 MIN  COMPARISON:  Previous intraoperative films.  FINDINGS: Two spot films were obtained intraoperatively. 1 seconds of fluoroscopy was utilized. The images show evidence of prior fixation at L4-5 stable in appearance. Surgical instrument is noted within the spinal canal at the L2-3 level. Correlation with the operative findings is recommended.   Electronically Signed   By: Inez Catalina M.D.   On: 08/12/2014 13:31    Disposition: 01-Home or Self Care  DISCHARGE INSTRUCTIONS: Avoid bending, stooping and twisting. Walk in house for first week them may start to get out slowly increasing distance up to one mile by 3 weeks post op. Keep incision dry for 3 days, may use tegaderm or similar water impervious dressing.  for DVT prophylaxis.   Follow-up Information   Follow up with Marybelle Killings, MD. Schedule an appointment as soon as possible for a visit in 2 weeks.   Specialty:  Orthopedic Surgery   Contact information:   [place] [address] [phone]        Signed: Epimenio Foot 09/05/2014, 11:13 AM

## 2015-02-15 DIAGNOSIS — M461 Sacroiliitis, not elsewhere classified: Secondary | ICD-10-CM | POA: Diagnosis not present

## 2015-02-15 DIAGNOSIS — M545 Low back pain: Secondary | ICD-10-CM | POA: Diagnosis not present

## 2015-02-16 DIAGNOSIS — N400 Enlarged prostate without lower urinary tract symptoms: Secondary | ICD-10-CM | POA: Diagnosis not present

## 2015-02-28 DIAGNOSIS — M7071 Other bursitis of hip, right hip: Secondary | ICD-10-CM | POA: Diagnosis not present

## 2015-03-29 DIAGNOSIS — M545 Low back pain: Secondary | ICD-10-CM | POA: Diagnosis not present

## 2015-04-04 ENCOUNTER — Other Ambulatory Visit: Payer: Self-pay | Admitting: Orthopaedic Surgery

## 2015-04-04 ENCOUNTER — Ambulatory Visit
Admission: RE | Admit: 2015-04-04 | Discharge: 2015-04-04 | Disposition: A | Discharge status: Home / Self Care | Payer: Medicare Other | Source: Ambulatory Visit | Attending: Orthopaedic Surgery | Admitting: Orthopaedic Surgery

## 2015-04-04 DIAGNOSIS — M5136 Other intervertebral disc degeneration, lumbar region: Secondary | ICD-10-CM | POA: Diagnosis not present

## 2015-04-04 DIAGNOSIS — M4316 Spondylolisthesis, lumbar region: Secondary | ICD-10-CM | POA: Diagnosis not present

## 2015-04-04 DIAGNOSIS — M5126 Other intervertebral disc displacement, lumbar region: Secondary | ICD-10-CM | POA: Diagnosis not present

## 2015-04-04 DIAGNOSIS — M545 Low back pain: Secondary | ICD-10-CM

## 2015-04-04 DIAGNOSIS — M9973 Connective tissue and disc stenosis of intervertebral foramina of lumbar region: Secondary | ICD-10-CM | POA: Diagnosis not present

## 2015-04-11 DIAGNOSIS — M545 Low back pain: Secondary | ICD-10-CM | POA: Diagnosis not present

## 2015-04-11 DIAGNOSIS — M7071 Other bursitis of hip, right hip: Secondary | ICD-10-CM | POA: Diagnosis not present

## 2015-04-11 DIAGNOSIS — M461 Sacroiliitis, not elsewhere classified: Secondary | ICD-10-CM | POA: Diagnosis not present

## 2015-04-24 DIAGNOSIS — M961 Postlaminectomy syndrome, not elsewhere classified: Secondary | ICD-10-CM | POA: Diagnosis not present

## 2015-04-24 DIAGNOSIS — M5416 Radiculopathy, lumbar region: Secondary | ICD-10-CM | POA: Diagnosis not present

## 2015-05-02 DIAGNOSIS — M545 Low back pain: Secondary | ICD-10-CM | POA: Diagnosis not present

## 2015-05-02 DIAGNOSIS — M79604 Pain in right leg: Secondary | ICD-10-CM | POA: Diagnosis not present

## 2015-05-02 DIAGNOSIS — M5416 Radiculopathy, lumbar region: Secondary | ICD-10-CM | POA: Diagnosis not present

## 2015-05-03 ENCOUNTER — Encounter: Payer: Self-pay | Admitting: Family Medicine

## 2015-05-03 ENCOUNTER — Ambulatory Visit (INDEPENDENT_AMBULATORY_CARE_PROVIDER_SITE_OTHER): Payer: Medicare Other | Admitting: Family Medicine

## 2015-05-03 VITALS — BP 116/67 | HR 69 | Ht 61.0 in | Wt 138.0 lb

## 2015-05-03 DIAGNOSIS — E785 Hyperlipidemia, unspecified: Secondary | ICD-10-CM | POA: Diagnosis not present

## 2015-05-03 DIAGNOSIS — H60393 Other infective otitis externa, bilateral: Secondary | ICD-10-CM | POA: Diagnosis not present

## 2015-05-03 DIAGNOSIS — Z0000 Encounter for general adult medical examination without abnormal findings: Secondary | ICD-10-CM

## 2015-05-03 MED ORDER — NEOMYCIN-POLYMYXIN-HC 1 % OT SOLN: OTIC | Status: AC

## 2015-05-03 NOTE — Progress Notes (Signed)
Subjective:    Jim Tucker is a 74 y.o. male who presents for Medicare Annual/Subsequent preventive examination.   Preventive Screening-Counseling & Management  Tobacco History  Smoking status  . Former Smoker  Smokeless tobacco  . Never Used    Comment: quit smoking in Mar 2015   Colonoscopy: 07/2013 with repeat in 2019 Prostate: Discussed screening risks/beneifts with patient today, just had normal PSA and DRE with his urologist which was normal last month per patient.  Influenza Vaccine: Out of season Pneumovax: UTD Td/Tdap: Td 2007 Zoster: He declines at today's visit   Problems Prior to Visit 1. Lumbar back pain, BPH  Current Problems (verified) Patient Active Problem List   Diagnosis Date Noted  . Herniated lumbar disc without myelopathy 08/13/2014  . HNP (herniated nucleus pulposus), lumbar 08/08/2014  . ROTATOR CUFF SYNDROME, LEFT 09/14/2010  . COLONIC POLYPS 03/28/2008  . DIVERTICULOSIS OF COLON 03/28/2008  . HYPRTRPHY PROSTATE BNG W/O URINARY OBST/LUTS 09/10/2007  . PTERYGIUM NOS 03/02/2007  . DYSPEPSIA 12/03/2006  . SEBORRHEIC KERATOSIS 11/18/2006  . HYPERLIPIDEMIA 11/13/2006  . DYSRHYTHMIA, CARDIAC NOS 11/13/2006    Medications Prior to Visit Current Outpatient Prescriptions on File Prior to Visit  Medication Sig Dispense Refill  . atorvastatin (LIPITOR) 40 MG tablet Take 1 tablet (40 mg total) by mouth daily. 90 tablet 1  . finasteride (PROSCAR) 5 MG tablet Take 5 mg by mouth daily.      No current facility-administered medications on file prior to visit.    Current Medications (verified) Current Outpatient Prescriptions  Medication Sig Dispense Refill  . atorvastatin (LIPITOR) 40 MG tablet Take 1 tablet (40 mg total) by mouth daily. 90 tablet 1  . finasteride (PROSCAR) 5 MG tablet Take 5 mg by mouth daily.     Marland Kitchen gabapentin (NEURONTIN) 100 MG capsule Take 100 mg by mouth 3 (three) times daily.     No current facility-administered  medications for this visit.     Allergies (verified) Other   PAST HISTORY  Family History Family History  Problem Relation Age of Onset  . Stomach cancer Brother   . Colon polyps Neg Hx   . Esophageal cancer Neg Hx   . Rectal cancer Neg Hx   . Pancreatic cancer Neg Hx     Social History History  Substance Use Topics  . Smoking status: Former Research scientist (life sciences)  . Smokeless tobacco: Never Used     Comment: quit smoking in Mar 2015  . Alcohol Use: Yes     Comment: wine occasionally    Are there smokers in your home (other than you)?  No  Risk Factors Current exercise habits: The patient does not participate in regular exercise at present.  Dietary issues discussed: DASH   Cardiac risk factors: advanced age (older than 46 for men, 43 for women) and dyslipidemia.  Depression Screen (Note: if answer to either of the following is "Yes", a more complete depression screening is indicated)   Q1: Over the past two weeks, have you felt down, depressed or hopeless? No  Q2: Over the past two weeks, have you felt little interest or pleasure in doing things? No  Have you lost interest or pleasure in daily life? No  Do you often feel hopeless? No  Do you cry easily over simple problems? No  Activities of Daily Living In your present state of health, do you have any difficulty performing the following activities?:  Driving? No Managing money?  No Feeding yourself? No Getting from bed to chair?  No Climbing a flight of stairs? No Preparing food and eating?: No Bathing or showering? No Getting dressed: No Getting to the toilet? No Using the toilet:No Moving around from place to place: No In the past year have you fallen or had a near fall?:No   Are you sexually active?  No  Do you have more than one partner?  No  Hearing Difficulties: No Do you often ask people to speak up or repeat themselves? No Do you experience ringing or noises in your ears? No Do you have difficulty  understanding soft or whispered voices? No   Do you feel that you have a problem with memory? No  Do you often misplace items? No  Do you feel safe at home?  No  Cognitive Testing  Alert? Yes  Normal Appearance?Yes  Oriented to person? Yes  Place? Yes   Time? Yes  Recall of three objects?  Yes  Can perform simple calculations? Yes  Displays appropriate judgment?Yes  Can read the correct time from a watch face?Yes   Advanced Directives have been discussed with the patient? Yes   List the Names of Other Physician/Practitioners you currently use: 1.  Bing Ree  Indicate any recent Medical Services you may have received from other than [facility] providers in the past year (date may be approximate).  Immunization History  Administered Date(s) Administered  . Influenza Split 10/14/2011  . Influenza Whole 09/10/2007, 08/25/2008, 09/14/2010  . Pneumococcal Conjugate-13 04/29/2014  . Pneumococcal Polysaccharide-23 11/13/2006  . Td 11/13/2006    Screening Tests Health Maintenance  Topic Date Due  . INFLUENZA VACCINE  07/10/2015  . TETANUS/TDAP  11/13/2016  . COLONOSCOPY  08/02/2018  . ZOSTAVAX  Addressed  . PNA vac Low Risk Adult  Completed    All answers were reviewed with the patient and necessary referrals were made:  Marcial Pacas, DO   05/03/2015   History reviewed: allergies, current medications, past family history, past medical history, past social history, past surgical history and problem list  Review of Systems Review of Systems - General ROS: negative for - chills, fever, night sweats, weight gain or weight loss Ophthalmic ROS: negative for - decreased vision Psychological ROS: negative for - anxiety or depression ENT ROS: negative for - hearing change, nasal congestion, tinnitus or allergies Hematological and Lymphatic ROS: negative for - bleeding problems, bruising or swollen lymph nodes Breast ROS: negative Respiratory ROS: no cough, shortness of breath, or  wheezing Cardiovascular ROS: no chest pain or dyspnea on exertion Gastrointestinal ROS: no abdominal pain, change in bowel habits, or black or bloody stools Genito-Urinary ROS: negative for - genital discharge, genital ulcers, incontinence or abnormal bleeding from genitals Musculoskeletal ROS: negative for - joint pain or muscle pain Neurological ROS: negative for - headaches or memory loss Dermatological ROS: negative for lumps, mole changes, rash and skin lesion changes   Objective:     Vision by Snellen chart: right eye:20/25, left eye:20/25 Blood pressure 116/67, pulse 69, height 5\' 1"  (1.549 m), weight 138 lb (62.596 kg). Body mass index is 26.09 kg/(m^2).  General: No Acute Distress HEENT: Atraumatic, normocephalic, conjunctivae normal without scleral icterus.  No nasal discharge, hearing grossly intact, TMs with good landmarks bilaterally with no middle ear abnormalities, posterior pharynx clear without oral lesions. Neck: Supple, trachea midline, no cervical nor supraclavicular adenopathy. Pulmonary: Clear to auscultation bilaterally without wheezing, rhonchi, nor rales. Cardiac: Regular rate and rhythm.  No murmurs, rubs, nor gallops. No peripheral edema.  2+ peripheral pulses bilaterally.  Abdomen: Bowel sounds normal.  No masses.  Non-tender without rebound.  Negative Murphy's sign. MSK: Grossly intact, no signs of weakness.  Full strength throughout upper and lower extremities.  Full ROM in upper and lower extremities.  No midline spinal tenderness. Neuro: Gait unremarkable, CN II-XII grossly intact.  C5-C6 Reflex 2/4 Bilaterally, L4 Reflex 2/4 Bilaterally.  Cerebellar function intact. Skin: No rashes. Psych: Alert and oriented to person/place/time.  Thought process normal. No anxiety/depression.     Assessment:     Due for zoster but declined, up-to-date with preventative services otherwise     Plan:     During the course of the visit the patient was educated and  counseled about appropriate screening and preventive services including:     Diet review for nutrition referral? Not Indicated ____   Patient Instructions (the written plan) was given to the patient.  Medicare Attestation I have personally reviewed: The patient's medical and social history Their use of alcohol, tobacco or illicit drugs Their current medications and supplements The patient's functional ability including ADLs,fall risks, home safety risks, cognitive, and hearing and visual impairment Diet and physical activities Evidence for depression or mood disorders  The patient's weight, height, BMI, and visual acuity have been recorded in the chart.  I have made referrals, counseling, and provided education to the patient based on review of the above and I have provided the patient with a written personalized care plan for preventive services.     Marcial Pacas, DO   05/03/2015

## 2015-05-04 DIAGNOSIS — E785 Hyperlipidemia, unspecified: Secondary | ICD-10-CM | POA: Diagnosis not present

## 2015-05-04 DIAGNOSIS — Z0000 Encounter for general adult medical examination without abnormal findings: Secondary | ICD-10-CM | POA: Diagnosis not present

## 2015-05-04 LAB — LIPID PANEL
Cholesterol: 297 mg/dL — ABNORMAL HIGH (ref 0–200)
HDL: 59 mg/dL (ref >=40)
LDL CALC: 212 mg/dL — AB (ref 0–99)
Total CHOL/HDL Ratio: 5 Ratio
Triglycerides: 129 mg/dL (ref <150)
VLDL: 26 mg/dL (ref 0–40)

## 2015-05-04 LAB — COMPLETE METABOLIC PANEL WITH GFR
ALBUMIN: 4.1 g/dL (ref 3.5–5.2)
ALK PHOS: 104 U/L (ref 39–117)
ALT: 31 U/L (ref 0–53)
AST: 25 U/L (ref 0–37)
BUN: 23 mg/dL (ref 6–23)
CALCIUM: 9.7 mg/dL (ref 8.4–10.5)
CO2: 26 mEq/L (ref 19–32)
CREATININE: 0.61 mg/dL (ref 0.50–1.35)
Chloride: 104 mEq/L (ref 96–112)
GFR, Est African American: >89 mL/min
GFR, Est Non African American: >89 mL/min
Glucose, Bld: 101 mg/dL — ABNORMAL HIGH (ref 70–99)
Potassium: 4.8 mEq/L (ref 3.5–5.3)
Sodium: 141 mEq/L (ref 135–145)
Total Bilirubin: 0.7 mg/dL (ref 0.2–1.2)
Total Protein: 6.7 g/dL (ref 6.0–8.3)

## 2015-05-04 LAB — CBC
HCT: 49 % (ref 39.0–52.0)
Hemoglobin: 16.3 g/dL (ref 13.0–17.0)
MCH: 29.7 pg (ref 26.0–34.0)
MCHC: 33.3 g/dL (ref 30.0–36.0)
MCV: 89.3 fL (ref 78.0–100.0)
MPV: 10.6 fL (ref 8.6–12.4)
Platelets: 210 10*3/uL (ref 150–400)
RBC: 5.49 MIL/uL (ref 4.22–5.81)
RDW: 15 % (ref 11.5–15.5)
WBC: 6.2 10*3/uL (ref 4.0–10.5)

## 2015-05-05 ENCOUNTER — Telehealth: Payer: Self-pay | Admitting: Family Medicine

## 2015-05-05 MED ORDER — ROSUVASTATIN CALCIUM 20 MG PO TABS: 20.0000 mg | ORAL_TABLET | Freq: Every day | ORAL | Status: DC

## 2015-05-05 NOTE — Telephone Encounter (Signed)
Jim Tucker, Will you please let patient know that his cholesterol has significantly increased above the normal limit compared to last year.  I would recommend that he stop taking atorvastatin and switch to rosuvastatin which I've sent to his [facility].  If he actually ran out of atorvastatin recently I'd still recommend this switch.  Blood sugar was slightly elevated but still stable compared to prior results and not in the diabetic range.  I'd recommend returning in three months for follow up.

## 2015-05-05 NOTE — Telephone Encounter (Signed)
Pt.notified

## 2015-06-02 ENCOUNTER — Telehealth: Payer: Self-pay | Admitting: *Deleted

## 2015-06-02 NOTE — Telephone Encounter (Signed)
Pt left vm stating that the new statin you switched him to is over $100 and that he thinks his cholesterol went up because he stopped taking the atorvastatin for about 3 months.  He stated that he wants to go back on the atorvastatin.  Please advise.

## 2015-06-05 MED ORDER — ATORVASTATIN CALCIUM 40 MG PO TABS: 40.0000 mg | ORAL_TABLET | Freq: Every day | ORAL | Status: DC

## 2015-06-05 NOTE — Telephone Encounter (Signed)
lvm informing pt that new rx was sent and that he will need to f/u with regards to his cholestoerol.Jim Tucker Vesta

## 2015-06-05 NOTE — Telephone Encounter (Signed)
Jim Tucker, Rx sent to [facility], I would recommend he return in 3 months to recheck cholesterol levels.

## 2015-07-20 DIAGNOSIS — M25512 Pain in left shoulder: Secondary | ICD-10-CM | POA: Diagnosis not present

## 2015-08-09 ENCOUNTER — Ambulatory Visit (INDEPENDENT_AMBULATORY_CARE_PROVIDER_SITE_OTHER): Payer: Medicare Other | Admitting: Family Medicine

## 2015-08-09 ENCOUNTER — Encounter: Payer: Self-pay | Admitting: Family Medicine

## 2015-08-09 VITALS — BP 132/77 | HR 83 | Ht 60.0 in | Wt 139.0 lb

## 2015-08-09 DIAGNOSIS — E785 Hyperlipidemia, unspecified: Secondary | ICD-10-CM

## 2015-08-09 DIAGNOSIS — M5126 Other intervertebral disc displacement, lumbar region: Secondary | ICD-10-CM | POA: Diagnosis not present

## 2015-08-09 LAB — LIPID PANEL
CHOL/HDL RATIO: 2.6 ratio (ref <=5.0)
Cholesterol: 173 mg/dL (ref 125–200)
HDL: 66 mg/dL (ref >=40)
LDL Cholesterol: 84 mg/dL (ref <130)
Triglycerides: 115 mg/dL (ref <150)
VLDL: 23 mg/dL (ref <30)

## 2015-08-09 MED ORDER — MELOXICAM 15 MG PO TABS
15.0000 mg | ORAL_TABLET | Freq: Every day | ORAL | Status: DC
Start: 2015-08-09 — End: 2015-08-18

## 2015-08-09 NOTE — Progress Notes (Signed)
CC: Jim Tucker is a 74 y.o. male is here for Follow-up   Subjective: HPI:  Follow-up hyperlipidemia: He is taking 40 mg of atorvastatin on a daily basis. No right upper quadrant pain or myalgias. He stays active on a daily basis trying to stay busy with hobbies, he does not like to sit around and relax. He denies any chest pain or limb claudication.  Complains of persistent low back pain with radiation down the right leg. Symptoms are improved since his surgery last year however he still feels like there is room for improvement. Gabapentin has not helped much. He has not been taking anything inflammatory. He denies any motor or sensory disturbances other than pain.    Review Of Systems Outlined In HPI  Past Medical History  Diagnosis Date  . LBP (low back pain)     HNP  . Hyperlipidemia     takes Atorvastatin daily  . BPH (benign prostatic hypertrophy)     takes Proscar daily  . Arthritis   . History of colon polyps   . Internal hemorrhoid   . Diverticulosis   . Numbness and tingling     in right leg  . Nocturia     Past Surgical History  Procedure Laterality Date  . Prostate biopsy  2007, 2010    benign.  Urology - Dr. Ellwood Sayers  . Nasal sinus surgery  40 yrs ago  . Transurethral resection of prostate  10-23-2009  . Rotator cuff repair Bilateral     total of 3  . Lumbar spine surgery      x 2  . Cystoscopy with biopsy N/A 07/19/2013    Procedure: TRANSURETHRAL RESECTION OF BLADDER NECK;  Surgeon: Bernestine Amass, MD;  Location: [facility];  Service: Urology;  Laterality: N/A;  . Colonoscopy    . Lumbar laminectomy Right 08/12/2014    Procedure: Right L2-3 Hemilaminectomy, Removal HNP;  Surgeon: Marybelle Killings, MD;  Location: [place];  Service: Orthopedics;  Laterality: Right;   Family History  Problem Relation Age of Onset  . Stomach cancer Brother   . Colon polyps Neg Hx   . Esophageal cancer Neg Hx   . Rectal cancer Neg Hx   . Pancreatic cancer Neg  Hx     Social History   Social History  . Marital Status: Married    Spouse Name: Tretha Sciara  . Number of Children: 2   . Years of Education: N/A   Occupational History  . Retired Clinical biochemist.     Social History Main Topics  . Smoking status: Former Research scientist (life sciences)  . Smokeless tobacco: Never Used     Comment: quit smoking in Mar 2015  . Alcohol Use: Yes     Comment: wine occasionally  . Drug Use: No  . Sexual Activity: Not Currently     Comment: retired Clinical biochemist, married, 2 grown kids, walks 30 mis daily and lifts weights.   Other Topics Concern  . 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     Objective: BP 132/77 mmHg  Pulse 83  Ht 5' (1.524 m)  Wt 139 lb (63.05 kg)  BMI 27.15 kg/m2  General: Alert and Oriented, No Acute Distress HEENT: Pupils equal, round, reactive to light. Conjunctivae clear. Moist mucous membranes  Lungs: Clear to auscultation bilaterally, no wheezing/ronchi/rales.  Comfortable work of breathing. Good air movement. Cardiac: Regular rate and rhythm. Normal  S1/S2.  No murmurs, rubs, nor gallops.   Extremities: No peripheral edema.  Strong peripheral pulses.Full range of motion and strength in both lower extremities, normal gait.  Mental Status: No depression, anxiety, nor agitation. Skin: Warm and dry.  Assessment & Plan: Melville was seen today for follow-up.  Diagnoses and all orders for this visit:  Hyperlipidemia -     Lipid panel  Herniated lumbar disc without myelopathy  Other orders -     meloxicam (MOBIC) 15 MG tablet; Take 1 tablet (15 mg total) by mouth daily. To help with back pain.   If cholesterol is controlled he would like to have an 80 mg tablet of Lipitor that he would cut in half since he will be improved for half of the year and will not have access to this medication. Meloxicam provided for radicular pain. If beneficial please call prior to leaving  for [place] and I will provide him with a larger supply.  Return if symptoms worsen or fail to improve.

## 2015-08-10 ENCOUNTER — Telehealth: Payer: Self-pay | Admitting: Family Medicine

## 2015-08-10 MED ORDER — ATORVASTATIN CALCIUM 80 MG PO TABS: 40.0000 mg | ORAL_TABLET | Freq: Every day | ORAL | Status: DC

## 2015-08-10 NOTE — Telephone Encounter (Signed)
Seth Bake, Will you please let patient know that his cholesterol looks very well controlled.  I've sent in a new Rx of 80mg  tablets of atorvastatin that he can break in half to last him half a year each time he refills this medication.  If insurance is unwilling to fill 90 tablets please let me know.

## 2015-08-10 NOTE — Telephone Encounter (Signed)
Left message on vm with results  

## 2015-08-18 ENCOUNTER — Other Ambulatory Visit: Payer: Self-pay

## 2015-08-18 MED ORDER — MELOXICAM 15 MG PO TABS: 15.0000 mg | ORAL_TABLET | Freq: Every day | ORAL | Status: DC

## 2015-08-23 DIAGNOSIS — M5416 Radiculopathy, lumbar region: Secondary | ICD-10-CM | POA: Diagnosis not present

## 2015-08-23 DIAGNOSIS — M461 Sacroiliitis, not elsewhere classified: Secondary | ICD-10-CM | POA: Diagnosis not present

## 2015-08-23 DIAGNOSIS — M961 Postlaminectomy syndrome, not elsewhere classified: Secondary | ICD-10-CM | POA: Diagnosis not present

## 2015-09-15 ENCOUNTER — Other Ambulatory Visit: Payer: Self-pay | Admitting: Family Medicine

## 2015-09-15 DIAGNOSIS — Z23 Encounter for immunization: Secondary | ICD-10-CM | POA: Diagnosis not present

## 2015-09-15 NOTE — Telephone Encounter (Signed)
error 

## 2015-09-29 DIAGNOSIS — Z23 Encounter for immunization: Secondary | ICD-10-CM | POA: Diagnosis not present

## 2016-04-16 ENCOUNTER — Emergency Department (INDEPENDENT_AMBULATORY_CARE_PROVIDER_SITE_OTHER): Payer: Medicare Other

## 2016-04-16 ENCOUNTER — Emergency Department (INDEPENDENT_AMBULATORY_CARE_PROVIDER_SITE_OTHER)
Admission: EM | Admit: 2016-04-16 | Discharge: 2016-04-16 | Disposition: A | Discharge status: Home / Self Care | Payer: Medicare Other | Source: Home / Self Care | Attending: Emergency Medicine | Admitting: Emergency Medicine

## 2016-04-16 ENCOUNTER — Encounter: Payer: Self-pay | Admitting: *Deleted

## 2016-04-16 DIAGNOSIS — G9389 Other specified disorders of brain: Secondary | ICD-10-CM

## 2016-04-16 DIAGNOSIS — G44209 Tension-type headache, unspecified, not intractable: Secondary | ICD-10-CM | POA: Diagnosis not present

## 2016-04-16 DIAGNOSIS — G319 Degenerative disease of nervous system, unspecified: Secondary | ICD-10-CM

## 2016-04-16 DIAGNOSIS — I10 Essential (primary) hypertension: Secondary | ICD-10-CM | POA: Diagnosis not present

## 2016-04-16 MED ORDER — KETOROLAC TROMETHAMINE 60 MG/2ML IM SOLN
60.0000 mg | Freq: Once | INTRAMUSCULAR | Status: AC
  Administered 2016-04-16: 60 mg via INTRAMUSCULAR

## 2016-04-16 MED ORDER — HYDROCHLOROTHIAZIDE 25 MG PO TABS: 25.0000 mg | ORAL_TABLET | Freq: Every day | ORAL | Status: DC

## 2016-04-16 NOTE — ED Provider Notes (Signed)
CSN: [id number]     Arrival date & time 04/16/16  1354 History   First MD Initiated Contact with Patient 04/16/16 1407     Chief Complaint  Patient presents with  . Headache   (Consider location/radiation/quality/duration/timing/severity/associated sxs/prior Treatment) Patient is a 75 y.o. male presenting with headaches. The history is provided by the patient. No language interpreter was used.  Headache Pain location:  Generalized Quality:  Sharp Radiates to:  Does not radiate Severity currently:  8/10 Severity at highest:  8/10 Onset quality:  Gradual Duration:  1 day Timing:  Constant Progression:  Worsening Chronicity:  New Similar to prior headaches: yes   Context: emotional stress   Context: not activity, not coughing, not eating, not loud noise and not straining   Relieved by:  Nothing Worsened by:  Nothing Ineffective treatments:  Aspirin Associated symptoms: no abdominal pain, no eye pain, no facial pain, no neck pain, no sinus pressure and no visual change   Risk factors: no anger, no family hx of SAH, does not have insomnia and lifestyle not sedentary   Pt reports he has a headache since yesterday.  Pt reports no relief with aspirin.  Pt denies any previous headaches.    Past Medical History  Diagnosis Date  . LBP (low back pain)     HNP  . Hyperlipidemia     takes Atorvastatin daily  . BPH (benign prostatic hypertrophy)     takes Proscar daily  . Arthritis   . History of colon polyps   . Internal hemorrhoid   . Diverticulosis   . Numbness and tingling     in right leg  . Nocturia    Past Surgical History  Procedure Laterality Date  . Prostate biopsy  2007, 2010    benign.  Urology - Dr. Ellwood Sayers  . Nasal sinus surgery  40 yrs ago  . Transurethral resection of prostate  10-23-2009  . Rotator cuff repair Bilateral     total of 3  . Lumbar spine surgery      x 2  . Cystoscopy with biopsy N/A 07/19/2013    Procedure: TRANSURETHRAL RESECTION OF BLADDER  NECK;  Surgeon: Bernestine Amass, MD;  Location: [facility];  Service: Urology;  Laterality: N/A;  . Colonoscopy    . Lumbar laminectomy Right 08/12/2014    Procedure: Right L2-3 Hemilaminectomy, Removal HNP;  Surgeon: Marybelle Killings, MD;  Location: [place];  Service: Orthopedics;  Laterality: Right;   Family History  Problem Relation Age of Onset  . Stomach cancer Brother   . Colon polyps Neg Hx   . Esophageal cancer Neg Hx   . Rectal cancer Neg Hx   . Pancreatic cancer Neg Hx    Social History  Substance Use Topics  . Smoking status: Former Research scientist (life sciences)  . Smokeless tobacco: Never Used     Comment: quit smoking in Mar 2015  . Alcohol Use: Yes     Comment: wine occasionally    Review of Systems  HENT: Negative for sinus pressure.   Eyes: Negative for pain.  Gastrointestinal: Negative for abdominal pain.  Musculoskeletal: Negative for neck pain.  Neurological: Positive for headaches.  All other systems reviewed and are negative.   Allergies  Other  Home Medications   Prior to Admission medications   Medication Sig Start Date End Date Taking? Authorizing Provider  atorvastatin (LIPITOR) 80 MG tablet Take 0.5 tablets (40 mg total) by mouth daily. 08/10/15   Marcial Pacas, DO  finasteride (PROSCAR) 5 MG tablet Take 5 mg by mouth daily.     Historical Provider, MD  gabapentin (NEURONTIN) 100 MG capsule Take 100 mg by mouth 3 (three) times daily.    Historical Provider, MD  meloxicam (MOBIC) 15 MG tablet Take 1 tablet (15 mg total) by mouth daily. To help with back pain. 08/18/15   Marcial Pacas, DO   Meds Ordered and Administered this Visit  Medications - No data to display  BP 171/88 mmHg  Pulse 73  Temp(Src) 98 F (36.7 C) (Oral)  Resp 16  Ht 5\' 2"  (1.575 m)  Wt 136 lb (61.689 kg)  BMI 24.87 kg/m2  SpO2 95% No data found.   Physical Exam  Constitutional: He appears well-developed and well-nourished.  HENT:  Head: Normocephalic.  Right Ear: External ear normal.   Left Ear: External ear normal.  Nose: Nose normal.  Mouth/Throat: Oropharynx is clear and moist.  Eyes: Conjunctivae and EOM are normal. Pupils are equal, round, and reactive to light.  Neck: Normal range of motion. Neck supple.  Cardiovascular: Normal rate and normal heart sounds.   Pulmonary/Chest: Effort normal.  Abdominal: Soft.  Musculoskeletal: Normal range of motion.  Neurological: He is alert.  Skin: Skin is warm.  Psychiatric: He has a normal mood and affect.  Nursing note and vitals reviewed.   ED Course  Procedures (including critical care time)  Labs Review Labs Reviewed  COMPREHENSIVE METABOLIC PANEL - Abnormal; Notable for the following:    Creat 0.60 (*)    Total Protein 6.0 (*)    All other components within normal limits   Narrative:    Performed at:  [facility], [id number]                [place], [place] [address]  CBC WITH DIFFERENTIAL/PLATELET   Narrative:    Performed at:  [facility], [id number]                [place], [place] [address]  SEDIMENTATION RATE   Narrative:    Performed at:  [facility], [id number]                [place], [place] [address]    Imaging Review Ct Head Wo Contrast  04/16/2016  CLINICAL DATA:  Frontal headache starting yesterday EXAM: CT HEAD WITHOUT CONTRAST TECHNIQUE: Contiguous axial images were obtained from the base of the skull through the vertex without intravenous contrast. COMPARISON:  None. FINDINGS: Brain: No intracranial hemorrhage, mass effect or midline shift. Mild cerebral atrophy. Mild periventricular white matter decreased attenuation probable due to chronic small vessel ischemic changes. No acute cortical infarction. Small area of encephalomalacia in left frontal lobe high convexity axial image 21 probable due to prior infarct or prior trauma. No mass lesion is noted on this unenhanced scan. Vascular: No  hyperdense vessel or unexpected calcification. Skull: Negative for fracture or focal lesion. Sinuses/Orbits: There is mucosal thickening with partial opacification bilateral ethmoid air cells. Bilateral sphenoid sinuses unremarkable. Bilateral mastoid air cells are unremarkable. Other: None IMPRESSION: No acute intracranial abnormality. Mild cerebral atrophy. Small area of encephalomalacia in left frontal lobe high convexity axial image 21 probable  sequela from prior infarct or prior trauma. No definite acute cortical infarction. Electronically Signed   By: Lahoma Crocker M.D.   On: 04/16/2016 14:53     Visual Acuity Review  Right Eye Distance:   Left Eye Distance:   Bilateral Distance:    Right Eye Near:   Left Eye Near:    Bilateral Near:         MDM Head ct normal.  I gave pt torodol.  He reports complete relief of headache.  Pt reported headache was an 8.  He reports headache is a 0 after medication.  I will check sed rate and labs on pt.  Pt has appointment for Physical scheduled with Dr. Ileene Rubens next month.   Pt is advised if headache returns he needs to go to the Emergency department for further evaluation   1. Tension-type headache, not intractable, unspecified chronicity pattern   2. Essential hypertension, benign    An After Visit Summary was printed and given to the patient.    [place], PA-C 04/17/16 [phone]

## 2016-04-16 NOTE — Discharge Instructions (Signed)
DASH Eating Plan °DASH stands for "Dietary Approaches to Stop Hypertension." The DASH eating plan is a healthy eating plan that has been shown to reduce high blood pressure (hypertension). Additional health benefits may include reducing the risk of type 2 diabetes mellitus, heart disease, and stroke. The DASH eating plan may also help with weight loss. °WHAT DO I NEED TO KNOW ABOUT THE DASH EATING PLAN? °For the DASH eating plan, you will follow these general guidelines: °· Choose foods with a percent daily value for sodium of less than 5% (as listed on the food label). °· Use salt-free seasonings or herbs instead of table salt or sea salt. °· Check with your health care provider or pharmacist before using salt substitutes. °· Eat lower-sodium products, often labeled as "lower sodium" or "no salt added." °· Eat fresh foods. °· Eat more vegetables, fruits, and low-fat dairy products. °· Choose whole grains. Look for the word "whole" as the first word in the ingredient list. °· Choose fish and skinless chicken or turkey more often than red meat. Limit fish, poultry, and meat to 6 oz (170 g) each day. °· Limit sweets, desserts, sugars, and sugary drinks. °· Choose heart-healthy fats. °· Limit cheese to 1 oz (28 g) per day. °· Eat more home-cooked food and less restaurant, buffet, and fast food. °· Limit fried foods. °· Cook foods using methods other than frying. °· Limit canned vegetables. If you do use them, rinse them well to decrease the sodium. °· When eating at a restaurant, ask that your food be prepared with less salt, or no salt if possible. °WHAT FOODS CAN I EAT? °Seek help from a dietitian for individual calorie needs. °Grains °Whole grain or whole wheat bread. Brown rice. Whole grain or whole wheat pasta. Quinoa, bulgur, and whole grain cereals. Low-sodium cereals. Corn or whole wheat flour tortillas. Whole grain cornbread. Whole grain crackers. Low-sodium crackers. °Vegetables °Fresh or frozen vegetables  (raw, steamed, roasted, or grilled). Low-sodium or reduced-sodium tomato and vegetable juices. Low-sodium or reduced-sodium tomato sauce and paste. Low-sodium or reduced-sodium canned vegetables.  °Fruits °All fresh, canned (in natural juice), or frozen fruits. °Meat and Other Protein Products °Ground beef (85% or leaner), grass-fed beef, or beef trimmed of fat. Skinless chicken or turkey. Ground chicken or turkey. Pork trimmed of fat. All fish and seafood. Eggs. Dried beans, peas, or lentils. Unsalted nuts and seeds. Unsalted canned beans. °Dairy °Low-fat dairy products, such as skim or 1% milk, 2% or reduced-fat cheeses, low-fat ricotta or cottage cheese, or plain low-fat yogurt. Low-sodium or reduced-sodium cheeses. °Fats and Oils °Tub margarines without trans fats. Light or reduced-fat mayonnaise and salad dressings (reduced sodium). Avocado. Safflower, olive, or canola oils. Natural peanut or almond butter. °Other °Unsalted popcorn and pretzels. °The items listed above may not be a complete list of recommended foods or beverages. Contact your dietitian for more options. °WHAT FOODS ARE NOT RECOMMENDED? °Grains °White bread. White pasta. White rice. Refined cornbread. Bagels and croissants. Crackers that contain trans fat. °Vegetables °Creamed or fried vegetables. Vegetables in a cheese sauce. Regular canned vegetables. Regular canned tomato sauce and paste. Regular tomato and vegetable juices. °Fruits °Dried fruits. Canned fruit in light or heavy syrup. Fruit juice. °Meat and Other Protein Products °Fatty cuts of meat. Ribs, chicken wings, bacon, sausage, bologna, salami, chitterlings, fatback, hot dogs, bratwurst, and packaged luncheon meats. Salted nuts and seeds. Canned beans with salt. °Dairy °Whole or 2% milk, cream, half-and-half, and cream cheese. Whole-fat or sweetened yogurt. Full-fat   cheeses or blue cheese. Nondairy creamers and whipped toppings. Processed cheese, cheese spreads, or cheese  curds. Condiments Onion and garlic salt, seasoned salt, table salt, and sea salt. Canned and packaged gravies. Worcestershire sauce. Tartar sauce. Barbecue sauce. Teriyaki sauce. Soy sauce, including reduced sodium. Steak sauce. Fish sauce. Oyster sauce. Cocktail sauce. Horseradish. Ketchup and mustard. Meat flavorings and tenderizers. Bouillon cubes. Hot sauce. Tabasco sauce. Marinades. Taco seasonings. Relishes. Fats and Oils Butter, stick margarine, lard, shortening, ghee, and bacon fat. Coconut, palm kernel, or palm oils. Regular salad dressings. Other Pickles and olives. Salted popcorn and pretzels. The items listed above may not be a complete list of foods and beverages to avoid. Contact your dietitian for more information. WHERE CAN I FIND MORE INFORMATION? National Heart, Lung, and Blood Institute: travelstabloid.com   This information is not intended to replace advice given to you by your health care provider. Make sure you discuss any questions you have with your health care provider.   Document Released: 11/14/2011 Document Revised: 12/16/2014 Document Reviewed: 09/29/2013 Elsevier Interactive Patient Education 2016 [place] Headache Without Cause A headache is pain or discomfort felt around the head or neck area. There are many causes and types of headaches. In some cases, the cause may not be found.  HOME CARE  Managing Pain  Take over-the-counter and prescription medicines only as told by your doctor.  Lie down in a dark, quiet room when you have a headache.  If directed, apply ice to the head and neck area:  Put ice in a plastic bag.  Place a towel between your skin and the bag.  Leave the ice on for 20 minutes, 2-3 times per day.  Use a heating pad or hot shower to apply heat to the head and neck area as told by your doctor.  Keep lights dim if bright lights bother you or make your headaches worse. Eating and  Drinking  Eat meals on a regular schedule.  Lessen how much alcohol you drink.  Lessen how much caffeine you drink, or stop drinking caffeine. General Instructions  Keep all follow-up visits as told by your doctor. This is important.  Keep a journal to find out if certain things bring on headaches. For example, write down:  What you eat and drink.  How much sleep you get.  Any change to your diet or medicines.  Relax by getting a massage or doing other relaxing activities.  Lessen stress.  Sit up straight. Do not tighten (tense) your muscles.  Do not use tobacco products. This includes cigarettes, chewing tobacco, or e-cigarettes. If you need help quitting, ask your doctor.  Exercise regularly as told by your doctor.  Get enough sleep. This often means 7-9 hours of sleep. GET HELP IF:  Your symptoms are not helped by medicine.  You have a headache that feels different than the other headaches.  You feel sick to your stomach (nauseous) or you throw up (vomit).  You have a fever. GET HELP RIGHT AWAY IF:   Your headache becomes really bad.  You keep throwing up.  You have a stiff neck.  You have trouble seeing.  You have trouble speaking.  You have pain in the eye or ear.  Your muscles are weak or you lose muscle control.  You lose your balance or have trouble walking.  You feel like you will pass out (faint) or you pass out.  You have confusion.   This information is not intended to replace  advice given to you by your health care provider. Make sure you discuss any questions you have with your health care provider.   Document Released: 09/03/2008 Document Revised: 08/16/2015 Document Reviewed: 03/20/2015 Elsevier Interactive Patient Education 2016 [employer]. Hypertension Hypertension, commonly called high blood pressure, is when the force of blood pumping through your arteries is too strong. Your arteries are the blood vessels that carry blood from  your heart throughout your body. A blood pressure reading consists of a higher number over a lower number, such as 110/72. The higher number (systolic) is the pressure inside your arteries when your heart pumps. The lower number (diastolic) is the pressure inside your arteries when your heart relaxes. Ideally you want your blood pressure below 120/80. Hypertension forces your heart to work harder to pump blood. Your arteries may become narrow or stiff. Having untreated or uncontrolled hypertension can cause heart attack, stroke, kidney disease, and other problems. RISK FACTORS Some risk factors for high blood pressure are controllable. Others are not.  Risk factors you cannot control include:   Race. You may be at higher risk if you are African American.  Age. Risk increases with age.  Gender. Men are at higher risk than women before age 62 years. After age 82, women are at higher risk than men. Risk factors you can control include:  Not getting enough exercise or physical activity.  Being overweight.  Getting too much fat, sugar, calories, or salt in your diet.  Drinking too much alcohol. SIGNS AND SYMPTOMS Hypertension does not usually cause signs or symptoms. Extremely high blood pressure (hypertensive crisis) may cause headache, anxiety, shortness of breath, and nosebleed. DIAGNOSIS To check if you have hypertension, your health care provider will measure your blood pressure while you are seated, with your arm held at the level of your heart. It should be measured at least twice using the same arm. Certain conditions can cause a difference in blood pressure between your right and left arms. A blood pressure reading that is higher than normal on one occasion does not mean that you need treatment. If it is not clear whether you have high blood pressure, you may be asked to return on a different day to have your blood pressure checked again. Or, you may be asked to monitor your blood pressure  at home for 1 or more weeks. TREATMENT Treating high blood pressure includes making lifestyle changes and possibly taking medicine. Living a healthy lifestyle can help lower high blood pressure. You may need to change some of your habits. Lifestyle changes may include:  Following the DASH diet. This diet is high in fruits, vegetables, and whole grains. It is low in salt, red meat, and added sugars.  Keep your sodium intake below 2,300 mg per day.  Getting at least 30-45 minutes of aerobic exercise at least 4 times per week.  Losing weight if necessary.  Not smoking.  Limiting alcoholic beverages.  Learning ways to reduce stress. Your health care provider may prescribe medicine if lifestyle changes are not enough to get your blood pressure under control, and if one of the following is true:  You are 47-18 years of age and your systolic blood pressure is above 140.  You are 66 years of age or older, and your systolic blood pressure is above 150.  Your diastolic blood pressure is above 90.  You have diabetes, and your systolic blood pressure is over [id number] or your diastolic blood pressure is over 90.  You have  kidney disease and your blood pressure is above 140/90.  You have heart disease and your blood pressure is above 140/90. Your personal target blood pressure may vary depending on your medical conditions, your age, and other factors. HOME CARE INSTRUCTIONS  Have your blood pressure rechecked as directed by your health care provider.   Take medicines only as directed by your health care provider. Follow the directions carefully. Blood pressure medicines must be taken as prescribed. The medicine does not work as well when you skip doses. Skipping doses also puts you at risk for problems.  Do not smoke.   Monitor your blood pressure at home as directed by your health care provider. SEEK MEDICAL CARE IF:   You think you are having a reaction to medicines taken.  You have  recurrent headaches or feel dizzy.  You have swelling in your ankles.  You have trouble with your vision. SEEK IMMEDIATE MEDICAL CARE IF:  You develop a severe headache or confusion.  You have unusual weakness, numbness, or feel faint.  You have severe chest or abdominal pain.  You vomit repeatedly.  You have trouble breathing. MAKE SURE YOU:   Understand these instructions.  Will watch your condition.  Will get help right away if you are not doing well or get worse.   This information is not intended to replace advice given to you by your health care provider. Make sure you discuss any questions you have with your health care provider.   Document Released: 11/25/2005 Document Revised: 04/11/2015 Document Reviewed: 09/17/2013 Elsevier Interactive Patient Education [employer].

## 2016-04-16 NOTE — ED Notes (Signed)
Pt c/o HA x 1 day. He took ASA last night with minimal relief. Denies hx of migraine.

## 2016-04-17 ENCOUNTER — Telehealth: Payer: Self-pay | Admitting: *Deleted

## 2016-04-17 ENCOUNTER — Ambulatory Visit: Payer: Medicare Other | Admitting: Family Medicine

## 2016-04-17 LAB — COMPREHENSIVE METABOLIC PANEL
ALK PHOS: 97 U/L (ref 40–115)
ALT: 27 U/L (ref 9–46)
AST: 24 U/L (ref 10–35)
Albumin: 4 g/dL (ref 3.6–5.1)
BILIRUBIN TOTAL: 0.5 mg/dL (ref 0.2–1.2)
BUN: 20 mg/dL (ref 7–25)
CO2: 25 mmol/L (ref 20–31)
CREATININE: 0.6 mg/dL — AB (ref 0.70–1.18)
Calcium: 8.9 mg/dL (ref 8.6–10.3)
Chloride: 106 mmol/L (ref 98–110)
GLUCOSE: 97 mg/dL (ref 65–99)
POTASSIUM: 3.8 mmol/L (ref 3.5–5.3)
Sodium: 139 mmol/L (ref 135–146)
TOTAL PROTEIN: 6 g/dL — AB (ref 6.1–8.1)

## 2016-04-17 LAB — CBC WITH DIFFERENTIAL/PLATELET
BASOS ABS: 0 {cells}/uL (ref 0–200)
Basophils Relative: 0 %
EOS PCT: 2 %
Eosinophils Absolute: 152 cells/uL (ref 15–500)
HEMATOCRIT: 46.8 % (ref 38.5–50.0)
HEMOGLOBIN: 15.5 g/dL (ref 13.2–17.1)
LYMPHS ABS: 1824 {cells}/uL (ref 850–3900)
Lymphocytes Relative: 24 %
MCH: 30.3 pg (ref 27.0–33.0)
MCHC: 33.1 g/dL (ref 32.0–36.0)
MCV: 91.4 fL (ref 80.0–100.0)
MONO ABS: 608 {cells}/uL (ref 200–950)
MPV: 11.2 fL (ref 7.5–12.5)
Monocytes Relative: 8 %
Neutro Abs: 5016 cells/uL (ref 1500–7800)
Neutrophils Relative %: 66 %
PLATELETS: 216 10*3/uL (ref 140–400)
RBC: 5.12 MIL/uL (ref 4.20–5.80)
RDW: 14 % (ref 11.0–15.0)
WBC: 7.6 10*3/uL (ref 3.8–10.8)

## 2016-04-17 LAB — SEDIMENTATION RATE: SED RATE: 1 mm/h (ref 0–20)

## 2016-05-02 ENCOUNTER — Ambulatory Visit (INDEPENDENT_AMBULATORY_CARE_PROVIDER_SITE_OTHER): Payer: Medicare Other | Admitting: Family Medicine

## 2016-05-02 ENCOUNTER — Other Ambulatory Visit: Payer: Self-pay | Admitting: *Deleted

## 2016-05-02 ENCOUNTER — Encounter: Payer: Medicare Other | Admitting: Family Medicine

## 2016-05-02 ENCOUNTER — Encounter: Payer: Self-pay | Admitting: Family Medicine

## 2016-05-02 VITALS — BP 142/80 | HR 69 | Ht 60.0 in | Wt 132.0 lb

## 2016-05-02 DIAGNOSIS — Z23 Encounter for immunization: Secondary | ICD-10-CM

## 2016-05-02 DIAGNOSIS — E785 Hyperlipidemia, unspecified: Secondary | ICD-10-CM | POA: Diagnosis not present

## 2016-05-02 DIAGNOSIS — Z0000 Encounter for general adult medical examination without abnormal findings: Secondary | ICD-10-CM

## 2016-05-02 DIAGNOSIS — H9193 Unspecified hearing loss, bilateral: Secondary | ICD-10-CM

## 2016-05-02 DIAGNOSIS — I10 Essential (primary) hypertension: Secondary | ICD-10-CM

## 2016-05-02 LAB — LIPID PANEL
Cholesterol: 153 mg/dL (ref 125–200)
HDL: 70 mg/dL (ref >=40)
LDL CALC: 56 mg/dL (ref <130)
Total CHOL/HDL Ratio: 2.2 Ratio (ref <=5.0)
Triglycerides: 135 mg/dL (ref <150)
VLDL: 27 mg/dL (ref <30)

## 2016-05-02 MED ORDER — HYDROCHLOROTHIAZIDE 25 MG PO TABS: 25.0000 mg | ORAL_TABLET | Freq: Every day | ORAL | Status: DC

## 2016-05-02 MED ORDER — MELOXICAM 15 MG PO TABS: 15.0000 mg | ORAL_TABLET | Freq: Every day | ORAL | Status: DC

## 2016-05-02 MED ORDER — ATORVASTATIN CALCIUM 80 MG PO TABS: 40.0000 mg | ORAL_TABLET | Freq: Every day | ORAL | Status: DC

## 2016-05-02 NOTE — Addendum Note (Signed)
Addended by: Terance Hart on: 05/02/2016 09:35 AM   Modules accepted: Orders

## 2016-05-02 NOTE — Addendum Note (Signed)
Addended by: Huel Cote on: 05/02/2016 10:47 AM   Modules accepted: Orders

## 2016-05-02 NOTE — Progress Notes (Signed)
Subjective:    Jim Tucker is a 75 y.o. male who presents for Medicare Annual/Subsequent preventive examination.   Preventive Screening-Counseling & Management  Tobacco History  Smoking status  . Former Smoker  Smokeless tobacco  . Never Used    Comment: quit smoking in Mar 2015    Problems Prior to Visit 1. HTN  Colonoscopy: 07/2013 with repeat in 2019 Prostate: Discussed screening risks/beneifts with patient today, he would like to defer PSA and DRE to his urology appt in July  Influenza Vaccine: Out of season Pneumovax: UTD Td/Tdap: Td 2007 booster needed today Zoster: He declines at today's visit  He tells me he's never had an EKG and would like to have a baseline EKG today. He denies any chest pain   Current Problems (verified) Patient Active Problem List   Diagnosis Date Noted  . Herniated lumbar disc without myelopathy 08/13/2014  . HNP (herniated nucleus pulposus), lumbar 08/08/2014  . ROTATOR CUFF SYNDROME, LEFT 09/14/2010  . COLONIC POLYPS 03/28/2008  . DIVERTICULOSIS OF COLON 03/28/2008  . HYPRTRPHY PROSTATE BNG W/O URINARY OBST/LUTS 09/10/2007  . PTERYGIUM NOS 03/02/2007  . DYSPEPSIA 12/03/2006  . SEBORRHEIC KERATOSIS 11/18/2006  . Hyperlipidemia 11/13/2006  . DYSRHYTHMIA, CARDIAC NOS 11/13/2006    Medications Prior to Visit Current Outpatient Prescriptions on File Prior to Visit  Medication Sig Dispense Refill  . atorvastatin (LIPITOR) 80 MG tablet Take 0.5 tablets (40 mg total) by mouth daily. 90 tablet 1  . finasteride (PROSCAR) 5 MG tablet Take 5 mg by mouth daily.     Marland Kitchen gabapentin (NEURONTIN) 100 MG capsule Take 100 mg by mouth 3 (three) times daily.    . hydrochlorothiazide (HYDRODIURIL) 25 MG tablet Take 1 tablet (25 mg total) by mouth daily. 30 tablet 0  . meloxicam (MOBIC) 15 MG tablet Take 1 tablet (15 mg total) by mouth daily. To help with back pain. 30 tablet 6   No current facility-administered medications on file prior to visit.     Current Medications (verified) Current Outpatient Prescriptions  Medication Sig Dispense Refill  . atorvastatin (LIPITOR) 80 MG tablet Take 0.5 tablets (40 mg total) by mouth daily. 90 tablet 1  . finasteride (PROSCAR) 5 MG tablet Take 5 mg by mouth daily.     Marland Kitchen gabapentin (NEURONTIN) 100 MG capsule Take 100 mg by mouth 3 (three) times daily.    . hydrochlorothiazide (HYDRODIURIL) 25 MG tablet Take 1 tablet (25 mg total) by mouth daily. 30 tablet 0  . meloxicam (MOBIC) 15 MG tablet Take 1 tablet (15 mg total) by mouth daily. To help with back pain. 30 tablet 6   No current facility-administered medications for this visit.     Allergies (verified) Other   PAST HISTORY  Family History Family History  Problem Relation Age of Onset  . Stomach cancer Brother   . Colon polyps Neg Hx   . Esophageal cancer Neg Hx   . Rectal cancer Neg Hx   . Pancreatic cancer Neg Hx     Social History Social History  Substance Use Topics  . Smoking status: Former Research scientist (life sciences)  . Smokeless tobacco: Never Used     Comment: quit smoking in Mar 2015  . Alcohol Use: Yes     Comment: wine occasionally    Are there smokers in your home (other than you)?  No  Risk Factors Current exercise habits: Home exercise routine includes treadmill.  Dietary issues discussed:DASH  Cardiac risk factors: advanced age (older than 16 for men, 80  for women), dyslipidemia and hypertension.  Depression Screen (Note: if answer to either of the following is "Yes", a more complete depression screening is indicated)   Q1: Over the past two weeks, have you felt down, depressed or hopeless? No  Q2: Over the past two weeks, have you felt little interest or pleasure in doing things? No  Have you lost interest or pleasure in daily life? No  Do you often feel hopeless? No  Do you cry easily over simple problems? No  Activities of Daily Living In your present state of health, do you have any difficulty performing the  following activities?:  Driving? No Managing money?  No Feeding yourself? No Getting from bed to chair? No Climbing a flight of stairs? No Preparing food and eating?: No Bathing or showering? No Getting dressed: No Getting to the toilet? No Using the toilet:No Moving around from place to place: No In the past year have you fallen or had a near fall?:No   Are you sexually active?  No  Do you have more than one partner?  No  Hearing Difficulties: Yes Do you often ask people to speak up or repeat themselves? Yes Do you experience ringing or noises in your ears? Yes Do you have difficulty understanding soft or whispered voices? Yes   Do you feel that you have a problem with memory? No  Do you often misplace items? No  Do you feel safe at home?  No  Cognitive Testing  Alert? Yes  Normal Appearance?Yes  Oriented to person? Yes  Place? Yes   Time? Yes  Recall of three objects?  Yes  Can perform simple calculations? Yes  Displays appropriate judgment?Yes  Can read the correct time from a watch face?Yes   Advanced Directives have been discussed with the patient? Yes   List the Names of Other Physician/Practitioners you currently use: 1.  Dr. Wynema Birch any recent Medical Services you may have received from other than [facility] providers in the past year (date may be approximate).  Immunization History  Administered Date(s) Administered  . Influenza Split 10/14/2011  . Influenza Whole 09/10/2007, 08/25/2008, 09/14/2010  . Pneumococcal Conjugate-13 04/29/2014  . Pneumococcal Polysaccharide-23 11/13/2006  . Td 11/13/2006    Screening Tests Health Maintenance  Topic Date Due  . INFLUENZA VACCINE  08/09/2016 (Originally 07/09/2016)  . TETANUS/TDAP  11/13/2016  . COLONOSCOPY  08/02/2018  . ZOSTAVAX  Addressed  . PNA vac Low Risk Adult  Completed    All answers were reviewed with the patient and necessary referrals were made:  Laren Boom, DO   05/02/2016   History  reviewed: allergies, current medications, past family history, past medical history, past social history, past surgical history and problem list  Review of Systems Review of Systems - General ROS: negative for - chills, fever, night sweats, weight gain or weight loss Ophthalmic ROS: negative for - decreased vision Psychological ROS: negative for - anxiety or depression ENT ROS: negative for - hearing change, nasal congestion,  or allergies Hematological and Lymphatic ROS: negative for - bleeding problems, bruising or swollen lymph nodes Breast ROS: negative Respiratory ROS: no cough, shortness of breath, or wheezing Cardiovascular ROS: no chest pain or dyspnea on exertion Gastrointestinal ROS: no abdominal pain, change in bowel habits, or black or bloody stools Genito-Urinary ROS: negative for - genital discharge, genital ulcers, incontinence or abnormal bleeding from genitals Musculoskeletal ROS: negative for - joint pain or muscle pain Neurological ROS: negative for - headaches or memory  loss Dermatological ROS: negative for lumps, mole changes, rash and skin lesion changes   Objective:     Vision by Snellen chart: right eye:20/50, left eye:20/25 Blood pressure 142/80, pulse 69, height 5' (1.524 m), weight 132 lb (59.875 kg). Body mass index is 25.78 kg/(m^2).  General: No Acute Distress HEENT: Atraumatic, normocephalic, conjunctivae normal without scleral icterus.  No nasal discharge, hearing grossly intact, TMs with good landmarks bilaterally with no middle ear abnormalities, posterior pharynx clear without oral lesions. Neck: Supple, trachea midline, no cervical nor supraclavicular adenopathy. Pulmonary: Clear to auscultation bilaterally without wheezing, rhonchi, nor rales. Cardiac: Regular rate and rhythm.  No murmurs, rubs, nor gallops. No peripheral edema.  2+ peripheral pulses bilaterally. Abdomen: Bowel sounds normal.  No masses.  Non-tender without rebound.  Negative Murphy's  sign. MSK: Grossly intact, no signs of weakness.  Full strength throughout upper and lower extremities.  Full ROM in upper and lower extremities.  No midline spinal tenderness. Neuro: Gait unremarkable, CN II-XII grossly intact.  C5-C6 Reflex 2/4 Bilaterally, L4 Reflex 2/4 Bilaterally.  Cerebellar function intact. Skin: No rashes. Psych: Alert and oriented to person/place/time.  Thought process normal. No anxiety/depression.     Assessment:     Hearing loss requiring audiology referral, due for tetanus booster today. Due for lipid panel.      Plan:     During the course of the visit the patient was educated and counseled about appropriate screening and preventive services including:    Tdap and EKG   EKG shows normal sinus rhythm with questionable junctional rhythm, no pathologic Q waves or ST segment elevation or depression.  Diet review for nutrition referral?  Not Indicated ____   Patient Instructions (the written plan) was given to the patient.  Medicare Attestation I have personally reviewed: The patient's medical and social history Their use of alcohol, tobacco or illicit drugs Their current medications and supplements The patient's functional ability including ADLs,fall risks, home safety risks, cognitive, and hearing and visual impairment Diet and physical activities Evidence for depression or mood disorders  The patient's weight, height, BMI, and visual acuity have been recorded in the chart.  I have made referrals, counseling, and provided education to the patient based on review of the above and I have provided the patient with a written personalized care plan for preventive services.     Marcial Pacas, DO   05/02/2016

## 2016-05-02 NOTE — Patient Instructions (Signed)
An office that specializes in audiology, hearing, should be calling you within the next week. Please call me if nobody's cultured by next week about scheduling an appointment for this.

## 2016-06-10 DIAGNOSIS — C679 Malignant neoplasm of bladder, unspecified: Secondary | ICD-10-CM | POA: Diagnosis not present

## 2016-06-10 DIAGNOSIS — N411 Chronic prostatitis: Secondary | ICD-10-CM | POA: Diagnosis not present

## 2016-06-10 DIAGNOSIS — N400 Enlarged prostate without lower urinary tract symptoms: Secondary | ICD-10-CM | POA: Diagnosis not present

## 2016-07-15 ENCOUNTER — Encounter: Payer: Self-pay | Admitting: *Deleted

## 2016-07-15 ENCOUNTER — Emergency Department (INDEPENDENT_AMBULATORY_CARE_PROVIDER_SITE_OTHER): Payer: Medicare Other

## 2016-07-15 ENCOUNTER — Emergency Department (INDEPENDENT_AMBULATORY_CARE_PROVIDER_SITE_OTHER)
Admission: EM | Admit: 2016-07-15 | Discharge: 2016-07-15 | Disposition: A | Discharge status: Home / Self Care | Payer: Medicare Other | Source: Home / Self Care | Attending: Family Medicine | Admitting: Family Medicine

## 2016-07-15 DIAGNOSIS — S3992XA Unspecified injury of lower back, initial encounter: Secondary | ICD-10-CM | POA: Diagnosis not present

## 2016-07-15 DIAGNOSIS — M545 Low back pain: Secondary | ICD-10-CM | POA: Diagnosis not present

## 2016-07-15 DIAGNOSIS — M533 Sacrococcygeal disorders, not elsewhere classified: Secondary | ICD-10-CM

## 2016-07-15 DIAGNOSIS — S79912A Unspecified injury of left hip, initial encounter: Secondary | ICD-10-CM | POA: Diagnosis not present

## 2016-07-15 DIAGNOSIS — M791 Myalgia: Secondary | ICD-10-CM | POA: Diagnosis not present

## 2016-07-15 DIAGNOSIS — M25552 Pain in left hip: Secondary | ICD-10-CM | POA: Diagnosis not present

## 2016-07-15 DIAGNOSIS — W1809XA Striking against other object with subsequent fall, initial encounter: Secondary | ICD-10-CM

## 2016-07-15 DIAGNOSIS — M7918 Myalgia, other site: Secondary | ICD-10-CM

## 2016-07-15 NOTE — ED Provider Notes (Signed)
CSN: [id number]     Arrival date & time 07/15/16  1333 History   First MD Initiated Contact with Patient 07/15/16 1353     Chief Complaint  Patient presents with  . Back Pain   (Consider location/radiation/quality/duration/timing/severity/associated sxs/prior Treatment) HPI  Froilan Busto is a 75 y.o. male presenting to UC with c/o Left buttock pain after falling off counter while trying to change a light bulb. Pt reports hitting his Left buttock on the toilet, now feels soreness and a "bump" on his buttock.  Pain is mild to moderate in severity, worse with sitting and palpation.  He did take 2 Advil at home just PTA.  He reports hx of lumbar fusion surgery a few years ago but denies pain in his lumbar spine. Pain does not radiate down leg. Denies numbness or tingling in legs or groin. No change in bowel or bladder habits. Denies hitting his head in the fall, no other injuries.    Past Medical History:  Diagnosis Date  . Arthritis   . BPH (benign prostatic hypertrophy)    takes Proscar daily  . Diverticulosis   . History of colon polyps   . Hyperlipidemia    takes Atorvastatin daily  . Internal hemorrhoid   . LBP (low back pain)    HNP  . Nocturia   . Numbness and tingling    in right leg   Past Surgical History:  Procedure Laterality Date  . BACK SURGERY    . COLONOSCOPY    . CYSTOSCOPY WITH BIOPSY N/A 07/19/2013   Procedure: TRANSURETHRAL RESECTION OF BLADDER NECK;  Surgeon: Bernestine Amass, MD;  Location: [facility];  Service: Urology;  Laterality: N/A;  . LUMBAR LAMINECTOMY Right 08/12/2014   Procedure: Right L2-3 Hemilaminectomy, Removal HNP;  Surgeon: Marybelle Killings, MD;  Location: [facility];  Service: Orthopedics;  Laterality: Right;  . LUMBAR SPINE SURGERY     x 2  . NASAL SINUS SURGERY  40 yrs ago  . PROSTATE BIOPSY  2007, 2010   benign.  Urology - Dr. Ellwood Sayers  . ROTATOR CUFF REPAIR Bilateral    total of 3  . TRANSURETHRAL RESECTION OF PROSTATE  10-23-2009    Family History  Problem Relation Age of Onset  . Stomach cancer Brother   . Colon polyps Neg Hx   . Esophageal cancer Neg Hx   . Rectal cancer Neg Hx   . Pancreatic cancer Neg Hx    Social History  Substance Use Topics  . Smoking status: Former Research scientist (life sciences)  . Smokeless tobacco: Never Used     Comment: quit smoking in Mar 2015  . Alcohol use Yes     Comment: wine occasionally    Review of Systems  Musculoskeletal: Positive for arthralgias, back pain and myalgias. Negative for gait problem, joint swelling, neck pain and neck stiffness.  Skin: Negative for color change and wound.  Neurological: Negative for weakness and numbness.    Allergies  Other  Home Medications   Prior to Admission medications   Medication Sig Start Date End Date Taking? Authorizing Provider  atorvastatin (LIPITOR) 80 MG tablet Take 0.5 tablets (40 mg total) by mouth daily. 05/02/16   Sean Hommel, DO  finasteride (PROSCAR) 5 MG tablet Take 5 mg by mouth daily.     Historical Provider, MD  gabapentin (NEURONTIN) 100 MG capsule Take 100 mg by mouth 3 (three) times daily.    Historical Provider, MD  hydrochlorothiazide (HYDRODIURIL) 25 MG tablet Take 1  tablet (25 mg total) by mouth daily. 05/02/16   Marcial Pacas, DO  meloxicam (MOBIC) 15 MG tablet Take 1 tablet (15 mg total) by mouth daily. To help with back pain. 05/02/16   Marcial Pacas, DO   Meds Ordered and Administered this Visit  Medications - No data to display  BP 127/64 (BP Location: Left Arm)   Pulse 75   Wt 136 lb (61.7 kg)   SpO2 95%   BMI 26.56 kg/m  No data found.   Physical Exam  Constitutional: He is oriented to person, place, and time. He appears well-developed and well-nourished.  HENT:  Head: Normocephalic and atraumatic.  Eyes: EOM are normal.  Neck: Normal range of motion. Neck supple.  No midline bone tenderness, no crepitus or step-offs.   Cardiovascular: Normal rate.   Pulmonary/Chest: Effort normal.  Musculoskeletal: Normal  range of motion. He exhibits tenderness. He exhibits no edema or deformity.  No midline spinal tenderness. Tenderness to Left buttock. Full ROM Left leg c/w crepitus.  Mildly antalgic gait.  Neurological: He is alert and oriented to person, place, and time.  Skin: Skin is warm and dry. Capillary refill takes less than 2 seconds.  Psychiatric: He has a normal mood and affect. His behavior is normal.  Nursing note and vitals reviewed.   Urgent Care Course   Clinical Course    Procedures (including critical care time)  Labs Review Labs Reviewed - No data to display  Imaging Review Dg Sacrum/coccyx  Result Date: 07/15/2016 CLINICAL DATA:  Left buttock pain pain after a fall from a counter while trying to change of light bulb. Initial encounter. EXAM: SACRUM AND COCCYX - 2+ VIEW COMPARISON:  CT abdomen and pelvis 08/10/2013 FINDINGS: There is no evidence of fracture or other focal bone lesions. IMPRESSION: Negative exam. Electronically Signed   By: Inge Rise M.D.   On: 07/15/2016 14:18   Dg Hip Unilat W Or Wo Pelvis 2-3 Views Left  Result Date: 07/15/2016 CLINICAL DATA:  Left buttock pain after fall. EXAM: DG HIP (WITH OR WITHOUT PELVIS) 2-3V LEFT COMPARISON:  None. FINDINGS: There is no evidence of hip fracture or dislocation. There is no evidence of arthropathy or other focal bone abnormality. Status post hardware fixation of the lower lumbar spine. IMPRESSION: 1. No acute findings. 2. If there is high clinical suspicion for occult fracture or the patient refuses to weightbear, consider further evaluation with MRI. Although CT is expeditious, evidence is lacking regarding accuracy of CT over plain film radiography. Electronically Signed   By: Kerby Moors M.D.   On: 07/15/2016 14:21       MDM   1. Fall against object, initial encounter   2. Left buttock pain   3. Left hip pain    Pt c/o Left buttock pain, tenderness on exam to Left buttock. No crepitus to hip joint. Slight  antalgic gait but able to bear weight on Left Leg and buttock.  Plain films: negative for fracture or dislocation. Encouraged alternating ice and heat. May continue to take Advil. F/u with PCP in 1 week if not improving, sooner if worsening.  Patient and wife verbalized understanding and agreement with treatment plan.      Noland Fordyce, PA-C 07/15/16 1436

## 2016-07-15 NOTE — ED Triage Notes (Signed)
Pt reports falling today while @ home standing on the counter changing a light bulb. When he fell he hit the side of the toilet with has left hip and left lower back. H/o Lumbar surgeries x 2. Taken 2 Advil @ home.

## 2016-07-31 ENCOUNTER — Ambulatory Visit: Payer: Medicare Other | Attending: Audiology | Admitting: Audiology

## 2016-07-31 DIAGNOSIS — H9190 Unspecified hearing loss, unspecified ear: Secondary | ICD-10-CM | POA: Insufficient documentation

## 2016-07-31 NOTE — Progress Notes (Signed)
Patient ID: Jim Tucker, male   DOB: 28-Oct-1941, 75 y.o.   MRN: [id number]  [facility] arrived for appointment and said that he wanted hearing aids and "was told that he would get a prescription for hearing aids today".   I informed Curley that we would do the audiological evaluation but that we did not provide hearing aids.  He said that he wanted to leave and did not want to pay for this visit.  He asked if there were free places to get a hearing evaluation.  I told him that [employer] provided free hearing tests to their members and that some places that sold hearing aids also did. Mahamadou Foutz left and his appointment was cancelled.  Deborah L. Heide Spark, Au.D., CCC-A Doctor of Audiology 07/31/2016

## 2016-08-06 ENCOUNTER — Other Ambulatory Visit: Payer: Self-pay

## 2016-08-07 ENCOUNTER — Other Ambulatory Visit: Payer: Self-pay | Admitting: Family Medicine

## 2016-08-20 ENCOUNTER — Ambulatory Visit: Payer: Medicare Other | Admitting: Family Medicine

## 2016-08-20 ENCOUNTER — Other Ambulatory Visit: Payer: Self-pay | Admitting: Family Medicine

## 2016-08-21 ENCOUNTER — Ambulatory Visit (INDEPENDENT_AMBULATORY_CARE_PROVIDER_SITE_OTHER): Payer: Medicare Other | Admitting: Family Medicine

## 2016-08-21 VITALS — BP 140/88 | HR 67 | Wt 135.0 lb

## 2016-08-21 DIAGNOSIS — E785 Hyperlipidemia, unspecified: Secondary | ICD-10-CM | POA: Diagnosis not present

## 2016-08-21 DIAGNOSIS — M7062 Trochanteric bursitis, left hip: Secondary | ICD-10-CM

## 2016-08-21 MED ORDER — HYDROCHLOROTHIAZIDE 25 MG PO TABS: 25.0000 mg | ORAL_TABLET | Freq: Every day | ORAL | 1 refills | Status: DC

## 2016-08-21 MED ORDER — ATORVASTATIN CALCIUM 80 MG PO TABS: 40.0000 mg | ORAL_TABLET | Freq: Every day | ORAL | 1 refills | Status: DC

## 2016-08-21 MED ORDER — FINASTERIDE 5 MG PO TABS: 5.0000 mg | ORAL_TABLET | Freq: Every day | ORAL | 1 refills | Status: DC

## 2016-08-21 NOTE — Progress Notes (Signed)
Jim Tucker is a 75 y.o. male who presents to [facility] today for left lateral leg pain. Patient fell about 6 weeks ago landing on his left side. He notes persistent left lateral hip pain and left lateral low back pain. He denies any radiating pain weakness or numbness fevers or chills. Patient lateral hip is worse with activity and when he lays on his left side. He's tried some over-the-counter medicines for pain control which have helped some. No fevers or chills.  Additionally he notes primary care concerns including BPH, hypertension, and hyperlipidemia. He plans on traveling to [place] in about a month and staying there for about 5 months. He would like a refill of his medication sufficient to last his trip.   Past Medical History:  Diagnosis Date  . Arthritis   . BPH (benign prostatic hypertrophy)    takes Proscar daily  . Diverticulosis   . History of colon polyps   . Hyperlipidemia    takes Atorvastatin daily  . Internal hemorrhoid   . LBP (low back pain)    HNP  . Nocturia   . Numbness and tingling    in right leg   Past Surgical History:  Procedure Laterality Date  . BACK SURGERY    . COLONOSCOPY    . CYSTOSCOPY WITH BIOPSY N/A 07/19/2013   Procedure: TRANSURETHRAL RESECTION OF BLADDER NECK;  Surgeon: Bernestine Amass, MD;  Location: [facility];  Service: Urology;  Laterality: N/A;  . LUMBAR LAMINECTOMY Right 08/12/2014   Procedure: Right L2-3 Hemilaminectomy, Removal HNP;  Surgeon: Marybelle Killings, MD;  Location: [place];  Service: Orthopedics;  Laterality: Right;  . LUMBAR SPINE SURGERY     x 2  . NASAL SINUS SURGERY  40 yrs ago  . PROSTATE BIOPSY  2007, 2010   benign.  Urology - Dr. Ellwood Sayers  . ROTATOR CUFF REPAIR Bilateral    total of 3  . TRANSURETHRAL RESECTION OF PROSTATE  10-23-2009   Social History  Substance Use  Topics  . Smoking status: Former Research scientist (life sciences)  . Smokeless tobacco: Never Used     Comment: quit smoking in Mar 2015  . Alcohol use Yes     Comment: wine occasionally   family history includes Stomach cancer in his brother.  ROS as above:  Medications: Current Outpatient Prescriptions  Medication Sig Dispense Refill  . atorvastatin (LIPITOR) 80 MG tablet Take 0.5 tablets (40 mg total) by mouth daily. 90 tablet 1  . finasteride (PROSCAR) 5 MG tablet Take 1 tablet (5 mg total) by mouth daily. 90 tablet 1  . hydrochlorothiazide (HYDRODIURIL) 25 MG tablet Take 1 tablet (25 mg total) by mouth daily. 90 tablet 1  . meloxicam (MOBIC) 15 MG tablet Take 1 tablet (15 mg total) by mouth daily. To help with back pain. 30 tablet 6   No current facility-administered medications for this visit.    Allergies  Allergen Reactions  . Other     Peanuts but not sure of reaction Raw Apples swells throat     Exam:  BP 140/88   Pulse 67   Wt 135 lb (61.2 kg)   BMI 26.37 kg/m  Gen: Well NAD L-spine nontender to midline. Mildly tender to palpation left quadratus lumborum. Normal back motion. Left hip normal appearing normal motion. Tender to palpation greater trochanter. Hip abduction strength 4+/5. Normal gait. Lower extremity strength is intact throughout with the exception  of left abduction noted above.  X-ray pelvis dated 07/15/2016 reviewed  No results found for this or any previous visit (from the past 24 hour(s)). No results found.    Assessment and Plan: 75 y.o. male with trochanteric bursitis and quadratus lumborum strain. Plan for physical therapy. Recheck in about a month if not better.  Retention hyperlipidemia and BPH. Labs updated in May. Medications refilled. Return for well visit in about 6 months or so.   Orders Placed This Encounter  Procedures  . Ambulatory referral to Physical Therapy    Referral Priority:   Routine    Referral Type:   Physical Medicine    Referral  Reason:   Specialty Services Required    Requested Specialty:   Physical Therapy    Number of Visits Requested:   1    Discussed warning signs or symptoms. Please see discharge instructions. Patient expresses understanding.

## 2016-08-21 NOTE — Patient Instructions (Signed)
Thank you for coming in today. Attend PT for your hip.  Return in 3-4 weeks if not better.    Trochanteric Bursitis You have hip pain due to trochanteric bursitis. Bursitis means that the sack near the outside of the hip is filled with fluid and inflamed. This sack is made up of protective soft tissue. The pain from trochanteric bursitis can be severe and keep you from sleep. It can radiate to the buttocks or down the outside of the thigh to the knee. The pain is almost always worse when rising from the seated or lying position and with walking. Pain can improve after you take a few steps. It happens more often in people with hip joint and lumbar spine problems, such as arthritis or previous surgery. Very rarely the trochanteric bursa can become infected, and antibiotics and/or surgery may be needed. Treatment often includes an injection of local anesthetic mixed with cortisone medicine. This medicine is injected into the area where it is most tender over the hip. Repeat injections may be necessary if the response to treatment is slow. You can apply ice packs over the tender area for 30 minutes every 2 hours for the next few days. Anti-inflammatory and/or narcotic pain medicine may also be helpful. Limit your activity for the next few days if the pain continues. See your caregiver in 5-10 days if you are not greatly improved.  SEEK IMMEDIATE MEDICAL CARE IF:  You develop severe pain, fever, or increased redness.  You have pain that radiates below the knee. EXERCISES STRETCHING EXERCISES - Trochanteric Bursitis  These exercises may help you when beginning to rehabilitate your injury. Your symptoms may resolve with or without further involvement from your physician, physical therapist, or athletic trainer. While completing these exercises, remember:   Restoring tissue flexibility helps normal motion to return to the joints. This allows healthier, less painful movement and activity.  An effective  stretch should be held for at least 30 seconds.  A stretch should never be painful. You should only feel a gentle lengthening or release in the stretched tissue. STRETCH - Iliotibial Band  On the floor or bed, lie on your side so your injured leg is on top. Bend your knee and grab your ankle.  Slowly bring your knee back so that your thigh is in line with your trunk. Keep your heel at your buttocks and gently arch your back so your head, shoulders and hips line up.  Slowly lower your leg so that your knee approaches the floor/bed until you feel a gentle stretch on the outside of your thigh. If you do not feel a stretch and your knee will not fall farther, place the heel of your opposite foot on top of your knee and pull your thigh down farther.  Hold this stretch for __________ seconds.  Repeat __________ times. Complete this exercise __________ times per day. STRETCH - Hamstrings, Supine   Lie on your back. Loop a belt or towel over the ball of your foot as shown.  Straighten your knee and slowly pull on the belt to raise your injured leg. Do not allow the knee to bend. Keep your opposite leg flat on the floor.  Raise the leg until you feel a gentle stretch behind your knee or thigh. Hold this position for __________ seconds.  Repeat __________ times. Complete this stretch __________ times per day. STRETCH - Quadriceps, Prone   Lie on your stomach on a firm surface, such as a bed or padded floor.  Bend your knee and grasp your ankle. If you are unable to reach your ankle or pant leg, use a belt around your foot to lengthen your reach.  Gently pull your heel toward your buttocks. Your knee should not slide out to the side. You should feel a stretch in the front of your thigh and/or knee.  Hold this position for __________ seconds.  Repeat __________ times. Complete this stretch __________ times per day. STRETCHING - Hip Flexors, Lunge Half kneel with your knee on the floor and your  opposite knee bent and directly over your ankle.  Keep good posture with your head over your shoulders. Tighten your buttocks to point your tailbone downward; this will prevent your back from arching too much.  You should feel a gentle stretch in the front of your thigh and/or hip. If you do not feel any resistance, slightly slide your opposite foot forward and then slowly lunge forward so your knee once again lines up over your ankle. Be sure your tailbone remains pointed downward.  Hold this stretch for __________ seconds.  Repeat __________ times. Complete this stretch __________ times per day. STRETCH - Adductors, Lunge  While standing, spread your legs.  Lean away from your injured leg by bending your opposite knee. You may rest your hands on your thigh for balance.  You should feel a stretch in your inner thigh. Hold for __________ seconds.  Repeat __________ times. Complete this exercise __________ times per day.   This information is not intended to replace advice given to you by your health care provider. Make sure you discuss any questions you have with your health care provider.   Document Released: 01/02/2005 Document Revised: 04/11/2015 Document Reviewed: 03/09/2009 Elsevier Interactive Patient Education [employer].

## 2016-08-23 ENCOUNTER — Encounter: Payer: Self-pay | Admitting: Rehabilitative and Restorative Service Providers"

## 2016-08-23 ENCOUNTER — Ambulatory Visit (INDEPENDENT_AMBULATORY_CARE_PROVIDER_SITE_OTHER): Payer: Medicare Other | Admitting: Rehabilitative and Restorative Service Providers"

## 2016-08-23 DIAGNOSIS — M25552 Pain in left hip: Secondary | ICD-10-CM | POA: Diagnosis present

## 2016-08-23 DIAGNOSIS — R29898 Other symptoms and signs involving the musculoskeletal system: Secondary | ICD-10-CM | POA: Diagnosis not present

## 2016-08-23 NOTE — Therapy (Addendum)
[place] [address] [place], [place], [address] Phone: [phone]   Fax:  [phone]  Physical Therapy Evaluation  Patient Details  Name: Jim Tucker MRN: [id number] Date of Birth: July 01, 1941 Referring Provider: Dr Lynne Leader  Encounter Date: 08/23/2016      PT End of Session - 08/23/16 1439    Visit Number 1   Number of Visits 12   Date for PT Re-Evaluation 10/04/16   PT Start Time 7672   PT Stop Time 1449   PT Time Calculation (min) 45 min   Activity Tolerance Patient tolerated treatment well      Past Medical History:  Diagnosis Date  . Arthritis   . BPH (benign prostatic hypertrophy)    takes Proscar daily  . Diverticulosis   . History of colon polyps   . Hyperlipidemia    takes Atorvastatin daily  . Internal hemorrhoid   . LBP (low back pain)    HNP  . Nocturia   . Numbness and tingling    in right leg    Past Surgical History:  Procedure Laterality Date  . BACK SURGERY    . COLONOSCOPY    . CYSTOSCOPY WITH BIOPSY N/A 07/19/2013   Procedure: TRANSURETHRAL RESECTION OF BLADDER NECK;  Surgeon: Bernestine Amass, MD;  Location: [facility];  Service: Urology;  Laterality: N/A;  . LUMBAR LAMINECTOMY Right 08/12/2014   Procedure: Right L2-3 Hemilaminectomy, Removal HNP;  Surgeon: Marybelle Killings, MD;  Location: [facility];  Service: Orthopedics;  Laterality: Right;  . LUMBAR SPINE SURGERY     x 2  . NASAL SINUS SURGERY  40 yrs ago  . PROSTATE BIOPSY  2007, 2010   benign.  Urology - Dr. Ellwood Sayers  . ROTATOR CUFF REPAIR Bilateral    total of 3  . TRANSURETHRAL RESECTION OF PROSTATE  10-23-2009    There were no vitals filed for this visit.       Subjective Assessment - 08/23/16 1406    Subjective Fell about 1 month ago landing on the Lt hip and pain has continued since that time.    Pertinent History 3 lunmbar surgeries most recent 2-3 years ago    How long can you sit comfortably?  no pain    How long can you stand comfortably? no pain    How long can you walk comfortably? no pain    Patient Stated Goals get rid of pain    Currently in Pain? Yes   Pain Score 5    Pain Location Hip   Pain Orientation Left   Pain Descriptors / Indicators Aching   Pain Type Acute pain   Pain Radiating Towards Lt side and hip    Pain Onset More than a month ago   Pain Frequency Intermittent   Aggravating Factors  lying on Lt side or stomach    Pain Relieving Factors changing positions             [facility] PT Assessment - 08/23/16 0001      Assessment   Medical Diagnosis Lt hip bursitis    Referring Provider Dr Lynne Leader   Onset Date/Surgical Date 07/09/16     Balance Screen   Has the patient fallen in the past 6 months Yes   How many times? 1   Has the patient had a decrease in activity level because of a fear of falling?  No   Is the patient reluctant to leave their home  because of a fear of falling?  No     Prior Function   Level of Independence Independent   Vocation Retired   Scientist, clinical (histocompatibility and immunogenetics)    Leisure yard work walking 30 min daily      Observation/Other Assessments   Focus on Therapeutic Outcomes (FOTO)  --  patient refused FOTO     Sensation   Additional Comments WFL's per pt report      AROM   Overall AROM Comments WFL's bilat hips/knees/ankles - some tightness end range Lt hip flexion and extension. limited lumbar mobility at 50-70% throughout     Strength   Overall Strength Comments grossly WNL's bilat LE's      Flexibility   Hamstrings tight bilat ~ 60-65 deg    ITB tight Lt > Rt   Piriformis tight and painful Lt      Palpation   Palpation comment tender and tight Lt piriformis/hip abuctors                    [facility] Adult PT Treatment/Exercise - 08/23/16 0001      Posture/Postural Control   Posture Comments head forward; shoulders rounded; slightly flexed forward at hips; LE's in ER      Self-Care    Self-Care --  avoid sleeping w/ Lt hip/knee abd and flexed; crossing legs     Therapeutic Activites    Therapeutic Activities --  myofacial ball release work      Knee/Hip Exercises: Stretches   Piriformis Stretch 3 reps;30 seconds  also diagonal knee to opposite shoudler x 3 at 30 sec      Moist Heat Therapy   Number Minutes Moist Heat 15 Minutes   Moist Heat Location Lumbar Spine;Hip  Lt      Acupuncturist Location Lt piriformis area    Electrical Stimulation Action IFC   Electrical Stimulation Parameters to tolerance   Electrical Stimulation Goals Pain;Tone                PT Education - 08/23/16 1434    Education provided Yes   Education Details HEP    Person(s) Educated Patient   Methods Explanation;Demonstration;Tactile cues;Verbal cues;Handout   Comprehension Verbalized understanding;Returned demonstration;Verbal cues required;Tactile cues required             PT Long Term Goals - 08/23/16 1609      PT LONG TERM GOAL #1   Title Decrease muscular tightness to palpation Lt hip 10/04/16   Time 6   Period Weeks   Status New     PT LONG TERM GOAL #2   Title Improve tightness through the Lt hip 10/04/16   Time 6   Period Weeks   Status New     PT LONG TERM GOAL #3   Title Decrease pain with patient not awakening through the night due to pain 10/04/16   Time 6   Period Weeks   Status New     PT LONG TERM GOAL #4   Title Independent in HEP 10/04/16   Time 6   Period Weeks   Status New     PT LONG TERM GOAL #5   Title Improve G-code  to CI 10/04/16   Time 6   Period Weeks   Status New               Plan - 08/23/16 1440    Clinical Impression Statement Patient presents with Lt hip pain following a  fall ~ 1 month ago. He refused to complete FOTO and several intake questions. He has limited mobilty through the Lt hip with tightness through the Lt piriformis and hip abductors. He has tightness to  palpation through the Lt piriformis and glut med area into the Lt greater trochanter.    Rehab Potential Good   PT Frequency 2x / week   PT Duration 6 weeks   PT Treatment/Interventions Patient/family education;ADLs/Self Care Home Management;Cryotherapy;Electrical Stimulation;Iontophoresis '4mg'$ /ml Dexamethasone;Moist Heat;Ultrasound;Dry needling;Manual techniques;Therapeutic activities;Therapeutic exercise   PT Next Visit Plan deep tissue work through the Lt piriformis and hip musculature(patient not interested in TDN), stretching; strengthening    Consulted and Agree with Plan of Care Patient      Patient will benefit from skilled therapeutic intervention in order to improve the following deficits and impairments:  Postural dysfunction, Pain, Impaired flexibility, Decreased mobility, Increased fascial restricitons, Increased muscle spasms, Decreased activity tolerance  Visit Diagnosis: Pain in left hip - Plan: PT plan of care cert/re-cert  Other symptoms and signs involving the musculoskeletal system - Plan: PT plan of care cert/re-cert     Problem List Patient Active Problem List   Diagnosis Date Noted  . Trochanteric bursitis of left hip 08/21/2016  . HNP (herniated nucleus pulposus), lumbar 08/08/2014  . ROTATOR CUFF SYNDROME, LEFT 09/14/2010  . COLONIC POLYPS 03/28/2008  . DIVERTICULOSIS OF COLON 03/28/2008  . HYPRTRPHY PROSTATE BNG W/O URINARY OBST/LUTS 09/10/2007  . PTERYGIUM NOS 03/02/2007  . DYSPEPSIA 12/03/2006  . SEBORRHEIC KERATOSIS 11/18/2006  . Hyperlipidemia 11/13/2006  . DYSRHYTHMIA, CARDIAC NOS 11/13/2006    Garfield Coiner Nilda Simmer PT, MPH 08/23/2016, 4:21 PM  [facility] [place], [place], [address] Phone: [phone]   Fax:  [phone]  Name: Jim Tucker MRN: [id number] Date of Birth: 1941/01/03  PHYSICAL THERAPY DISCHARGE SUMMARY  Visits from Start of Care: Eval only   Current  functional level related to goals / functional outcomes: See evaluation    Remaining deficits: Unknown    Education / Equipment: HEP   Plan: Patient agrees to discharge.  Patient goals were not met. Patient is being discharged due to not returning since the last visit.  ?????     Jim Tucker P. Helene Kelp PT, MPH 11/11/17 10:53 AM

## 2016-08-23 NOTE — Patient Instructions (Signed)
  Self massage for Lt hip area using ~4 inch rubber ball    Piriformis Stretch  Start with right foot across the left leg and slice the pizza pie at different angles toward opposite shoulder  Lying on back, pull right knee toward opposite shoulder. Hold 30 seconds. Repeat 3 times. Do 2-3 sessions per day.   TENS UNIT: This is helpful for muscle pain and spasm.   Search and Purchase a TENS 7000 2nd edition at www.tenspros.com. It should be less than $30.     TENS unit instructions: Do not shower or bathe with the unit on Turn the unit off before removing electrodes or batteries If the electrodes lose stickiness add a drop of water to the electrodes after they are disconnected from the unit and place on plastic sheet. If you continued to have difficulty, call the TENS unit company to purchase more electrodes. Do not apply lotion on the skin area prior to use. Make sure the skin is clean and dry as this will help prolong the life of the electrodes. After use, always check skin for unusual red areas, rash or other skin difficulties. If there are any skin problems, does not apply electrodes to the same area. Never remove the electrodes from the unit by pulling the wires. Do not use the TENS unit or electrodes other than as directed. Do not change electrode placement without consultating your therapist or physician. Keep 2 fingers with between each electrode.

## 2016-09-02 DIAGNOSIS — Z23 Encounter for immunization: Secondary | ICD-10-CM | POA: Diagnosis not present

## 2016-10-24 ENCOUNTER — Encounter: Payer: Self-pay | Admitting: Family Medicine

## 2017-02-25 ENCOUNTER — Ambulatory Visit (INDEPENDENT_AMBULATORY_CARE_PROVIDER_SITE_OTHER): Payer: Medicare Other | Admitting: Family Medicine

## 2017-02-25 ENCOUNTER — Encounter: Payer: Self-pay | Admitting: Family Medicine

## 2017-02-25 VITALS — BP 126/68 | HR 64 | Temp 98.6°F | Wt 138.0 lb

## 2017-02-25 DIAGNOSIS — J0100 Acute maxillary sinusitis, unspecified: Secondary | ICD-10-CM

## 2017-02-25 DIAGNOSIS — I10 Essential (primary) hypertension: Secondary | ICD-10-CM | POA: Diagnosis not present

## 2017-02-25 MED ORDER — IPRATROPIUM BROMIDE 0.06 % NA SOLN: 2.0000 | NASAL | 6 refills | Status: DC | PRN

## 2017-02-25 MED ORDER — BENZONATATE 200 MG PO CAPS: 200.0000 mg | ORAL_CAPSULE | Freq: Three times a day (TID) | ORAL | 0 refills | Status: DC | PRN

## 2017-02-25 MED ORDER — HYDROCHLOROTHIAZIDE 25 MG PO TABS: 25.0000 mg | ORAL_TABLET | Freq: Every day | ORAL | 1 refills | Status: DC

## 2017-02-25 MED ORDER — AZITHROMYCIN 250 MG PO TABS: 250.0000 mg | ORAL_TABLET | Freq: Every day | ORAL | 0 refills | Status: DC

## 2017-02-25 NOTE — Patient Instructions (Signed)
Thank you for coming in today. Take tessalon pearles for cough Use atrovent nasal spray as needed.  Take azithromycin antibiotics if not better.  Call or go to the emergency room if you get worse, have trouble breathing, have chest pains, or palpitations.  Recheck in May for wellness exam.    Sinusitis, Adult Sinusitis is soreness and inflammation of your sinuses. Sinuses are hollow spaces in the bones around your face. They are located:  Around your eyes.  In the middle of your forehead.  Behind your nose.  In your cheekbones. Your sinuses and nasal passages are lined with a stringy fluid (mucus). Mucus normally drains out of your sinuses. When your nasal tissues get inflamed or swollen, the mucus can get trapped or blocked so air cannot flow through your sinuses. This lets bacteria, viruses, and funguses grow, and that leads to infection. Follow these instructions at home: Medicines   Take, use, or apply over-the-counter and prescription medicines only as told by your doctor. These may include nasal sprays.  If you were prescribed an antibiotic medicine, take it as told by your doctor. Do not stop taking the antibiotic even if you start to feel better. Hydrate and Humidify   Drink enough water to keep your pee (urine) clear or pale yellow.  Use a cool mist humidifier to keep the humidity level in your home above 50%.  Breathe in steam for 10-15 minutes, 3-4 times a day or as told by your doctor. You can do this in the bathroom while a hot shower is running.  Try not to spend time in cool or dry air. Rest   Rest as much as possible.  Sleep with your head raised (elevated).  Make sure to get enough sleep each night. General instructions   Put a warm, moist washcloth on your face 3-4 times a day or as told by your doctor. This will help with discomfort.  Wash your hands often with soap and water. If there is no soap and water, use hand sanitizer.  Do not smoke. Avoid  being around people who are smoking (secondhand smoke).  Keep all follow-up visits as told by your doctor. This is important. Contact a doctor if:  You have a fever.  Your symptoms get worse.  Your symptoms do not get better within 10 days. Get help right away if:  You have a very bad headache.  You cannot stop throwing up (vomiting).  You have pain or swelling around your face or eyes.  You have trouble seeing.  You feel confused.  Your neck is stiff.  You have trouble breathing. This information is not intended to replace advice given to you by your health care provider. Make sure you discuss any questions you have with your health care provider. Document Released: 05/13/2008 Document Revised: 07/21/2016 Document Reviewed: 09/20/2015 Elsevier Interactive Patient Education  2017 [employer].

## 2017-02-25 NOTE — Progress Notes (Signed)
Jim Tucker is a 76 y.o. male who presents to [facility] [place] today for cough and congestion. Patient notes a several day history of sore throat cough congestion and sinus pressure. He denies fevers shortness of breath vomiting or diarrhea. The cough is bothersome but not interfering with sleep. He has not tried any medications yet. He notes in the past has done well with an injection in the clinic.  Additionally he notes that his run out of his hydrochlorothiazide. He would like a refill if possible. He has a scheduled wellness visit in May.   Past Medical History:  Diagnosis Date  . Arthritis   . BPH (benign prostatic hypertrophy)    takes Proscar daily  . Diverticulosis   . History of colon polyps   . Hyperlipidemia    takes Atorvastatin daily  . Internal hemorrhoid   . LBP (low back pain)    HNP  . Nocturia   . Numbness and tingling    in right leg   Past Surgical History:  Procedure Laterality Date  . BACK SURGERY    . COLONOSCOPY    . CYSTOSCOPY WITH BIOPSY N/A 07/19/2013   Procedure: TRANSURETHRAL RESECTION OF BLADDER NECK;  Surgeon: Bernestine Amass, MD;  Location: [facility];  Service: Urology;  Laterality: N/A;  . LUMBAR LAMINECTOMY Right 08/12/2014   Procedure: Right L2-3 Hemilaminectomy, Removal HNP;  Surgeon: Marybelle Killings, MD;  Location: [facility];  Service: Orthopedics;  Laterality: Right;  . LUMBAR SPINE SURGERY     x 2  . NASAL SINUS SURGERY  40 yrs ago  . PROSTATE BIOPSY  2007, 2010   benign.  Urology - Dr. Ellwood Sayers  . ROTATOR CUFF REPAIR Bilateral    total of 3  . TRANSURETHRAL RESECTION OF PROSTATE  10-23-2009   Social History  Substance Use Topics  . Smoking status: Former Research scientist (life sciences)  . Smokeless tobacco: Never Used     Comment: quit smoking in Mar 2015  . Alcohol use Yes     Comment: wine occasionally   family history  includes Stomach cancer in his brother.  ROS as above:  Medications: Current Outpatient Prescriptions  Medication Sig Dispense Refill  . atorvastatin (LIPITOR) 80 MG tablet Take 0.5 tablets (40 mg total) by mouth daily. 90 tablet 1  . azithromycin (ZITHROMAX) 250 MG tablet Take 1 tablet (250 mg total) by mouth daily. Take first 2 tablets together, then 1 every day until finished. 6 tablet 0  . benzonatate (TESSALON) 200 MG capsule Take 1 capsule (200 mg total) by mouth 3 (three) times daily as needed for cough. 45 capsule 0  . finasteride (PROSCAR) 5 MG tablet Take 1 tablet (5 mg total) by mouth daily. 90 tablet 1  . hydrochlorothiazide (HYDRODIURIL) 25 MG tablet Take 1 tablet (25 mg total) by mouth daily. 90 tablet 1  . ipratropium (ATROVENT) 0.06 % nasal spray Place 2 sprays into both nostrils every 4 (four) hours as needed for rhinitis. 10 mL 6  . meloxicam (MOBIC) 15 MG tablet Take 1 tablet (15 mg total) by mouth daily. To help with back pain. 30 tablet 6   No current facility-administered medications for this visit.    Allergies  Allergen Reactions  . Other     Peanuts but not sure of reaction Raw Apples swells throat    Health Maintenance Health Maintenance  Topic Date Due  . INFLUENZA VACCINE  08/28/2017 (Originally  07/09/2016)  . COLONOSCOPY  08/02/2018  . TETANUS/TDAP  05/02/2026  . PNA vac Low Risk Adult  Completed     Exam:  BP 126/68   Pulse 64   Temp 98.6 F (37 C) (Oral)   Wt 138 lb (62.6 kg)   SpO2 96%   BMI 26.95 kg/m  Gen: Well NAD HEENT: EOMI,  MMM Posterior pharynx with cobblestoning normal tympanic membranes bilaterally. Tender palpation bilateral maxillary sinuses. Mild cervical lymphadenopathy is present bilaterally. Lungs: Normal work of breathing. CTABL Heart: RRR no MRG Abd: NABS, Soft. Nondistended, Nontender Exts: Brisk capillary refill, warm and well perfused.    No results found for this or any previous visit (from the past 72  hour(s)). No results found.    Assessment and Plan: 76 y.o. male with  Sinusitis likely viral. Treat with Atrovent nasal spray and Tessalon Perles. 40 mg of intramuscular Depo-Medrol given prior to discharge. Azithromycin views as a backup if symptoms do not improve.  Hypertension: Refill hydrochlorothiazide. We'll readdress this issue during the next wellness visit in a few months.   No orders of the defined types were placed in this encounter.  Meds ordered this encounter  Medications  . hydrochlorothiazide (HYDRODIURIL) 25 MG tablet    Sig: Take 1 tablet (25 mg total) by mouth daily.    Dispense:  90 tablet    Refill:  1  . ipratropium (ATROVENT) 0.06 % nasal spray    Sig: Place 2 sprays into both nostrils every 4 (four) hours as needed for rhinitis.    Dispense:  10 mL    Refill:  6  . benzonatate (TESSALON) 200 MG capsule    Sig: Take 1 capsule (200 mg total) by mouth 3 (three) times daily as needed for cough.    Dispense:  45 capsule    Refill:  0  . azithromycin (ZITHROMAX) 250 MG tablet    Sig: Take 1 tablet (250 mg total) by mouth daily. Take first 2 tablets together, then 1 every day until finished.    Dispense:  6 tablet    Refill:  0     Discussed warning signs or symptoms. Please see discharge instructions. Patient expresses understanding.

## 2017-05-02 ENCOUNTER — Encounter: Payer: Medicare Other | Admitting: Family Medicine

## 2017-05-07 ENCOUNTER — Encounter: Payer: Medicare Other | Admitting: Family Medicine

## 2017-05-07 ENCOUNTER — Ambulatory Visit (INDEPENDENT_AMBULATORY_CARE_PROVIDER_SITE_OTHER): Payer: Self-pay | Admitting: Orthopedic Surgery

## 2017-05-08 ENCOUNTER — Encounter: Payer: Self-pay | Admitting: Family Medicine

## 2017-05-08 ENCOUNTER — Ambulatory Visit (INDEPENDENT_AMBULATORY_CARE_PROVIDER_SITE_OTHER): Payer: Medicare Other | Admitting: Family Medicine

## 2017-05-08 VITALS — BP 141/74 | HR 73 | Ht 65.0 in | Wt 131.0 lb

## 2017-05-08 DIAGNOSIS — E782 Mixed hyperlipidemia: Secondary | ICD-10-CM | POA: Diagnosis not present

## 2017-05-08 DIAGNOSIS — I10 Essential (primary) hypertension: Secondary | ICD-10-CM | POA: Diagnosis not present

## 2017-05-08 DIAGNOSIS — R739 Hyperglycemia, unspecified: Secondary | ICD-10-CM | POA: Diagnosis not present

## 2017-05-08 DIAGNOSIS — N400 Enlarged prostate without lower urinary tract symptoms: Secondary | ICD-10-CM | POA: Diagnosis not present

## 2017-05-08 DIAGNOSIS — Z0000 Encounter for general adult medical examination without abnormal findings: Secondary | ICD-10-CM | POA: Diagnosis not present

## 2017-05-08 LAB — CBC
HCT: 51.6 % — ABNORMAL HIGH (ref 38.5–50.0)
Hemoglobin: 17.1 g/dL (ref 13.2–17.1)
MCH: 29.8 pg (ref 27.0–33.0)
MCHC: 33.1 g/dL (ref 32.0–36.0)
MCV: 89.9 fL (ref 80.0–100.0)
MPV: 10.8 fL (ref 7.5–12.5)
PLATELETS: 216 10*3/uL (ref 140–400)
RBC: 5.74 MIL/uL (ref 4.20–5.80)
RDW: 14.8 % (ref 11.0–15.0)
WBC: 5.9 10*3/uL (ref 3.8–10.8)

## 2017-05-08 LAB — COMPLETE METABOLIC PANEL WITH GFR
ALK PHOS: 96 U/L (ref 40–115)
ALT: 32 U/L (ref 9–46)
AST: 27 U/L (ref 10–35)
Albumin: 4.1 g/dL (ref 3.6–5.1)
BILIRUBIN TOTAL: 1.3 mg/dL — AB (ref 0.2–1.2)
BUN: 26 mg/dL — ABNORMAL HIGH (ref 7–25)
CO2: 23 mmol/L (ref 20–31)
Calcium: 9.5 mg/dL (ref 8.6–10.3)
Chloride: 99 mmol/L (ref 98–110)
Creat: 0.8 mg/dL (ref 0.70–1.18)
GFR, Est Non African American: 87 mL/min (ref >=60)
GLUCOSE: 107 mg/dL — AB (ref 65–99)
POTASSIUM: 3.6 mmol/L (ref 3.5–5.3)
SODIUM: 137 mmol/L (ref 135–146)
TOTAL PROTEIN: 6.8 g/dL (ref 6.1–8.1)

## 2017-05-08 LAB — LIPID PANEL W/REFLEX DIRECT LDL
CHOL/HDL RATIO: 3.9 ratio (ref <5.0)
Cholesterol: 233 mg/dL — ABNORMAL HIGH (ref <200)
HDL: 59 mg/dL (ref >40)
LDL-CHOLESTEROL: 151 mg/dL — AB
Non-HDL Cholesterol (Calc): 174 mg/dL — ABNORMAL HIGH (ref <130)
TRIGLYCERIDES: 112 mg/dL (ref <150)

## 2017-05-08 MED ORDER — ZOSTER VAC RECOMB ADJUVANTED 50 MCG/0.5ML IM SUSR: 0.5000 mL | Freq: Once | INTRAMUSCULAR | 1 refills | Status: AC

## 2017-05-08 MED ORDER — MELOXICAM 15 MG PO TABS: 15.0000 mg | ORAL_TABLET | Freq: Every day | ORAL | 3 refills | Status: DC

## 2017-05-08 MED ORDER — FINASTERIDE 5 MG PO TABS: 5.0000 mg | ORAL_TABLET | Freq: Every day | ORAL | 3 refills | Status: DC

## 2017-05-08 MED ORDER — ATORVASTATIN CALCIUM 80 MG PO TABS: 40.0000 mg | ORAL_TABLET | Freq: Every day | ORAL | 3 refills | Status: DC

## 2017-05-08 MED ORDER — HYDROCHLOROTHIAZIDE 25 MG PO TABS: 25.0000 mg | ORAL_TABLET | Freq: Every day | ORAL | 3 refills | Status: DC

## 2017-05-08 NOTE — Progress Notes (Signed)
HPI: Jim Tucker is a 76 y.o. male  who presents to [facility] today, 05/08/17,  for Medicare Annual Wellness Exam  Patient presents for annual physical/Medicare wellness exam. no complaints today.   Past medical, surgical, social and family history reviewed:  Patient Active Problem List   Diagnosis Date Noted  . HTN (hypertension) 02/25/2017  . Trochanteric bursitis of left hip 08/21/2016  . HNP (herniated nucleus pulposus), lumbar 08/08/2014  . ROTATOR CUFF SYNDROME, LEFT 09/14/2010  . COLONIC POLYPS 03/28/2008  . DIVERTICULOSIS OF COLON 03/28/2008  . BPH (benign prostatic hyperplasia) 09/10/2007  . PTERYGIUM NOS 03/02/2007  . DYSPEPSIA 12/03/2006  . SEBORRHEIC KERATOSIS 11/18/2006  . Hyperlipidemia 11/13/2006  . DYSRHYTHMIA, CARDIAC NOS 11/13/2006    Past Surgical History:  Procedure Laterality Date  . BACK SURGERY    . COLONOSCOPY    . CYSTOSCOPY WITH BIOPSY N/A 07/19/2013   Procedure: TRANSURETHRAL RESECTION OF BLADDER NECK;  Surgeon: Bernestine Amass, MD;  Location: [facility];  Service: Urology;  Laterality: N/A;  . LUMBAR LAMINECTOMY Right 08/12/2014   Procedure: Right L2-3 Hemilaminectomy, Removal HNP;  Surgeon: Marybelle Killings, MD;  Location: [place];  Service: Orthopedics;  Laterality: Right;  . LUMBAR SPINE SURGERY     x 2  . NASAL SINUS SURGERY  40 yrs ago  . PROSTATE BIOPSY  2007, 2010   benign.  Urology - Dr. Ellwood Sayers  . ROTATOR CUFF REPAIR Bilateral    total of 3  . TRANSURETHRAL RESECTION OF PROSTATE  10-23-2009    Social History   Social History  . Marital status: Married    Spouse name: Tretha Sciara  . Number of children: 2   . Years of education: N/A   Occupational History  . Retired Clinical biochemist.  Retired   Social History Main Topics  . Smoking status: Former Research scientist (life sciences)  . Smokeless tobacco: Never Used     Comment: quit smoking in Mar 2015  . Alcohol use Yes     Comment: wine occasionally  . Drug  use: No  . 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   Other Topics Concern  . 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    Family History  Problem Relation Age of Onset  . Stomach cancer Brother   . Colon polyps Neg Hx   . Esophageal cancer Neg Hx   . Rectal cancer Neg Hx   . Pancreatic cancer Neg Hx      Current medication list and allergy/intolerance information reviewed:    Outpatient Encounter Prescriptions as of 05/08/2017  Medication Sig  . atorvastatin (LIPITOR) 80 MG tablet Take 0.5 tablets (40 mg total) by mouth daily.  . finasteride (PROSCAR) 5 MG tablet Take 1 tablet (5 mg total) by mouth daily.  . hydrochlorothiazide (HYDRODIURIL) 25 MG tablet Take 1 tablet (25 mg total) by mouth daily.  . meloxicam (MOBIC) 15 MG tablet Take 1 tablet (15 mg total) by mouth daily. To help with back pain.  . [DISCONTINUED] atorvastatin (LIPITOR) 80 MG tablet Take 0.5 tablets (40 mg total) by mouth daily.  . [DISCONTINUED] azithromycin (ZITHROMAX) 250 MG tablet Take 1 tablet (250 mg total) by mouth daily. Take first 2 tablets together, then 1 every day until finished.  . [DISCONTINUED] benzonatate (TESSALON) 200 MG capsule  Take 1 capsule (200 mg total) by mouth 3 (three) times daily as needed for cough.  . [DISCONTINUED] finasteride (PROSCAR) 5 MG tablet Take 1 tablet (5 mg total) by mouth daily.  . [DISCONTINUED] hydrochlorothiazide (HYDRODIURIL) 25 MG tablet Take 1 tablet (25 mg total) by mouth daily.  . [DISCONTINUED] ipratropium (ATROVENT) 0.06 % nasal spray Place 2 sprays into both nostrils every 4 (four) hours as needed for rhinitis.  . [DISCONTINUED] meloxicam (MOBIC) 15 MG tablet Take 1 tablet (15 mg total) by mouth daily. To help with back pain.  Marland Kitchen Zoster Vac Recomb Adjuvanted [facility]) injection Inject 0.5 mLs into  the muscle once. Give again after 2 months. If administered in clinic please fax report to Dr Georgina Snell [phone]   No facility-administered encounter medications on file as of 05/08/2017.     Allergies  Allergen Reactions  . Other     Peanuts but not sure of reaction Raw Apples swells throat       Review of Systems: No headache, visual changes, nausea, vomiting, diarrhea, constipation, dizziness, abdominal pain, skin rash, fevers, chills, night sweats, weight loss, swollen lymph nodes, body aches, joint swelling, muscle aches, chest pain, shortness of breath, mood changes, visual or auditory hallucinations.     Medicare Wellness Questionnaire  Are there smokers in your home (other than you)? no  Depression screen [facility] 2/9 05/08/2017 05/08/2017 05/02/2016 04/29/2014  Decreased Interest 0 0 0 0  Down, Depressed, Hopeless 0 0 0 0  PHQ - 2 Score 0 0 0 0        Activities of Daily Living In your present state of health, do you have any difficulty performing the following activities?:  Driving? no Managing money?  no Feeding yourself? no Getting from bed to chair? no Climbing a flight of stairs? no Preparing food and eating?: no Bathing or showering? no Getting dressed: no Getting to the toilet? no Using the toilet: no Moving around from place to place: no In the past year have you fallen or had a near fall?: no  Hearing Difficulties:  Do you often ask people to speak up or repeat themselves? yes Do you experience ringing or noises in your ears? yes  Do you have difficulty understanding soft or whispered voices? yes  Memory Difficulties:  Do you feel that you have a problem with memory? no  Do you often misplace items? no  Do you feel safe at home?  yes  Sexual Health:   Are you sexually active?  Yes  Do you have more than one partner?  No   Risk Factors  Current exercise habits: Walking  Dietary issues discussed:yes  Cardiac risk factors: Yes   Exam:  BP (!)  141/74   Pulse 73   Ht 5\' 5"  (1.651 m)   Wt 131 lb (59.4 kg)   BMI 21.80 kg/m  Vision by Snellen chart: right eye:see nurse notes, left eye:see nurse notes  Constitutional: VS see above. General Appearance: alert, well-developed, well-nourished, NAD  Ears, Nose, Mouth, Throat: MMM  Neck: No masses, trachea midline.   Respiratory: Normal respiratory effort. no wheeze, no rhonchi, no rales  Cardiovascular:No lower extremity edema.   Musculoskeletal: Gait normal. No clubbing/cyanosis of digits.   Neurological: Normal balance/coordination. No tremor. Recalls 3 objects and able to read face of watch with correct time.   Skin: warm, dry, intact. No rash/ulcer.   Psychiatric: Normal judgment/insight. Normal mood and affect. Oriented x3.   Prostate: Normal size, no nodules non-tender  ASSESSMENT/PLAN:   Encounter for Medicare annual wellness exam  Essential hypertension - Plan: CBC, COMPLETE METABOLIC PANEL WITH GFR, Lipid Panel w/reflex Direct LDL, PSA, Hemoglobin A1c  Benign prostatic hyperplasia without lower urinary tract symptoms - Plan: CBC, COMPLETE METABOLIC PANEL WITH GFR, Lipid Panel w/reflex Direct LDL, PSA, Hemoglobin A1c  Mixed hyperlipidemia - Plan: CBC, COMPLETE METABOLIC PANEL WITH GFR, Lipid Panel w/reflex Direct LDL, PSA, Hemoglobin A1c  Hyperglycemia - Plan: CBC, COMPLETE METABOLIC PANEL WITH GFR, Lipid Panel w/reflex Direct LDL, PSA, Hemoglobin A1c  Health Maintenance Health Maintenance  Topic Date Due  . INFLUENZA VACCINE  08/28/2017 (Originally 07/09/2017)  . COLONOSCOPY  08/02/2018  . TETANUS/TDAP  05/02/2026  . PNA vac Low Risk Adult  Completed    Immunization History  Administered Date(s) Administered  . Influenza Split 10/14/2011  . Influenza Whole 09/10/2007, 08/25/2008, 09/14/2010  . Pneumococcal Conjugate-13 04/29/2014  . Pneumococcal Polysaccharide-23 11/13/2006  . Td 11/13/2006  . Tdap 05/02/2016     During the course of the visit  the patient was educated and counseled about appropriate screening and preventive services as noted above.   Patient Instructions (the written plan) was given to the patient.  Medicare Attestation I have personally reviewed: The patient's medical and social history Their use of alcohol, tobacco or illicit drugs Their current medications and supplements The patient's functional ability including ADLs,fall risks, home safety risks, cognitive, and hearing and visual impairment Diet and physical activities Evidence for depression or mood disorders  The patient's weight, height, BMI, and visual acuity have been recorded in the chart.  I have made referrals, counseling, and provided education to the patient based on review of the above and I have provided the patient with a written personalized care plan for preventive services.

## 2017-05-08 NOTE — Patient Instructions (Signed)
Thank you for coming in today. Return in 1 year or sooner if needed.  Get labs today.  Ask the pharmacist about the new shingles vaccine.  I recommend a flu shot this Fall.  Return as needed.   Continue to exercise.

## 2017-05-09 LAB — HEMOGLOBIN A1C
Hgb A1c MFr Bld: 6.4 % — ABNORMAL HIGH (ref <5.7)
Mean Plasma Glucose: 137 mg/dL

## 2017-05-09 LAB — PSA: PSA: 0.4 ng/mL (ref <=4.0)

## 2017-05-12 ENCOUNTER — Telehealth: Payer: Self-pay

## 2017-05-12 DIAGNOSIS — H9209 Otalgia, unspecified ear: Secondary | ICD-10-CM

## 2017-05-12 NOTE — Telephone Encounter (Signed)
Pt would like a referral to ENT for right ear issues.  Please advise.

## 2017-05-13 NOTE — Telephone Encounter (Signed)
Referral placed.

## 2017-05-14 ENCOUNTER — Encounter (INDEPENDENT_AMBULATORY_CARE_PROVIDER_SITE_OTHER): Payer: Self-pay | Admitting: Orthopedic Surgery

## 2017-05-14 ENCOUNTER — Ambulatory Visit (INDEPENDENT_AMBULATORY_CARE_PROVIDER_SITE_OTHER): Payer: Medicare Other | Admitting: Orthopedic Surgery

## 2017-05-14 ENCOUNTER — Ambulatory Visit (INDEPENDENT_AMBULATORY_CARE_PROVIDER_SITE_OTHER): Payer: Medicare Other

## 2017-05-14 VITALS — Ht 65.0 in | Wt 131.0 lb

## 2017-05-14 DIAGNOSIS — G8929 Other chronic pain: Secondary | ICD-10-CM

## 2017-05-14 DIAGNOSIS — M25512 Pain in left shoulder: Secondary | ICD-10-CM | POA: Diagnosis not present

## 2017-05-14 DIAGNOSIS — M25511 Pain in right shoulder: Secondary | ICD-10-CM | POA: Diagnosis not present

## 2017-05-14 MED ORDER — LIDOCAINE HCL 1 % IJ SOLN
5.0000 mL | INTRAMUSCULAR | Status: AC | PRN
  Administered 2017-05-14: 5 mL

## 2017-05-14 MED ORDER — METHYLPREDNISOLONE ACETATE 40 MG/ML IJ SUSP
40.0000 mg | INTRAMUSCULAR | Status: AC | PRN
  Administered 2017-05-14: 40 mg via INTRA_ARTICULAR

## 2017-05-14 MED ORDER — METHYLPREDNISOLONE ACETATE 40 MG/ML IJ SUSP
40.0000 mg | INTRAMUSCULAR | Status: AC | PRN
Start: 2017-05-14 — End: 2017-05-14
  Administered 2017-05-14: 40 mg via INTRA_ARTICULAR

## 2017-05-14 NOTE — Progress Notes (Signed)
Office Visit Note   Patient: Jim Tucker           Date of Birth: 02/07/1941           MRN: [id number] Visit Date: 05/14/2017              Requested by: Gregor Hams, MD [address] [place], [place] [address] PCP: Gregor Hams, MD  Chief Complaint  Patient presents with  . Left Shoulder - Pain  . Right Shoulder - Pain      HPI: Patient is a 76 year old gentleman who is status post previous rotator cuff reconstructions and most recently underwent left shoulder arthroscopy in 2011 and right shoulder arthroscopy in 2015 for debridement. Patient states he has full range of motion but has pain with internal rotation and pain with lifting. He states he has decreased strength in both shoulders. Right worse than left.  Assessment & Plan: Visit Diagnoses:  1. Chronic right shoulder pain   2. Chronic left shoulder pain     Plan: Patient wishes to proceed with a subacromial injection at this time. Both shoulders were injected without complications. Discussed that if his shoulder is not better with the injection I doubt that arthroscopic debridement would provide much relief and most likely he would require a reverse total shoulder arthroplasty for the right shoulder. Will have patient follow-up with Dr. Marlou Sa if he is  still symptomatic in 4 weeks.  Follow-Up Instructions: Return if symptoms worsen or fail to improve.   Ortho Exam  Patient is alert, oriented, no adenopathy, well-dressed, normal affect, normal respiratory effort. Examination patient has full abduction and flexion with both shoulders. He has decreased internal rotation worse on the right than the left. He has pain with Neer or Hawkins impingement test pain with drop arm test.  Imaging: Xr Shoulder Left  Result Date: 05/14/2017 Two-view radiographs of the left shoulder show superior migration of the humeral head within the glenoid. There is arthritic changes of the before meals joint there is  osteophytic bone spurs around the joint. Bone-on-bone contact glenohumeral joint.  Xr Shoulder Right  Result Date: 05/14/2017 2 view radiographs of the right shoulder show superior migration of the humeral head within the glenoid. There are arthritic changes of the before meals joint lung fields are clear there are 2 retained anchors from previous rotator cuff reconstruction. There is bony spurs with bone-on-bone contact of the glenohumeral joint.   Labs: Lab Results  Component Value Date   HGBA1C 6.4 (H) 05/08/2017   ESRSEDRATE 1 04/16/2016    Orders:  Orders Placed This Encounter  Procedures  . XR Shoulder Left  . XR Shoulder Right   No orders of the defined types were placed in this encounter.    Procedures: Large Joint Inj Date/Time: 05/14/2017 3:47 PM Performed by: Moses Odoherty V Authorized by: Newt Minion   Consent Given by:  Patient Site marked: the procedure site was marked   Timeout: prior to procedure the correct patient, procedure, and site was verified   Indications:  Pain and diagnostic evaluation Location:  Shoulder Site:  R subacromial bursa Prep: patient was prepped and draped in usual sterile fashion   Needle Size:  22 G Needle Length:  1.5 inches Approach:  Posterior Ultrasound Guidance: No   Fluoroscopic Guidance: No   Arthrogram: No   Medications:  5 mL lidocaine 1 %; 40 mg methylPREDNISolone acetate 40 MG/ML Aspiration Attempted: No   Patient tolerance:  Patient tolerated  the procedure well with no immediate complications Large Joint Inj Date/Time: 05/14/2017 3:47 PM Performed by: Gianella Chismar V Authorized by: Newt Minion   Consent Given by:  Patient Site marked: the procedure site was marked   Timeout: prior to procedure the correct patient, procedure, and site was verified   Indications:  Pain and diagnostic evaluation Location:  Shoulder Site:  L subacromial bursa Prep: patient was prepped and draped in usual sterile fashion   Needle  Size:  22 G Needle Length:  1.5 inches Approach:  Posterior Ultrasound Guidance: No   Fluoroscopic Guidance: No   Arthrogram: No   Medications:  5 mL lidocaine 1 %; 40 mg methylPREDNISolone acetate 40 MG/ML Aspiration Attempted: No   Patient tolerance:  Patient tolerated the procedure well with no immediate complications    Clinical Data: No additional findings.  ROS:  All other systems negative, except as noted in the HPI. Review of Systems  Objective: Vital Signs: Ht 5\' 5"  (1.651 m)   Wt 131 lb (59.4 kg)   BMI 21.80 kg/m   Specialty Comments:  No specialty comments available.  PMFS History: Patient Active Problem List   Diagnosis Date Noted  . HTN (hypertension) 02/25/2017  . Trochanteric bursitis of left hip 08/21/2016  . HNP (herniated nucleus pulposus), lumbar 08/08/2014  . ROTATOR CUFF SYNDROME, LEFT 09/14/2010  . COLONIC POLYPS 03/28/2008  . DIVERTICULOSIS OF COLON 03/28/2008  . BPH (benign prostatic hyperplasia) 09/10/2007  . PTERYGIUM NOS 03/02/2007  . DYSPEPSIA 12/03/2006  . SEBORRHEIC KERATOSIS 11/18/2006  . Hyperlipidemia 11/13/2006  . DYSRHYTHMIA, CARDIAC NOS 11/13/2006   Past Medical History:  Diagnosis Date  . Arthritis   . BPH (benign prostatic hypertrophy)    takes Proscar daily  . Diverticulosis   . History of colon polyps   . Hyperlipidemia    takes Atorvastatin daily  . Internal hemorrhoid   . LBP (low back pain)    HNP  . Nocturia   . Numbness and tingling    in right leg    Family History  Problem Relation Age of Onset  . Stomach cancer Brother   . Colon polyps Neg Hx   . Esophageal cancer Neg Hx   . Rectal cancer Neg Hx   . Pancreatic cancer Neg Hx     Past Surgical History:  Procedure Laterality Date  . BACK SURGERY    . COLONOSCOPY    . CYSTOSCOPY WITH BIOPSY N/A 07/19/2013   Procedure: TRANSURETHRAL RESECTION OF BLADDER NECK;  Surgeon: Bernestine Amass, MD;  Location: [facility];  Service: Urology;   Laterality: N/A;  . LUMBAR LAMINECTOMY Right 08/12/2014   Procedure: Right L2-3 Hemilaminectomy, Removal HNP;  Surgeon: Marybelle Killings, MD;  Location: [place];  Service: Orthopedics;  Laterality: Right;  . LUMBAR SPINE SURGERY     x 2  . NASAL SINUS SURGERY  40 yrs ago  . PROSTATE BIOPSY  2007, 2010   benign.  Urology - Dr. Ellwood Sayers  . ROTATOR CUFF REPAIR Bilateral    total of 3  . TRANSURETHRAL RESECTION OF PROSTATE  10-23-2009   Social History   Occupational History  . Retired Clinical biochemist.  Retired   Social History Main Topics  . Smoking status: Former Research scientist (life sciences)  . Smokeless tobacco: Never Used     Comment: quit smoking in Mar 2015  . Alcohol use Yes     Comment: wine occasionally  . Drug use: No  . 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

## 2018-04-13 DIAGNOSIS — N476 Balanoposthitis: Secondary | ICD-10-CM | POA: Diagnosis not present

## 2018-04-20 DIAGNOSIS — N471 Phimosis: Secondary | ICD-10-CM | POA: Diagnosis not present

## 2018-05-08 ENCOUNTER — Encounter: Payer: Medicare Other | Admitting: Family Medicine

## 2018-05-11 ENCOUNTER — Other Ambulatory Visit: Payer: Self-pay | Admitting: Family Medicine

## 2018-05-12 ENCOUNTER — Ambulatory Visit (INDEPENDENT_AMBULATORY_CARE_PROVIDER_SITE_OTHER): Payer: Medicare Other | Admitting: Family Medicine

## 2018-05-12 ENCOUNTER — Encounter: Payer: Self-pay | Admitting: Family Medicine

## 2018-05-12 VITALS — BP 153/79 | HR 58 | Wt 137.0 lb

## 2018-05-12 DIAGNOSIS — Z0000 Encounter for general adult medical examination without abnormal findings: Secondary | ICD-10-CM

## 2018-05-12 DIAGNOSIS — Z125 Encounter for screening for malignant neoplasm of prostate: Secondary | ICD-10-CM | POA: Diagnosis not present

## 2018-05-12 DIAGNOSIS — Z114 Encounter for screening for human immunodeficiency virus [HIV]: Secondary | ICD-10-CM | POA: Diagnosis not present

## 2018-05-12 DIAGNOSIS — Z113 Encounter for screening for infections with a predominantly sexual mode of transmission: Secondary | ICD-10-CM | POA: Diagnosis not present

## 2018-05-12 DIAGNOSIS — E782 Mixed hyperlipidemia: Secondary | ICD-10-CM

## 2018-05-12 DIAGNOSIS — R7303 Prediabetes: Secondary | ICD-10-CM | POA: Diagnosis not present

## 2018-05-12 DIAGNOSIS — I10 Essential (primary) hypertension: Secondary | ICD-10-CM | POA: Diagnosis not present

## 2018-05-12 DIAGNOSIS — L989 Disorder of the skin and subcutaneous tissue, unspecified: Secondary | ICD-10-CM

## 2018-05-12 MED ORDER — ATORVASTATIN CALCIUM 80 MG PO TABS: 40.0000 mg | ORAL_TABLET | Freq: Every day | ORAL | 3 refills | Status: DC

## 2018-05-12 MED ORDER — TERBINAFINE HCL 250 MG PO TABS: 250.0000 mg | ORAL_TABLET | Freq: Every day | ORAL | 0 refills | Status: AC

## 2018-05-12 MED ORDER — HYDROCHLOROTHIAZIDE 25 MG PO TABS: 25.0000 mg | ORAL_TABLET | Freq: Every day | ORAL | 3 refills | Status: DC

## 2018-05-12 MED ORDER — FINASTERIDE 5 MG PO TABS: 5.0000 mg | ORAL_TABLET | Freq: Every day | ORAL | 3 refills | Status: DC

## 2018-05-12 NOTE — Patient Instructions (Signed)
Thank you for coming in today. Start lamisil for toenail.  You should hear from the skin doctor.  Recheck with me in about 3 months.  Return sooner if needed.   Get labs today.

## 2018-05-12 NOTE — Progress Notes (Signed)
HPI: Jim Tucker is a 77 y.o. male  who presents to [facility] today, 05/12/18,  for Medicare Annual Wellness Exam  Patient presents for annual physical/Medicare wellness exam. Onychomycosis.  Bothersome toenail fungus left great toe.  Patient would like treatment with Lamisil possible as this has worked in the past.   Past medical, surgical, social and family history reviewed:  Patient Active Problem List   Diagnosis Date Noted  . HTN (hypertension) 02/25/2017  . Trochanteric bursitis of left hip 08/21/2016  . HNP (herniated nucleus pulposus), lumbar 08/08/2014  . ROTATOR CUFF SYNDROME, LEFT 09/14/2010  . COLONIC POLYPS 03/28/2008  . DIVERTICULOSIS OF COLON 03/28/2008  . BPH (benign prostatic hyperplasia) 09/10/2007  . PTERYGIUM NOS 03/02/2007  . DYSPEPSIA 12/03/2006  . SEBORRHEIC KERATOSIS 11/18/2006  . Hyperlipidemia 11/13/2006  . DYSRHYTHMIA, CARDIAC NOS 11/13/2006    Past Surgical History:  Procedure Laterality Date  . BACK SURGERY    . COLONOSCOPY    . CYSTOSCOPY WITH BIOPSY N/A 07/19/2013   Procedure: TRANSURETHRAL RESECTION OF BLADDER NECK;  Surgeon: Bernestine Amass, MD;  Location: [facility];  Service: Urology;  Laterality: N/A;  . LUMBAR LAMINECTOMY Right 08/12/2014   Procedure: Right L2-3 Hemilaminectomy, Removal HNP;  Surgeon: Marybelle Killings, MD;  Location: [facility];  Service: Orthopedics;  Laterality: Right;  . LUMBAR SPINE SURGERY     x 2  . NASAL SINUS SURGERY  40 yrs ago  . PROSTATE BIOPSY  2007, 2010   benign.  Urology - Dr. Ellwood Sayers  . ROTATOR CUFF REPAIR Bilateral    total of 3  . TRANSURETHRAL RESECTION OF PROSTATE  10-23-2009    Social History   Socioeconomic History  . Marital status: Married    Spouse name: Tretha Sciara  . Number of children: 2   . Years of education: Not on file  . Highest education level: Not on file  Occupational History  . Occupation: Retired Clinical biochemist.     Employer:  RETIRED  Social Needs  . Financial resource strain: Not on file  . Food insecurity:    Worry: Not on file    Inability: Not on file  . Transportation needs:    Medical: Not on file    Non-medical: Not on file  Tobacco Use  . Smoking status: Former Research scientist (life sciences)  . Smokeless tobacco: Never Used  . Tobacco comment: quit smoking in Mar 2015  Substance and Sexual Activity  . Alcohol use: Yes    Comment: wine occasionally  . Drug use: No  . 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  Lifestyle  . Physical activity:    Days per week: Not on file    Minutes per session: Not on file  . Stress: Not on file  Relationships  . Social connections:    Talks on phone: Not on file    Gets together: Not on file    Attends religious service: Not on file    Active member of club or organization: Not on file    Attends meetings of clubs or organizations: Not on file    Relationship status: Not on file  . Intimate partner violence:    Fear of current or ex partner: Not on file    Emotionally abused: Not on file    Physically abused: Not on file    Forced sexual activity: Not on file  Other Topics Concern  .  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    Family History  Problem Relation Age of Onset  . Stomach cancer Brother   . Colon polyps Neg Hx   . Esophageal cancer Neg Hx   . Rectal cancer Neg Hx   . Pancreatic cancer Neg Hx      Current medication list and allergy/intolerance information reviewed:    Outpatient Encounter Medications as of 05/12/2018  Medication Sig  . atorvastatin (LIPITOR) 80 MG tablet Take 0.5 tablets (40 mg total) by mouth daily.  . finasteride (PROSCAR) 5 MG tablet Take 1 tablet (5 mg total) by mouth daily. Must make appointment before any further refills  . hydrochlorothiazide (HYDRODIURIL) 25 MG tablet  Take 1 tablet (25 mg total) by mouth daily.  . meloxicam (MOBIC) 15 MG tablet Take 1 tablet (15 mg total) by mouth daily. To help with back pain.   No facility-administered encounter medications on file as of 05/12/2018.     Allergies  Allergen Reactions  . Other     Peanuts but not sure of reaction Raw Apples swells throat       Review of Systems: No headache, visual changes, nausea, vomiting, diarrhea, constipation, dizziness, abdominal pain, skin rash, fevers, chills, night sweats, weight loss, swollen lymph nodes, body aches, joint swelling, muscle aches, chest pain, shortness of breath, mood changes, visual or auditory hallucinations.     Medicare Wellness Questionnaire  Are there smokers in your home (other than you)? no  Depression screen [facility] 2/9 05/12/2018 05/08/2017 05/08/2017 05/02/2016 04/29/2014  Decreased Interest 0 0 0 0 0  Down, Depressed, Hopeless 0 0 0 0 0  PHQ - 2 Score 0 0 0 0 0        Activities of Daily Living In your present state of health, do you have any difficulty performing the following activities?:  Driving? no Managing money?  no Feeding yourself? no Getting from bed to chair? no Climbing a flight of stairs? no Preparing food and eating?: no Bathing or showering? no Getting dressed: no Getting to the toilet? no Using the toilet: no Moving around from place to place: no In the past year have you fallen or had a near fall?: no  Hearing Difficulties:  Do you often ask people to speak up or repeat themselves? yes Do you experience ringing or noises in your ears? yes  Do you have difficulty understanding soft or whispered voices? yes  Memory Difficulties:  Do you feel that you have a problem with memory? no  Do you often misplace items? no  Do you feel safe at home?  yes  Sexual Health:   Are you sexually active?  Yes  Do you have more than one partner?  No   Risk Factors  Current exercise habits: Walking and Gardening  Dietary issues  discussed:yes  Cardiac risk factors: hypertension   Exam:  BP (!) 153/79   Pulse (!) 58   Wt 137 lb (62.1 kg)   BMI 22.80 kg/m  Vision by Snellen chart: right eye:see nurse notes, left eye:see nurse notes  Constitutional: VS see above. General Appearance: alert, well-developed, well-nourished, NAD  Ears, Nose, Mouth, Throat: MMM  Neck: No masses, trachea midline.   Respiratory: Normal respiratory effort. no wheeze, no rhonchi, no rales  Cardiovascular:No lower extremity edema.   Musculoskeletal: Gait normal. No clubbing/cyanosis of digits.   Neurological: Normal balance/coordination. No tremor. Recalls 3  objects and able to read face of watch with correct time.   Skin: warm, dry, intact. Right face hyperpigmented skin lesion 1cm.  Onychomycosis left great toenail   psychiatric: Normal judgment/insight. Normal mood and affect. Oriented x3.        ASSESSMENT/PLAN:   Encounter for Medicare annual wellness exam Doing reasonably well.  Patient has a healthy lifestyle.  Plan to continue current regimen and recheck basic fasting labs.  Patient additionally has onychomycosis.  Check metabolic panel and start treatment with Lamisil.  Recheck 3 months.  Hyperpigmented skin lesion face.  Refer to dermatology this in my opinion would benefit from biopsy.   Health Maintenance Health Maintenance  Topic Date Due  . INFLUENZA VACCINE  07/09/2018  . COLONOSCOPY  08/02/2018  . TETANUS/TDAP  05/02/2026  . PNA vac Low Risk Adult  Completed    Immunization History  Administered Date(s) Administered  . Influenza Split 10/14/2011  . Influenza Whole 09/10/2007, 08/25/2008, 09/14/2010  . Pneumococcal Conjugate-13 04/29/2014  . Pneumococcal Polysaccharide-23 11/13/2006  . Td 11/13/2006  . Tdap 05/02/2016     During the course of the visit the patient was educated and counseled about appropriate screening and preventive services as noted above.   Patient Instructions (the  written plan) was given to the patient.  Medicare Attestation I have personally reviewed: The patient's medical and social history Their use of alcohol, tobacco or illicit drugs Their current medications and supplements The patient's functional ability including ADLs,fall risks, home safety risks, cognitive, and hearing and visual impairment Diet and physical activities Evidence for depression or mood disorders  The patient's weight, height, BMI, and visual acuity have been recorded in the chart.  I have made referrals, counseling, and provided education to the patient based on review of the above and I have provided the patient with a written personalized care plan for preventive services.

## 2018-05-13 ENCOUNTER — Other Ambulatory Visit: Payer: Self-pay | Admitting: Family Medicine

## 2018-05-13 LAB — CBC
HEMATOCRIT: 45.7 % (ref 38.5–50.0)
HEMOGLOBIN: 15.7 g/dL (ref 13.2–17.1)
MCH: 31.1 pg (ref 27.0–33.0)
MCHC: 34.4 g/dL (ref 32.0–36.0)
MCV: 90.5 fL (ref 80.0–100.0)
MPV: 11.5 fL (ref 7.5–12.5)
Platelets: 171 10*3/uL (ref 140–400)
RBC: 5.05 10*6/uL (ref 4.20–5.80)
RDW: 13.3 % (ref 11.0–15.0)
WBC: 5.6 10*3/uL (ref 3.8–10.8)

## 2018-05-13 LAB — COMPLETE METABOLIC PANEL WITH GFR
AG RATIO: 1.8 (calc) (ref 1.0–2.5)
ALT: 34 U/L (ref 9–46)
AST: 28 U/L (ref 10–35)
Albumin: 4.2 g/dL (ref 3.6–5.1)
Alkaline phosphatase (APISO): 112 U/L (ref 40–115)
BUN/Creatinine Ratio: 45 (calc) — ABNORMAL HIGH (ref 6–22)
BUN: 27 mg/dL — ABNORMAL HIGH (ref 7–25)
CALCIUM: 9.2 mg/dL (ref 8.6–10.3)
CO2: 24 mmol/L (ref 20–32)
Chloride: 106 mmol/L (ref 98–110)
Creat: 0.6 mg/dL — ABNORMAL LOW (ref 0.70–1.18)
GFR, EST AFRICAN AMERICAN: 112 mL/min/{1.73_m2} (ref >=60)
GFR, EST NON AFRICAN AMERICAN: 97 mL/min/{1.73_m2} (ref >=60)
GLOBULIN: 2.3 g/dL (ref 1.9–3.7)
Glucose, Bld: 95 mg/dL (ref 65–99)
POTASSIUM: 4 mmol/L (ref 3.5–5.3)
SODIUM: 138 mmol/L (ref 135–146)
TOTAL PROTEIN: 6.5 g/dL (ref 6.1–8.1)
Total Bilirubin: 0.8 mg/dL (ref 0.2–1.2)

## 2018-05-13 LAB — LIPID PANEL W/REFLEX DIRECT LDL
CHOL/HDL RATIO: 3.3 (calc) (ref <5.0)
Cholesterol: 192 mg/dL (ref <200)
HDL: 59 mg/dL (ref >40)
LDL Cholesterol (Calc): 113 mg/dL (calc) — ABNORMAL HIGH
NON-HDL CHOLESTEROL (CALC): 133 mg/dL — AB (ref <130)
TRIGLYCERIDES: 92 mg/dL (ref <150)

## 2018-05-13 LAB — HIV ANTIBODY (ROUTINE TESTING W REFLEX): HIV: NONREACTIVE

## 2018-05-13 LAB — PSA: PSA: 0.4 ng/mL (ref <=4.0)

## 2018-05-13 LAB — HEMOGLOBIN A1C
EAG (MMOL/L): 7.3 (calc)
HEMOGLOBIN A1C: 6.2 %{Hb} — AB (ref <5.7)
MEAN PLASMA GLUCOSE: 131 (calc)

## 2018-05-13 LAB — RPR: RPR: NONREACTIVE

## 2018-05-14 DIAGNOSIS — R3915 Urgency of urination: Secondary | ICD-10-CM | POA: Diagnosis not present

## 2018-05-14 DIAGNOSIS — N401 Enlarged prostate with lower urinary tract symptoms: Secondary | ICD-10-CM | POA: Diagnosis not present

## 2018-05-21 ENCOUNTER — Ambulatory Visit (INDEPENDENT_AMBULATORY_CARE_PROVIDER_SITE_OTHER): Payer: Medicare Other | Admitting: Orthopedic Surgery

## 2018-05-21 ENCOUNTER — Encounter (INDEPENDENT_AMBULATORY_CARE_PROVIDER_SITE_OTHER): Payer: Self-pay | Admitting: Orthopedic Surgery

## 2018-05-21 ENCOUNTER — Ambulatory Visit (INDEPENDENT_AMBULATORY_CARE_PROVIDER_SITE_OTHER): Payer: Medicare Other

## 2018-05-21 VITALS — Ht 65.0 in | Wt 137.0 lb

## 2018-05-21 DIAGNOSIS — M545 Low back pain, unspecified: Secondary | ICD-10-CM

## 2018-05-21 DIAGNOSIS — G8929 Other chronic pain: Secondary | ICD-10-CM

## 2018-05-21 MED ORDER — PREDNISONE 10 MG PO TABS: 20.0000 mg | ORAL_TABLET | Freq: Every day | ORAL | 0 refills | Status: DC

## 2018-05-21 NOTE — Progress Notes (Signed)
Office Visit Note   Patient: Jim Tucker           Date of Birth: 12/31/1940           MRN: [id number] Visit Date: 05/21/2018              Requested by: Gregor Hams, MD [address] [place], [place] [address] PCP: Gregor Hams, MD  Chief Complaint  Patient presents with  . Lower Back - Pain      HPI: Patient is a 77 year old gentleman who is status post lumbar spine fusion L4-5 as well as laminectomy at L2-3 on the right.  Patient complains of right posterior buttocks pain which was exacerbated by doing yard work.  Patient has difficulty sleeping pain with prolonged rest.  Complains of pain and stiffness with start up.  Assessment & Plan: Visit Diagnoses:  1. Acute right-sided low back pain without sciatica   2. Chronic right-sided low back pain without sciatica     Plan: We will start him on a low-dose prednisone 20 mg with breakfast and he will wean off this as he feels better.  If he is still symptomatic in 4 weeks we will need to get an MRI scan of the lumbar spine.  Follow-Up Instructions: Return if symptoms worsen or fail to improve.   Ortho Exam  Patient is alert, oriented, no adenopathy, well-dressed, normal affect, normal respiratory effort. Examination patient has stiffness with start up he has an antalgic gait.  There is no pain with range of motion of the hip knee or ankle on the right.  He has negative straight leg raise no focal motor weakness.  Radiographs shows advanced degenerative disc disease worse at L2-3 on the right where he is symptomatic.  Imaging: Xr Lumbar Spine 2-3 Views  Result Date: 05/21/2018 2 view radiographs of the lumbar spine shows previous fusion at L4-5.  Hardware is intact.  Patient has advanced degenerative changes at L3-4 L2-3 and L1 to.  Patient has large osteophytic bone spurs worse on the right at L2-3.  No images are attached to the encounter.  Labs: Lab Results  Component Value Date   HGBA1C 6.2 (H)  05/12/2018   HGBA1C 6.4 (H) 05/08/2017   ESRSEDRATE 1 04/16/2016     Lab Results  Component Value Date   ALBUMIN 4.1 05/08/2017   ALBUMIN 4.0 04/16/2016   ALBUMIN 4.1 05/03/2015    Body mass index is 22.8 kg/m.  Orders:  Orders Placed This Encounter  Procedures  . XR Lumbar Spine 2-3 Views   Meds ordered this encounter  Medications  . predniSONE (DELTASONE) 10 MG tablet    Sig: Take 2 tablets (20 mg total) by mouth daily with breakfast.    Dispense:  60 tablet    Refill:  0     Procedures: No procedures performed  Clinical Data: No additional findings.  ROS:  All other systems negative, except as noted in the HPI. Review of Systems  Objective: Vital Signs: Ht 5\' 5"  (1.651 m)   Wt 137 lb (62.1 kg)   BMI 22.80 kg/m   Specialty Comments:  No specialty comments available.  PMFS History: Patient Active Problem List   Diagnosis Date Noted  . Prediabetes 05/12/2018  . HTN (hypertension) 02/25/2017  . Trochanteric bursitis of left hip 08/21/2016  . HNP (herniated nucleus pulposus), lumbar 08/08/2014  . ROTATOR CUFF SYNDROME, LEFT 09/14/2010  . COLONIC POLYPS 03/28/2008  . DIVERTICULOSIS OF COLON 03/28/2008  .  BPH (benign prostatic hyperplasia) 09/10/2007  . PTERYGIUM NOS 03/02/2007  . DYSPEPSIA 12/03/2006  . SEBORRHEIC KERATOSIS 11/18/2006  . Hyperlipidemia 11/13/2006  . DYSRHYTHMIA, CARDIAC NOS 11/13/2006   Past Medical History:  Diagnosis Date  . Arthritis   . BPH (benign prostatic hypertrophy)    takes Proscar daily  . Diverticulosis   . History of colon polyps   . Hyperlipidemia    takes Atorvastatin daily  . Internal hemorrhoid   . LBP (low back pain)    HNP  . Nocturia   . Numbness and tingling    in right leg    Family History  Problem Relation Age of Onset  . Stomach cancer Brother   . Colon polyps Neg Hx   . Esophageal cancer Neg Hx   . Rectal cancer Neg Hx   . Pancreatic cancer Neg Hx     Past Surgical History:  Procedure  Laterality Date  . BACK SURGERY    . COLONOSCOPY    . CYSTOSCOPY WITH BIOPSY N/A 07/19/2013   Procedure: TRANSURETHRAL RESECTION OF BLADDER NECK;  Surgeon: Bernestine Amass, MD;  Location: [facility];  Service: Urology;  Laterality: N/A;  . LUMBAR LAMINECTOMY Right 08/12/2014   Procedure: Right L2-3 Hemilaminectomy, Removal HNP;  Surgeon: Marybelle Killings, MD;  Location: [facility];  Service: Orthopedics;  Laterality: Right;  . LUMBAR SPINE SURGERY     x 2  . NASAL SINUS SURGERY  40 yrs ago  . PROSTATE BIOPSY  2007, 2010   benign.  Urology - Dr. Ellwood Sayers  . ROTATOR CUFF REPAIR Bilateral    total of 3  . TRANSURETHRAL RESECTION OF PROSTATE  10-23-2009   Social History   Occupational History  . Occupation: Retired Clinical biochemist.     Employer: RETIRED  Tobacco Use  . Smoking status: Former Research scientist (life sciences)  . Smokeless tobacco: Never Used  . Tobacco comment: quit smoking in Mar 2015  Substance and Sexual Activity  . Alcohol use: Yes    Comment: wine occasionally  . Drug use: No  . 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

## 2018-06-01 ENCOUNTER — Encounter: Payer: Self-pay | Admitting: Gastroenterology

## 2018-06-24 ENCOUNTER — Encounter: Payer: Self-pay | Admitting: Gastroenterology

## 2018-07-14 ENCOUNTER — Ambulatory Visit (INDEPENDENT_AMBULATORY_CARE_PROVIDER_SITE_OTHER): Payer: Medicare Other | Admitting: Orthopedic Surgery

## 2018-07-14 ENCOUNTER — Encounter (INDEPENDENT_AMBULATORY_CARE_PROVIDER_SITE_OTHER): Payer: Self-pay | Admitting: Orthopedic Surgery

## 2018-07-14 VITALS — Ht 65.0 in | Wt 137.0 lb

## 2018-07-14 DIAGNOSIS — G8929 Other chronic pain: Secondary | ICD-10-CM

## 2018-07-14 DIAGNOSIS — M545 Low back pain: Secondary | ICD-10-CM

## 2018-07-14 MED ORDER — PREDNISONE 10 MG PO TABS: 20.0000 mg | ORAL_TABLET | Freq: Every day | ORAL | 0 refills | Status: DC

## 2018-07-14 NOTE — Progress Notes (Signed)
Office Visit Note   Patient: Jim Tucker           Date of Birth: Aug 03, 1941           MRN: [id number] Visit Date: 07/14/2018              Requested by: Gregor Hams, MD [address] [place], [place] [address] PCP: Gregor Hams, MD  Chief Complaint  Patient presents with  . Lower Back - Pain      HPI: Patient is a 77 year old gentleman who presents in follow-up of his chronic lower back pain.  Patient states he is weaned down to 5 mg of prednisone a day and he states that he is not doing strenuous activity his back does not bother him.  He states he currently only has pain when using the chainsaw.  He states that one time he had some radicular pain with cramping down to the right calf.  Assessment & Plan: Visit Diagnoses:  1. Chronic right-sided low back pain without sciatica     Plan: Patient was called in a refill prescription for the prednisone.  He will use this as needed.  Patient is leaving for [place] for 6 months and will follow-up as needed.  Follow-Up Instructions: Return if symptoms worsen or fail to improve.   Ortho Exam  Patient is alert, oriented, no adenopathy, well-dressed, normal affect, normal respiratory effort. Examination patient has a normal gait.  He can stand on his toes and his heels without problems.  He has negative straight leg raise bilaterally with no focal motor weakness in either lower extremity.  Imaging: No results found. No images are attached to the encounter.  Labs: Lab Results  Component Value Date   HGBA1C 6.2 (H) 05/12/2018   HGBA1C 6.4 (H) 05/08/2017   ESRSEDRATE 1 04/16/2016     Lab Results  Component Value Date   ALBUMIN 4.1 05/08/2017   ALBUMIN 4.0 04/16/2016   ALBUMIN 4.1 05/03/2015    Body mass index is 22.8 kg/m.  Orders:  No orders of the defined types were placed in this encounter.  Meds ordered this encounter  Medications  . predniSONE (DELTASONE) 10 MG tablet    Sig: Take 2 tablets  (20 mg total) by mouth daily with breakfast.    Dispense:  60 tablet    Refill:  0     Procedures: No procedures performed  Clinical Data: No additional findings.  ROS:  All other systems negative, except as noted in the HPI. Review of Systems  Objective: Vital Signs: Ht 5\' 5"  (1.651 m)   Wt 137 lb (62.1 kg)   BMI 22.80 kg/m   Specialty Comments:  No specialty comments available.  PMFS History: Patient Active Problem List   Diagnosis Date Noted  . Prediabetes 05/12/2018  . HTN (hypertension) 02/25/2017  . Trochanteric bursitis of left hip 08/21/2016  . HNP (herniated nucleus pulposus), lumbar 08/08/2014  . ROTATOR CUFF SYNDROME, LEFT 09/14/2010  . COLONIC POLYPS 03/28/2008  . DIVERTICULOSIS OF COLON 03/28/2008  . BPH (benign prostatic hyperplasia) 09/10/2007  . PTERYGIUM NOS 03/02/2007  . DYSPEPSIA 12/03/2006  . SEBORRHEIC KERATOSIS 11/18/2006  . Hyperlipidemia 11/13/2006  . DYSRHYTHMIA, CARDIAC NOS 11/13/2006   Past Medical History:  Diagnosis Date  . Arthritis   . BPH (benign prostatic hypertrophy)    takes Proscar daily  . Diverticulosis   . History of colon polyps   . Hyperlipidemia    takes Atorvastatin daily  . Internal  hemorrhoid   . LBP (low back pain)    HNP  . Nocturia   . Numbness and tingling    in right leg    Family History  Problem Relation Age of Onset  . Stomach cancer Brother   . Colon polyps Neg Hx   . Esophageal cancer Neg Hx   . Rectal cancer Neg Hx   . Pancreatic cancer Neg Hx     Past Surgical History:  Procedure Laterality Date  . BACK SURGERY    . COLONOSCOPY    . CYSTOSCOPY WITH BIOPSY N/A 07/19/2013   Procedure: TRANSURETHRAL RESECTION OF BLADDER NECK;  Surgeon: Bernestine Amass, MD;  Location: [facility];  Service: Urology;  Laterality: N/A;  . LUMBAR LAMINECTOMY Right 08/12/2014   Procedure: Right L2-3 Hemilaminectomy, Removal HNP;  Surgeon: Marybelle Killings, MD;  Location: [facility];  Service: Orthopedics;   Laterality: Right;  . LUMBAR SPINE SURGERY     x 2  . NASAL SINUS SURGERY  40 yrs ago  . PROSTATE BIOPSY  2007, 2010   benign.  Urology - Dr. Ellwood Sayers  . ROTATOR CUFF REPAIR Bilateral    total of 3  . TRANSURETHRAL RESECTION OF PROSTATE  10-23-2009   Social History   Occupational History  . Occupation: Retired Clinical biochemist.     Employer: RETIRED  Tobacco Use  . Smoking status: Former Research scientist (life sciences)  . Smokeless tobacco: Never Used  . Tobacco comment: quit smoking in Mar 2015  Substance and Sexual Activity  . Alcohol use: Yes    Comment: wine occasionally  . Drug use: No  . 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

## 2018-08-11 ENCOUNTER — Ambulatory Visit (AMBULATORY_SURGERY_CENTER): Payer: Self-pay

## 2018-08-11 ENCOUNTER — Other Ambulatory Visit: Payer: Self-pay

## 2018-08-11 VITALS — Ht 60.0 in | Wt 136.4 lb

## 2018-08-11 DIAGNOSIS — Z86010 Personal history of colonic polyps: Secondary | ICD-10-CM

## 2018-08-11 MED ORDER — PEG 3350-KCL-NA BICARB-NACL 420 G PO SOLR: 4000.0000 mL | Freq: Once | ORAL | 0 refills | Status: AC

## 2018-08-11 NOTE — Progress Notes (Signed)
No egg or soy allergy known to patient  No issues with past sedation with any surgeries  or procedures, no intubation problems  No diet pills per patient No home 02 use per patient  No blood thinners per patient  Pt denies issues with constipation  No A fib or A flutter  EMMI video sent to pt's e mail , pt declined    

## 2018-08-12 ENCOUNTER — Ambulatory Visit (INDEPENDENT_AMBULATORY_CARE_PROVIDER_SITE_OTHER): Payer: Medicare Other | Admitting: Family Medicine

## 2018-08-12 ENCOUNTER — Telehealth: Payer: Self-pay | Admitting: Family Medicine

## 2018-08-12 ENCOUNTER — Encounter: Payer: Self-pay | Admitting: Family Medicine

## 2018-08-12 VITALS — BP 125/68 | HR 73 | Temp 98.0°F | Wt 137.7 lb

## 2018-08-12 DIAGNOSIS — I10 Essential (primary) hypertension: Secondary | ICD-10-CM

## 2018-08-12 DIAGNOSIS — M5126 Other intervertebral disc displacement, lumbar region: Secondary | ICD-10-CM

## 2018-08-12 DIAGNOSIS — E782 Mixed hyperlipidemia: Secondary | ICD-10-CM | POA: Diagnosis not present

## 2018-08-12 DIAGNOSIS — N400 Enlarged prostate without lower urinary tract symptoms: Secondary | ICD-10-CM

## 2018-08-12 DIAGNOSIS — B351 Tinea unguium: Secondary | ICD-10-CM | POA: Diagnosis not present

## 2018-08-12 LAB — COMPLETE METABOLIC PANEL WITH GFR
AG RATIO: 1.9 (calc) (ref 1.0–2.5)
ALBUMIN MSPROF: 4.1 g/dL (ref 3.6–5.1)
ALT: 32 U/L (ref 9–46)
AST: 26 U/L (ref 10–35)
Alkaline phosphatase (APISO): 82 U/L (ref 40–115)
BILIRUBIN TOTAL: 0.5 mg/dL (ref 0.2–1.2)
BUN: 24 mg/dL (ref 7–25)
CHLORIDE: 102 mmol/L (ref 98–110)
CO2: 25 mmol/L (ref 20–32)
Calcium: 9.3 mg/dL (ref 8.6–10.3)
Creat: 0.98 mg/dL (ref 0.70–1.18)
GFR, EST AFRICAN AMERICAN: 86 mL/min/{1.73_m2} (ref >=60)
GFR, EST NON AFRICAN AMERICAN: 74 mL/min/{1.73_m2} (ref >=60)
GLOBULIN: 2.2 g/dL (ref 1.9–3.7)
GLUCOSE: 136 mg/dL (ref 65–139)
POTASSIUM: 3.6 mmol/L (ref 3.5–5.3)
SODIUM: 138 mmol/L (ref 135–146)
TOTAL PROTEIN: 6.3 g/dL (ref 6.1–8.1)

## 2018-08-12 MED ORDER — TERBINAFINE HCL 250 MG PO TABS: 250.0000 mg | ORAL_TABLET | Freq: Every day | ORAL | 0 refills | Status: DC

## 2018-08-12 MED ORDER — FINASTERIDE 5 MG PO TABS: 5.0000 mg | ORAL_TABLET | Freq: Every day | ORAL | 1 refills | Status: DC

## 2018-08-12 MED ORDER — ATORVASTATIN CALCIUM 80 MG PO TABS: 40.0000 mg | ORAL_TABLET | Freq: Every day | ORAL | 1 refills | Status: DC

## 2018-08-12 MED ORDER — HYDROCHLOROTHIAZIDE 25 MG PO TABS: 25.0000 mg | ORAL_TABLET | Freq: Every day | ORAL | 1 refills | Status: DC

## 2018-08-12 NOTE — Progress Notes (Signed)
Jim Tucker is a 77 y.o. male who presents to [facility]: [place] today for follow-up onychomycosis, hypertension, hyperlipidemia, back pain.  Jim Tucker was seen about 3 months ago during a wellness visit found to have onychomycosis of the left great toenail and started on Lamisil.  He notes is working but taking a long time to get better.  Tolerates Lamisil well with no abdominal pain.  He takes hydrochlorothiazide, atorvastatin for hypertension and hyperlipidemia.  He tolerates these well with no issues.  No lightheadedness or dizziness.  Additionally he takes finasteride for BPH.  He notes his medication works well.  Additionally has had some back pain exacerbations.  He notes some pain radiating down the right leg in an L3 or L4 nerve distribution.  He was seen by Dr. Sharol Given at [facility] where he was prescribed a prednisone course which worked well.  He notes the pain is pretty much resolved.  He notes that he is traveling back to [place] at the end of this month for approximately 6 months.  ROS as above:  Exam:  BP 125/68   Pulse 73   Temp 98 F (36.7 C) (Oral)   Wt 137 lb 11.2 oz (62.5 kg)   BMI 26.89 kg/m  Wt Readings from Last 5 Encounters:  08/12/18 137 lb 11.2 oz (62.5 kg)  08/11/18 136 lb 6.4 oz (61.9 kg)  07/14/18 137 lb (62.1 kg)  05/21/18 137 lb (62.1 kg)  05/12/18 137 lb (62.1 kg)    Gen: Well NAD HEENT: EOMI,  MMM Lungs: Normal work of breathing. CTABL Heart: RRR no MRG Abd: NABS, Soft. Nondistended, Nontender Exts: Brisk capillary refill, warm and well perfused.  Left first toenail clearing at the proximal left great toenail but yellowish thickened toenail distally still present. L-spine: Nontender to spinal midline normal back motion.  Lab and Radiology Results 2 view radiographs of the lumbar spine shows previous fusion at L4-5.   Hardware is intact. Patient has advanced degenerative changes at L3-4  L2-3 and L1 to. Patient has large osteophytic bone spurs worse on the  right at L2-3.  I personally (independently) visualized and performed the interpretation of the images attached in this note.    Assessment and Plan: 77 y.o. male with  Onychomycosis: Improved but not resolved.  Continue Lamisil recheck metabolic panel.  Hypertension: Well-controlled refill hydrochlorothiazide.  We will dispense 66-month supply as able.  Hyperlipidemia: Tolerating atorvastatin well.  Continue Lipitor.  BPH doing well refill finasteride.  Back pain intermittently waxing and waning.  Likely some L3 radiculopathy present.  Patient much better following prednisone course.  Back-up plan would be epidural steroid injection.  We will recheck patient will returns back from [place] in about 6 months.  Patient declines influenza vaccine. Health maintenance: Patient scheduled for follow-up colonoscopy later this month.   Orders Placed This Encounter  Procedures  . COMPLETE METABOLIC PANEL WITH GFR   Meds ordered this encounter  Medications  . atorvastatin (LIPITOR) 80 MG tablet    Sig: Take 0.5 tablets (40 mg total) by mouth daily.    Dispense:  180 tablet    Refill:  1    Dispense 6 month supply. Pt will be out of the country starting on 09/05/18  . finasteride (PROSCAR) 5 MG tablet    Sig: Take 1 tablet (5 mg total) by mouth daily.    Dispense:  180 tablet    Refill:  1  Dispense 6 month supply. Pt will be out of the country starting on 09/05/18  . hydrochlorothiazide (HYDRODIURIL) 25 MG tablet    Sig: Take 1 tablet (25 mg total) by mouth daily. Dispense 6 month supply. Pt will be out of the country starting on 09/05/18    Dispense:  180 tablet    Refill:  1  . terbinafine (LAMISIL) 250 MG tablet    Sig: Take 1 tablet (250 mg total) by mouth daily.    Dispense:  90 tablet    Refill:  0     Historical information  moved to improve visibility of documentation.  Past Medical History:  Diagnosis Date  . Arthritis    shoulders, back  . BPH (benign prostatic hypertrophy)    takes Proscar daily  . Diverticulosis   . History of colon polyps   . Hyperlipidemia    takes Atorvastatin daily  . Internal hemorrhoid   . LBP (low back pain)    HNP  . Nocturia   . Numbness and tingling    in right leg   Past Surgical History:  Procedure Laterality Date  . BACK SURGERY    . COLONOSCOPY    . CYSTOSCOPY WITH BIOPSY N/A 07/19/2013   Procedure: TRANSURETHRAL RESECTION OF BLADDER NECK;  Surgeon: Bernestine Amass, MD;  Location: [facility];  Service: Urology;  Laterality: N/A;  . LUMBAR LAMINECTOMY Right 08/12/2014   Procedure: Right L2-3 Hemilaminectomy, Removal HNP;  Surgeon: Marybelle Killings, MD;  Location: [facility];  Service: Orthopedics;  Laterality: Right;  . LUMBAR SPINE SURGERY     x 2  . NASAL SINUS SURGERY  40 yrs ago  . PROSTATE BIOPSY  2007, 2010   benign.  Urology - Dr. Ellwood Sayers  . ROTATOR CUFF REPAIR Bilateral    total of 3  . TRANSURETHRAL RESECTION OF PROSTATE  10-23-2009   Social History   Tobacco Use  . Smoking status: Former Research scientist (life sciences)  . Smokeless tobacco: Never Used  . Tobacco comment: quit smoking in Mar 2015  Substance Use Topics  . Alcohol use: Yes    Comment: wine occasionally   family history includes Stomach cancer in his brother.  Medications: Current Outpatient Medications  Medication Sig Dispense Refill  . aspirin 81 MG EC tablet Take by mouth.    Marland Kitchen atorvastatin (LIPITOR) 80 MG tablet Take 0.5 tablets (40 mg total) by mouth daily. 180 tablet 1  . finasteride (PROSCAR) 5 MG tablet Take 1 tablet (5 mg total) by mouth daily. 180 tablet 1  . hydrochlorothiazide (HYDRODIURIL) 25 MG tablet Take 1 tablet (25 mg total) by mouth daily. Dispense 6 month supply. Pt [place] be [place] the [place] starting on 09[place]05/18 180 tablet 1  . terbinafine (LAMISIL) 250 MG tablet Take 1 tablet  (250 mg total) by mouth daily. 90 tablet 0   No current facility-administered medications for this visit.    Allergies  Allergen Reactions  . Other     Peanuts but not sure of reaction Raw Apples swells throat     Discussed warning signs or symptoms. Please see discharge instructions. Patient expresses understanding.

## 2018-08-12 NOTE — Telephone Encounter (Signed)
Noted  

## 2018-08-12 NOTE — Patient Instructions (Signed)
Thank you for coming in today. Recheck in 6 months.  Continue current medicine.  Return sooner if needed.

## 2018-08-12 NOTE — Telephone Encounter (Signed)
FYI: PT NEEDS A MEDICARE WELLNESS EXAM. HE WILL BE OUT OF THE COUNTRY AND WILL RETURN IN MARCH 2020.  HE WILL MAKE APPOINTMENT WHEN HE RETURNS.

## 2018-08-21 ENCOUNTER — Encounter: Payer: Self-pay | Admitting: Gastroenterology

## 2018-08-21 ENCOUNTER — Ambulatory Visit (AMBULATORY_SURGERY_CENTER): Payer: Medicare Other | Admitting: Gastroenterology

## 2018-08-21 VITALS — BP 94/58 | HR 58 | Temp 98.4°F | Resp 14 | Ht 65.0 in | Wt 137.0 lb

## 2018-08-21 DIAGNOSIS — K635 Polyp of colon: Secondary | ICD-10-CM

## 2018-08-21 DIAGNOSIS — Z86010 Personal history of colonic polyps: Secondary | ICD-10-CM | POA: Diagnosis not present

## 2018-08-21 DIAGNOSIS — D125 Benign neoplasm of sigmoid colon: Secondary | ICD-10-CM

## 2018-08-21 DIAGNOSIS — Z1211 Encounter for screening for malignant neoplasm of colon: Secondary | ICD-10-CM | POA: Diagnosis not present

## 2018-08-21 DIAGNOSIS — D124 Benign neoplasm of descending colon: Secondary | ICD-10-CM | POA: Diagnosis not present

## 2018-08-21 DIAGNOSIS — D122 Benign neoplasm of ascending colon: Secondary | ICD-10-CM | POA: Diagnosis not present

## 2018-08-21 MED ORDER — SODIUM CHLORIDE 0.9 % IV SOLN: 500.0000 mL | Freq: Once | INTRAVENOUS | Status: DC

## 2018-08-21 NOTE — Progress Notes (Signed)
Called to room to assist during endoscopic procedure.  Patient ID and intended procedure confirmed with present staff. Received instructions for my participation in the procedure from the performing physician.  

## 2018-08-21 NOTE — Patient Instructions (Signed)
Impression/Recommendations:  Polyp handout given to patient. Diverticulosis handout given to patient. Hemorrhoid handout given to patient. High fiber diet handout given to patient.  Continue present medications.  YOU HAD AN ENDOSCOPIC PROCEDURE TODAY AT [facility]:   Refer to the procedure report that was given to you for any specific questions about what was found during the examination.  If the procedure report does not answer your questions, please call your gastroenterologist to clarify.  If you requested that your care partner not be given the details of your procedure findings, then the procedure report has been included in a sealed envelope for you to review at your convenience later.  YOU SHOULD EXPECT: Some feelings of bloating in the abdomen. Passage of more gas than usual.  Walking can help get rid of the air that was put into your GI tract during the procedure and reduce the bloating. If you had a lower endoscopy (such as a colonoscopy or flexible sigmoidoscopy) you may notice spotting of blood in your stool or on the toilet paper. If you underwent a bowel prep for your procedure, you may not have a normal bowel movement for a few days.  Please Note:  You might notice some irritation and congestion in your nose or some drainage.  This is from the oxygen used during your procedure.  There is no need for concern and it should clear up in a day or so.  SYMPTOMS TO REPORT IMMEDIATELY:   Following lower endoscopy (colonoscopy or flexible sigmoidoscopy):  Excessive amounts of blood in the stool  Significant tenderness or worsening of abdominal pains  Swelling of the abdomen that is new, acute  Fever of 100F or higher  For urgent or emergent issues, a gastroenterologist can be reached at any hour by calling [phone].   DIET:  We do recommend a small meal at first, but then you may proceed to your regular diet.  Drink plenty of fluids but you should avoid  alcoholic beverages for 24 hours.  ACTIVITY:  You should plan to take it easy for the rest of today and you should NOT DRIVE or use heavy machinery until tomorrow (because of the sedation medicines used during the test).    FOLLOW UP: Our staff will call the number listed on your records the next business day following your procedure to check on you and address any questions or concerns that you may have regarding the information given to you following your procedure. If we do not reach you, we will leave a message.  However, if you are feeling well and you are not experiencing any problems, there is no need to return our call.  We will assume that you have returned to your regular daily activities without incident.  If any biopsies were taken you will be contacted by phone or by letter within the next 1-3 weeks.  Please call us at [phone] if you have not heard about the biopsies in 3 weeks.    SIGNATURES/CONFIDENTIALITY: You and/or your care partner have signed paperwork which will be entered into your electronic medical record.  These signatures attest to the fact that that the information above on your After Visit Summary has been reviewed and is understood.  Full responsibility of the confidentiality of this discharge information lies with you and/or your care-partner.

## 2018-08-21 NOTE — Op Note (Signed)
[facility] Patient Name: [facility] Procedure Date: 08/21/2018 3:32 PM MRN: [id number] Endoscopist: Ladene Artist , MD Age: 77 Referring MD:  Date of Birth: 12-19-1940 Gender: Male Account #: [id number] Procedure:                Colonoscopy Indications:              Surveillance: Personal history of adenomatous                            polyps on last colonoscopy 5 years ago Medicines:                Monitored Anesthesia Care Procedure:                Pre-Anesthesia Assessment:                           - Prior to the procedure, a History and Physical                            was performed, and patient medications and                            allergies were reviewed. The patient's tolerance of                            previous anesthesia was also reviewed. The risks                            and benefits of the procedure and the sedation                            options and risks were discussed with the patient.                            All questions were answered, and informed consent                            was obtained. Prior Anticoagulants: The patient has                            taken no previous anticoagulant or antiplatelet                            agents. ASA Grade Assessment: II - A patient with                            mild systemic disease. After reviewing the risks                            and benefits, the patient was deemed in                            satisfactory condition to undergo the procedure.  After obtaining informed consent, the colonoscope                            was passed under direct vision. Throughout the                            procedure, the patient's blood pressure, pulse, and                            oxygen saturations were monitored continuously. The                            Colonoscope was introduced through the anus and                            advanced to the the  cecum, identified by                            appendiceal orifice and ileocecal valve. The                            ileocecal valve, appendiceal orifice, and rectum                            were photographed. The quality of the bowel                            preparation was good. The colonoscopy was performed                            without difficulty. The patient tolerated the                            procedure well. Scope In: 3:42:41 PM Scope Out: 3:56:45 PM Scope Withdrawal Time: 0 hours 12 minutes 24 seconds  Total Procedure Duration: 0 hours 14 minutes 4 seconds  Findings:                 The perianal and digital rectal examinations were                            normal.                           Five sessile polyps were found in the sigmoid colon                            (1), descending colon (1) and ascending colon (3).                            The polyps were 3 to 4 mm in size. These polyps                            were removed with a cold biopsy forceps. Resection  and retrieval were complete.                           Multiple small-mouthed diverticula were found in                            the left colon. There was no evidence of                            diverticular bleeding.                           Internal hemorrhoids were found during                            retroflexion. The hemorrhoids were small and Grade                            I (internal hemorrhoids that do not prolapse).                           The exam was otherwise without abnormality on                            direct and retroflexion views. Complications:            No immediate complications. Estimated blood loss:                            None. Estimated Blood Loss:     Estimated blood loss: none. Impression:               - Five 3 to 4 mm polyps in the sigmoid colon, in                            the descending colon and in the ascending colon,                             removed with a cold biopsy forceps. Resected and                            retrieved.                           - Mild diverticulosis in the left colon. There was                            no evidence of diverticular bleeding.                           - Internal hemorrhoids.                           - The examination was otherwise normal on direct  and retroflexion views. Recommendation:           - Patient has a contact number available for                            emergencies. The signs and symptoms of potential                            delayed complications were discussed with the                            patient. Return to normal activities tomorrow.                            Written discharge instructions were provided to the                            patient.                           - High fiber diet.                           - Continue present medications.                           - Await pathology results. Ladene Artist, MD 08/21/2018 4:03:41 PM This report has been signed electronically.

## 2018-08-21 NOTE — Progress Notes (Signed)
Pt's states no medical or surgical changes since previsit or office visit. 

## 2018-08-21 NOTE — Progress Notes (Signed)
To PACU, VSS. Report to [email or web address] 

## 2018-08-24 ENCOUNTER — Telehealth: Payer: Self-pay | Admitting: *Deleted

## 2018-08-24 NOTE — Telephone Encounter (Signed)
  Follow up Call-  Call back number 08/21/2018  Post procedure Call Back phone  # [phone]  Permission to leave phone message Yes  Some recent data might be hidden     Patient questions:  Do you have a fever, pain , or abdominal swelling? No. Pain Score  0 *  Have you tolerated food without any problems? Yes.    Have you been able to return to your normal activities? Yes.    Do you have any questions about your discharge instructions: Diet   No. Medications  No. Follow up visit  No.  Do you have questions or concerns about your Care? No.  Actions: * If pain score is 4 or above: No action needed, pain <4.

## 2018-08-25 ENCOUNTER — Encounter: Payer: Medicare Other | Admitting: Gastroenterology

## 2018-08-26 DIAGNOSIS — Z23 Encounter for immunization: Secondary | ICD-10-CM | POA: Diagnosis not present

## 2018-08-27 DIAGNOSIS — L820 Inflamed seborrheic keratosis: Secondary | ICD-10-CM | POA: Diagnosis not present

## 2018-08-27 DIAGNOSIS — D485 Neoplasm of uncertain behavior of skin: Secondary | ICD-10-CM | POA: Diagnosis not present

## 2018-08-27 DIAGNOSIS — L821 Other seborrheic keratosis: Secondary | ICD-10-CM | POA: Diagnosis not present

## 2018-09-07 ENCOUNTER — Encounter: Payer: Self-pay | Admitting: Gastroenterology

## 2018-10-15 ENCOUNTER — Encounter: Payer: Self-pay | Admitting: Family Medicine

## 2018-10-15 DIAGNOSIS — K648 Other hemorrhoids: Secondary | ICD-10-CM | POA: Insufficient documentation

## 2019-05-16 NOTE — Progress Notes (Deleted)
Subjective:   Jim Tucker is a 78 y.o. male who presents for Medicare Annual/Subsequent preventive examination.  Review of Systems:  No ROS.  Medicare Wellness Virtual Visit.  Visual/audio telehealth visit, UTA vital signs.   See social history for additional risk factors.       Sleep patterns:    Home Safety/Smoke Alarms: Feels safe in home. Smoke alarms in place.  Living environment;  Seat Belt Safety/Bike Helmet: Wears seat belt.  Male:   CCS-  UTD   PSA-  UTD Lab Results  Component Value Date   PSA 0.4 05/12/2018   PSA 0.4 05/08/2017   PSA 0.29 09/14/2010        Objective:    Vitals: There were no vitals taken for this visit.  There is no height or weight on file to calculate BMI.  Advanced Directives 08/23/2016 08/12/2014 08/10/2014 04/29/2014 07/19/2013  Does Patient Have a Medical Advance Directive? Yes No No Patient does not have advance directive;Patient would not like information Patient does not have advance directive;Patient would not like information;Patient would like information  Copy of [place] in Chart? Yes - - - -  Would patient like information on creating a medical advance directive? - No - patient declined information No - patient declined information - Advance directive packet given    Tobacco Social History   Tobacco Use  Smoking Status Former Smoker  Smokeless Tobacco Never Used  Tobacco Comment   quit smoking in Mar 2015     Counseling given: Not Answered Comment: quit smoking in Mar 2015   Clinical Intake:                       Past Medical History:  Diagnosis Date  . Arthritis    shoulders, back  . BPH (benign prostatic hypertrophy)    takes Proscar daily  . Diverticulosis   . History of colon polyps   . Hyperlipidemia    takes Atorvastatin daily  . Internal hemorrhoid   . LBP (low back pain)    HNP  . Nocturia   . Numbness and tingling    in right leg   Past Surgical History:   Procedure Laterality Date  . BACK SURGERY    . COLONOSCOPY    . CYSTOSCOPY WITH BIOPSY N/A 07/19/2013   Procedure: TRANSURETHRAL RESECTION OF BLADDER NECK;  Surgeon: Bernestine Amass, MD;  Location: [facility];  Service: Urology;  Laterality: N/A;  . LUMBAR LAMINECTOMY Right 08/12/2014   Procedure: Right L2-3 Hemilaminectomy, Removal HNP;  Surgeon: Marybelle Killings, MD;  Location: [facility];  Service: Orthopedics;  Laterality: Right;  . LUMBAR SPINE SURGERY     x 2  . NASAL SINUS SURGERY  40 yrs ago  . PROSTATE BIOPSY  2007, 2010   benign.  Urology - Dr. Ellwood Sayers  . ROTATOR CUFF REPAIR Bilateral    total of 3  . TRANSURETHRAL RESECTION OF PROSTATE  10-23-2009   Family History  Problem Relation Age of Onset  . Stomach cancer Brother   . Colon polyps Neg Hx   . Esophageal cancer Neg Hx   . Rectal cancer Neg Hx   . Pancreatic cancer Neg Hx    Social History   Socioeconomic History  . Marital status: Married    Spouse name: Tretha Sciara  . Number of children: 2   . Years of education: Not on file  . Highest education level: Not on file  Occupational History  . Occupation: Retired Clinical biochemist.     Employer: RETIRED  Social Needs  . Financial resource strain: Not on file  . Food insecurity:    Worry: Not on file    Inability: Not on file  . Transportation needs:    Medical: Not on file    Non-medical: Not on file  Tobacco Use  . Smoking status: Former Research scientist (life sciences)  . Smokeless tobacco: Never Used  . Tobacco comment: quit smoking in Mar 2015  Substance and Sexual Activity  . Alcohol use: Yes    Comment: wine occasionally  . Drug use: No  . 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  Lifestyle  . Physical activity:    Days per week: Not on file    Minutes per session: Not on file  . Stress: Not on file  Relationships  . Social connections:    Talks on phone: Not on file    Gets together:  Not on file    Attends religious service: Not on file    Active member of club or organization: Not on file    Attends meetings of clubs or organizations: Not on file    Relationship status: Not on file  Other Topics Concern  . 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    Outpatient Encounter Medications as of 05/17/2019  Medication Sig  . aspirin 81 MG EC tablet Take by mouth.  Marland Kitchen atorvastatin (LIPITOR) 80 MG tablet Take 0.5 tablets (40 mg total) by mouth daily.  . finasteride (PROSCAR) 5 MG tablet Take 1 tablet (5 mg total) by mouth daily.  . hydrochlorothiazide (HYDRODIURIL) 25 MG tablet Take 1 tablet (25 mg total) by mouth daily. Dispense 6 month supply. [place] the [place]18  . terbinafine (LAMISIL) 250 MG tablet Take 1 tablet (250 mg total) by mouth daily.   No facility-administered encounter medications on file as of 05/17/2019.     Activities of Daily Living No flowsheet data found.  Patient Care Team: Gregor Hams, MD as PCP - General (Family Medicine) Ardis Hughs, MD as Attending Physician (Urology)   Assessment:   This is a routine wellness examination for [name].Physical assessment deferred to PCP.   Exercise Activities and Dietary recommendations   Diet  Breakfast: Lunch:  Dinner:       Goals   None     Fall Risk Fall Risk  05/12/2018 05/08/2017 08/06/2016 04/29/2014  Falls in the past year? No No Yes No  Comment - - Emmi Telephone Survey: data to providers prior to load -  Number falls in past yr: - - 1 -  Comment - - Emmi Telephone Survey Actual Response = 1 -  Injury with Fall? - - Yes -   Is the patient's home free of loose throw rugs in walkways, pet beds, electrical cords, etc?   {Blank single:19197::"yes","no"}      Grab bars in the bathroom? {Blank single:19197::"yes","no"}      Handrails on the stairs?   {Blank  single:19197::"yes","no"}      Adequate lighting?   {Blank single:19197::"yes","no"}   Depression Screen PHQ 2/9 Scores 05/12/2018 05/08/2017 05/08/2017 05/02/2016  PHQ - 2 Score 0 0 0 0    Cognitive Function     6CIT Screen 05/08/2017  What Year? 0  points  What month? 0 points  What time? 0 points  Count back from 20 0 points  Months in reverse 0 points  Repeat phrase 6 points  Total Score 6    Immunization History  Administered Date(s) Administered  . Influenza Split 10/14/2011  . Influenza Whole 09/10/2007, 08/25/2008, 09/14/2010  . Pneumococcal Conjugate-13 04/29/2014  . Pneumococcal Polysaccharide-23 11/13/2006  . Td 11/13/2006  . Tdap 05/02/2016    Screening Tests Health Maintenance  Topic Date Due  . INFLUENZA VACCINE  07/10/2019  . COLONOSCOPY  08/22/2023  . TETANUS/TDAP  05/02/2026  . PNA vac Low Risk Adult  Completed        Plan:   ***  I have personally reviewed and noted the following in the patient's chart:   . Medical and social history . Use of alcohol, tobacco or illicit drugs  . Current medications and supplements . Functional ability and status . Nutritional status . Physical activity . Advanced directives . List of other physicians . Hospitalizations, surgeries, and ER visits in previous 12 months . Vitals . Screenings to include cognitive, depression, and falls . Referrals and appointments  In addition, I have reviewed and discussed with patient certain preventive protocols, quality metrics, and best practice recommendations. A written personalized care plan for preventive services as well as general preventive health recommendations were provided to patient.     Joanne Chars, LPN  8/0/0349

## 2019-05-17 ENCOUNTER — Ambulatory Visit: Payer: Medicare Other

## 2019-07-08 ENCOUNTER — Other Ambulatory Visit: Payer: Self-pay

## 2019-07-15 ENCOUNTER — Other Ambulatory Visit: Payer: Self-pay

## 2019-07-15 ENCOUNTER — Emergency Department (INDEPENDENT_AMBULATORY_CARE_PROVIDER_SITE_OTHER)
Admission: EM | Admit: 2019-07-15 | Discharge: 2019-07-15 | Disposition: A | Discharge status: Other Acute Inpatient Hospital | Payer: Medicare Other | Source: Home / Self Care | Attending: Emergency Medicine | Admitting: Emergency Medicine

## 2019-07-15 ENCOUNTER — Emergency Department (HOSPITAL_COMMUNITY): Payer: Medicare Other

## 2019-07-15 ENCOUNTER — Encounter (HOSPITAL_COMMUNITY): Payer: Self-pay

## 2019-07-15 ENCOUNTER — Inpatient Hospital Stay (HOSPITAL_COMMUNITY)
Admission: EM | Admit: 2019-07-15 | Discharge: 2019-07-22 | DRG: 177 | Disposition: A | Discharge status: Home / Self Care | Payer: Medicare Other | Attending: Internal Medicine | Admitting: Internal Medicine

## 2019-07-15 DIAGNOSIS — R7303 Prediabetes: Secondary | ICD-10-CM | POA: Diagnosis present

## 2019-07-15 DIAGNOSIS — Z79899 Other long term (current) drug therapy: Secondary | ICD-10-CM | POA: Diagnosis not present

## 2019-07-15 DIAGNOSIS — J988 Other specified respiratory disorders: Secondary | ICD-10-CM

## 2019-07-15 DIAGNOSIS — Z9079 Acquired absence of other genital organ(s): Secondary | ICD-10-CM

## 2019-07-15 DIAGNOSIS — R0602 Shortness of breath: Secondary | ICD-10-CM

## 2019-07-15 DIAGNOSIS — Z7982 Long term (current) use of aspirin: Secondary | ICD-10-CM

## 2019-07-15 DIAGNOSIS — E785 Hyperlipidemia, unspecified: Secondary | ICD-10-CM | POA: Diagnosis present

## 2019-07-15 DIAGNOSIS — Z86010 Personal history of colonic polyps: Secondary | ICD-10-CM | POA: Diagnosis not present

## 2019-07-15 DIAGNOSIS — J1289 Other viral pneumonia: Secondary | ICD-10-CM | POA: Diagnosis present

## 2019-07-15 DIAGNOSIS — N400 Enlarged prostate without lower urinary tract symptoms: Secondary | ICD-10-CM | POA: Diagnosis present

## 2019-07-15 DIAGNOSIS — Z800 Family history of malignant neoplasm of digestive organs: Secondary | ICD-10-CM

## 2019-07-15 DIAGNOSIS — Z20822 Contact with and (suspected) exposure to covid-19: Secondary | ICD-10-CM

## 2019-07-15 DIAGNOSIS — Z20828 Contact with and (suspected) exposure to other viral communicable diseases: Secondary | ICD-10-CM

## 2019-07-15 DIAGNOSIS — J069 Acute upper respiratory infection, unspecified: Secondary | ICD-10-CM | POA: Diagnosis not present

## 2019-07-15 DIAGNOSIS — Z91018 Allergy to other foods: Secondary | ICD-10-CM

## 2019-07-15 DIAGNOSIS — F17200 Nicotine dependence, unspecified, uncomplicated: Secondary | ICD-10-CM | POA: Diagnosis present

## 2019-07-15 DIAGNOSIS — R0902 Hypoxemia: Secondary | ICD-10-CM | POA: Diagnosis not present

## 2019-07-15 DIAGNOSIS — E782 Mixed hyperlipidemia: Secondary | ICD-10-CM | POA: Diagnosis not present

## 2019-07-15 DIAGNOSIS — U071 COVID-19: Principal | ICD-10-CM

## 2019-07-15 DIAGNOSIS — J9601 Acute respiratory failure with hypoxia: Secondary | ICD-10-CM | POA: Diagnosis present

## 2019-07-15 DIAGNOSIS — I10 Essential (primary) hypertension: Secondary | ICD-10-CM | POA: Diagnosis present

## 2019-07-15 LAB — COMPREHENSIVE METABOLIC PANEL
ALT: 263 U/L — ABNORMAL HIGH (ref 0–44)
AST: 236 U/L — ABNORMAL HIGH (ref 15–41)
Albumin: 2.5 g/dL — ABNORMAL LOW (ref 3.5–5.0)
Alkaline Phosphatase: 198 U/L — ABNORMAL HIGH (ref 38–126)
Anion gap: 12 (ref 5–15)
BUN: 19 mg/dL (ref 8–23)
CO2: 21 mmol/L — ABNORMAL LOW (ref 22–32)
Calcium: 8.1 mg/dL — ABNORMAL LOW (ref 8.9–10.3)
Chloride: 104 mmol/L (ref 98–111)
Creatinine, Ser: 0.69 mg/dL (ref 0.61–1.24)
GFR calc Af Amer: >60 mL/min (ref >60)
GFR calc non Af Amer: >60 mL/min (ref >60)
Glucose, Bld: 131 mg/dL — ABNORMAL HIGH (ref 70–99)
Potassium: 3.3 mmol/L — ABNORMAL LOW (ref 3.5–5.1)
Sodium: 137 mmol/L (ref 135–145)
Total Bilirubin: 1.6 mg/dL — ABNORMAL HIGH (ref 0.3–1.2)
Total Protein: 5.9 g/dL — ABNORMAL LOW (ref 6.5–8.1)

## 2019-07-15 LAB — SARS CORONAVIRUS 2 BY RT PCR (HOSPITAL ORDER, PERFORMED IN ~~LOC~~ HOSPITAL LAB): SARS Coronavirus 2: POSITIVE — AB

## 2019-07-15 LAB — CBC WITH DIFFERENTIAL/PLATELET
Abs Immature Granulocytes: 0.13 10*3/uL — ABNORMAL HIGH (ref 0.00–0.07)
Basophils Absolute: 0 10*3/uL (ref 0.0–0.1)
Basophils Relative: 0 %
Eosinophils Absolute: 0 10*3/uL (ref 0.0–0.5)
Eosinophils Relative: 0 %
HCT: 45.5 % (ref 39.0–52.0)
Hemoglobin: 15 g/dL (ref 13.0–17.0)
Immature Granulocytes: 1 %
Lymphocytes Relative: 4 %
Lymphs Abs: 0.4 10*3/uL — ABNORMAL LOW (ref 0.7–4.0)
MCH: 30.9 pg (ref 26.0–34.0)
MCHC: 33 g/dL (ref 30.0–36.0)
MCV: 93.8 fL (ref 80.0–100.0)
Monocytes Absolute: 0.5 10*3/uL (ref 0.1–1.0)
Monocytes Relative: 5 %
Neutro Abs: 9 10*3/uL — ABNORMAL HIGH (ref 1.7–7.7)
Neutrophils Relative %: 90 %
Platelets: 182 10*3/uL (ref 150–400)
RBC: 4.85 MIL/uL (ref 4.22–5.81)
RDW: 14.6 % (ref 11.5–15.5)
WBC: 10 10*3/uL (ref 4.0–10.5)
nRBC: 0 % (ref 0.0–0.2)

## 2019-07-15 LAB — C-REACTIVE PROTEIN
CRP: 18.7 mg/dL — ABNORMAL HIGH (ref <1.0)
CRP: 18.1 mg/dL — ABNORMAL HIGH (ref <1.0)

## 2019-07-15 LAB — LACTATE DEHYDROGENASE: LDH: 446 U/L — ABNORMAL HIGH (ref 98–192)

## 2019-07-15 LAB — TROPONIN I (HIGH SENSITIVITY)
Troponin I (High Sensitivity): 9 ng/L (ref <18)
Troponin I (High Sensitivity): 8 ng/L (ref <18)

## 2019-07-15 LAB — FIBRINOGEN: Fibrinogen: >800 mg/dL — ABNORMAL HIGH (ref 210–475)

## 2019-07-15 LAB — LACTIC ACID, PLASMA: Lactic Acid, Venous: 1.4 mmol/L (ref 0.5–1.9)

## 2019-07-15 LAB — PROCALCITONIN: Procalcitonin: 0.16 ng/mL

## 2019-07-15 LAB — D-DIMER, QUANTITATIVE
D-Dimer, Quant: 2.14 ug/mL-FEU — ABNORMAL HIGH (ref 0.00–0.50)
D-Dimer, Quant: 1.77 ug/mL-FEU — ABNORMAL HIGH (ref 0.00–0.50)

## 2019-07-15 LAB — TRIGLYCERIDES: Triglycerides: 111 mg/dL (ref <150)

## 2019-07-15 LAB — FERRITIN: Ferritin: 1855 ng/mL — ABNORMAL HIGH (ref 24–336)

## 2019-07-15 MED ORDER — ZOLPIDEM TARTRATE 5 MG PO TABS
5.0000 mg | ORAL_TABLET | Freq: Every evening | ORAL | Status: DC | PRN
  Administered 2019-07-18 – 2019-07-21 (×4): 5 mg via ORAL
  Filled 2019-07-15 (×4): qty 1

## 2019-07-15 MED ORDER — SODIUM CHLORIDE 0.9 % IV SOLN: 250.0000 mL | INTRAVENOUS | Status: DC | PRN

## 2019-07-15 MED ORDER — DOCUSATE SODIUM 100 MG PO CAPS
100.0000 mg | ORAL_CAPSULE | Freq: Two times a day (BID) | ORAL | Status: DC
  Administered 2019-07-15 – 2019-07-22 (×6): 100 mg via ORAL
  Filled 2019-07-15 (×11): qty 1

## 2019-07-15 MED ORDER — ASPIRIN 81 MG PO TBEC
81.0000 mg | DELAYED_RELEASE_TABLET | Freq: Every day | ORAL | Status: DC
  Administered 2019-07-15 – 2019-07-22 (×8): 81 mg via ORAL
  Filled 2019-07-15 (×15): qty 1

## 2019-07-15 MED ORDER — METHYLPREDNISOLONE SODIUM SUCC 40 MG IJ SOLR
40.0000 mg | Freq: Two times a day (BID) | INTRAMUSCULAR | Status: DC
  Administered 2019-07-15 – 2019-07-22 (×15): 40 mg via INTRAVENOUS
  Filled 2019-07-15 (×15): qty 1

## 2019-07-15 MED ORDER — ONDANSETRON HCL 4 MG PO TABS: 4.0000 mg | ORAL_TABLET | Freq: Four times a day (QID) | ORAL | Status: DC | PRN

## 2019-07-15 MED ORDER — FINASTERIDE 5 MG PO TABS
5.0000 mg | ORAL_TABLET | Freq: Every day | ORAL | Status: DC
  Administered 2019-07-15 – 2019-07-22 (×8): 5 mg via ORAL
  Filled 2019-07-15 (×10): qty 1

## 2019-07-15 MED ORDER — SODIUM CHLORIDE 0.9% FLUSH
3.0000 mL | Freq: Two times a day (BID) | INTRAVENOUS | Status: DC
  Administered 2019-07-15 – 2019-07-21 (×13): 3 mL via INTRAVENOUS

## 2019-07-15 MED ORDER — SODIUM CHLORIDE 0.9% FLUSH: 3.0000 mL | INTRAVENOUS | Status: DC | PRN

## 2019-07-15 MED ORDER — SODIUM CHLORIDE 0.9% FLUSH
3.0000 mL | Freq: Two times a day (BID) | INTRAVENOUS | Status: DC
  Administered 2019-07-15 – 2019-07-22 (×6): 3 mL via INTRAVENOUS

## 2019-07-15 MED ORDER — ENOXAPARIN SODIUM 40 MG/0.4ML ~~LOC~~ SOLN
40.0000 mg | SUBCUTANEOUS | Status: DC
  Administered 2019-07-15 – 2019-07-21 (×7): 40 mg via SUBCUTANEOUS
  Filled 2019-07-15 (×7): qty 0.4

## 2019-07-15 MED ORDER — HYDROCHLOROTHIAZIDE 25 MG PO TABS: 25.0000 mg | ORAL_TABLET | Freq: Every day | ORAL | Status: DC

## 2019-07-15 MED ORDER — POLYETHYLENE GLYCOL 3350 17 G PO PACK: 17.0000 g | PACK | Freq: Every day | ORAL | Status: DC | PRN

## 2019-07-15 MED ORDER — BISACODYL 5 MG PO TBEC: 5.0000 mg | DELAYED_RELEASE_TABLET | Freq: Every day | ORAL | Status: DC | PRN

## 2019-07-15 MED ORDER — OXYCODONE HCL 5 MG PO TABS: 5.0000 mg | ORAL_TABLET | ORAL | Status: DC | PRN

## 2019-07-15 MED ORDER — ONDANSETRON HCL 4 MG/2ML IJ SOLN: 4.0000 mg | Freq: Four times a day (QID) | INTRAMUSCULAR | Status: DC | PRN

## 2019-07-15 MED ORDER — ATORVASTATIN CALCIUM 40 MG PO TABS
40.0000 mg | ORAL_TABLET | Freq: Every day | ORAL | Status: DC
  Administered 2019-07-15 – 2019-07-16 (×2): 40 mg via ORAL
  Filled 2019-07-15 (×2): qty 1

## 2019-07-15 MED ORDER — ACETAMINOPHEN 325 MG PO TABS
650.0000 mg | ORAL_TABLET | Freq: Four times a day (QID) | ORAL | Status: DC | PRN
  Administered 2019-07-20: 21:00:00 650 mg via ORAL
  Filled 2019-07-15: qty 2

## 2019-07-15 NOTE — ED Notes (Signed)
[facility] here to transport pt 

## 2019-07-15 NOTE — ED Notes (Signed)
Pt daughter Elvera Maria [phone]

## 2019-07-15 NOTE — ED Triage Notes (Signed)
Pt came home from [place] on Saturday, and was having SOB and generalized body aches.  It has progressed since.

## 2019-07-15 NOTE — Progress Notes (Signed)
With prior approval from the charge nurse, Rod Holler, the pt's Wife dropped off clothing that will be needed for the day of d/c, briefs, and a phone charger.

## 2019-07-15 NOTE — Progress Notes (Signed)
Wife called/updated regarding the pt's current status and arrival to [facility].

## 2019-07-15 NOTE — H&P (Signed)
History and Physical    [facility] [id number] DOB: 1940-12-15 DOA: 07/15/2019  PCP: Gregor Hams, MD Consultants:  Glenard Haring - orthopedics; Louis Meckel - urology Patient coming from:  Home - lives with wife; NOK: Wife, Tretha Sciara, [phone]  Chief Complaint: Myalgias and SOB  HPI: Jim Tucker is a 78 y.o. male with medical history significant of low back pain; HLD; and BPH presenting with SOB and myalgias.  Patient went to [facility] for 4 days last week, returning Saturday.  He was visiting his niece.  He reports being tested while there, despite being asymptomatic, and being negative.  He began feeling bad shortly after his plane landed on Saturday with chest fullness, SOB.  This has progressed.  Denies significant cough - although he had productive cough as I entered the room.  Denies fever.  +mylagias.  +loss of taste/smell.  He is worried that he waited too long and is going to die.  His wife is currently at work (she works in a place that [employer]).   ED Course:  COVID infection after going to [place].  Review of Systems: As per HPI; otherwise review of systems reviewed and negative.   Ambulatory Status:  Ambulates without assistance  Past Medical History:  Diagnosis Date  . Arthritis    shoulders, back  . BPH (benign prostatic hypertrophy)    takes Proscar daily  . Diverticulosis   . History of colon polyps   . Hyperlipidemia    takes Atorvastatin daily  . Internal hemorrhoid   . LBP (low back pain)    HNP  . Nocturia   . Numbness and tingling    in right leg    Past Surgical History:  Procedure Laterality Date  . BACK SURGERY    . COLONOSCOPY    . CYSTOSCOPY WITH BIOPSY N/A 07/19/2013   Procedure: TRANSURETHRAL RESECTION OF BLADDER NECK;  Surgeon: Bernestine Amass, MD;  Location: [facility];  Service: Urology;  Laterality: N/A;  . LUMBAR LAMINECTOMY Right 08/12/2014   Procedure: Right L2-3 Hemilaminectomy, Removal HNP;  Surgeon: Marybelle Killings, MD;  Location: [place];  Service: Orthopedics;  Laterality: Right;  . LUMBAR SPINE SURGERY     x 2  . NASAL SINUS SURGERY  40 yrs ago  . PROSTATE BIOPSY  2007, 2010   benign.  Urology - Dr. Ellwood Sayers  . ROTATOR CUFF REPAIR Bilateral    total of 3  . TRANSURETHRAL RESECTION OF PROSTATE  10-23-2009    Social History   Socioeconomic History  . Marital status: Married    Spouse name: Tretha Sciara  . Number of children: 2   . Years of education: Not on file  . Highest education level: Not on file  Occupational History  . Occupation: Retired Clinical biochemist.     Employer: RETIRED  Social Needs  . Financial resource strain: Not on file  . Food insecurity    Worry: Not on file    Inability: Not on file  . Transportation needs    Medical: Not on file    Non-medical: Not on file  Tobacco Use  . Smoking status: Former Research scientist (life sciences)  . Smokeless tobacco: Never Used  . Tobacco comment: quit smoking in Mar 2015  Substance and Sexual Activity  . Alcohol use: Yes    Comment: wine occasionally  . Drug use: No  . Sexual activity: Not Currently    Comment: retired Clinical biochemist, married, 2 grown kids, walks 30 mis daily and lifts  weights.  Lifestyle  . Physical activity    Days per week: Not on file    Minutes per session: Not on file  . Stress: Not on file  Relationships  . Social Herbalist on phone: Not on file    Gets together: Not on file    Attends religious service: Not on file    Active member of club or organization: Not on file    Attends meetings of clubs or organizations: Not on file    Relationship status: Not on file  . Intimate partner violence    Fear of current or ex partner: Not on file    Emotionally abused: Not on file    Physically abused: Not on file    Forced sexual activity: Not on file  Other Topics Concern  . 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    Allergies   Allergen Reactions  . Other Other (See Comments)     Raw Apples swells throat    Family History  Problem Relation Age of Onset  . Stomach cancer Brother   . Colon polyps Neg Hx   . Esophageal cancer Neg Hx   . Rectal cancer Neg Hx   . Pancreatic cancer Neg Hx     Prior to Admission medications   Medication Sig Start Date End Date Taking? Authorizing Provider  aspirin 81 MG EC tablet Take by mouth.    [provider]  atorvastatin (LIPITOR) 80 MG tablet Take 0.5 tablets (40 mg total) by mouth daily. 08/12/18   Gregor Hams, MD  finasteride (PROSCAR) 5 MG tablet Take 1 tablet (5 mg total) by mouth daily. 08/12/18   Gregor Hams, MD  hydrochlorothiazide (HYDRODIURIL) 25 MG tablet Take 1 tablet (25 mg total) by mouth daily. Dispense 6 month supply. [place]18 08/12/18   Gregor Hams, MD  terbinafine (LAMISIL) 250 MG tablet Take 1 tablet (250 mg total) by mouth daily. 08/12/18   Gregor Hams, MD    Physical Exam: Vitals:   07/15/19 1300 07/15/19 1330 07/15/19 1345 07/15/19 1522  BP: 132/68 130/70 131/77 (!) 142/73  Pulse: 63 64 66 (!) 58  Resp: (!) 23 (!) 31 (!) 30 (!) 22  Temp:      TempSrc:      SpO2: 94% 94% 95% 94%  Weight:      Height:         . General:  Appears calm and comfortable and is NAD . Eyes:  PERRL, EOMI, normal lids, iris . ENT:  grossly normal hearing, lips & tongue, mmm . Neck:  no LAD, masses or thyromegaly . Cardiovascular:  RRR, no m/r/g. No LE edema.  Marland Kitchen Respiratory:   CTA bilaterally with no wheezes/rales/rhonchi.  Very mildly increased respiratory effort on 3L [place] O2. . Abdomen:  soft, NT, ND, NABS . Skin:  no rash or induration seen on limited exam . Musculoskeletal:  grossly normal tone BUE/BLE, good ROM, no bony abnormality . Psychiatric:  grossly normal mood and affect, speech fluent and appropriate, AOx3 . Neurologic:  CN 2-12 grossly intact, moves all extremities in coordinated fashion, sensation  intact    Radiological Exams on Admission: Dg Chest Port 1 View  Result Date: 07/15/2019 CLINICAL DATA:  Shortness of breath EXAM: PORTABLE CHEST 1 VIEW COMPARISON:  08/10/2014 FINDINGS: Heart size is within  normal limits. Calcific aortic knob. No focal airspace consolidation, pleural effusion, or pneumothorax. IMPRESSION: No acute cardiopulmonary findings. Electronically Signed   By: Davina Poke M.D.   On: 07/15/2019 11:55    EKG: pending   Labs on Admission: I have personally reviewed the available labs and imaging studies at the time of the admission.  Pertinent labs:   K+ 3.3 CO2 21 Glucose 131 AP 198 Albumin 2.5 AST 236/ALT 263/Bili 1.6 LDH 446 HS troponin 9, 8 Ferritin 1855 CRP 18.7 Lactate 1.4 Procalcitonin 0.16 CBC WNL with lymphopenia D-dimer 2.14 Fibrinogen >800  COVID POSITIVE Blood cultures pending  Assessment/Plan Principal Problem:   Respiratory tract infection due to COVID-19 virus Active Problems:   Hyperlipidemia   BPH (benign prostatic hyperplasia)   HTN (hypertension)   Prediabetes   Acute respiratory failure with hypoxia associated with COVID-19 infection -Patient with presenting with SOB and myalgias; also with cough productive of sputum -+loss of smell and taste without the presence of other GI symptoms -Possible contact for COVID with recent travel to [place] -He does not have a usual home O2 requirement and is currently requiring 3L [place] O2 -COVID POSITIVE -The patient has comorbidities which may increase the risk for ARDS/MODS including: age, HTN, pre-D -Pertinent labs concerning for COVID include lymphopenia; increased BUN/Creatinine; increased LFTs; markedly elevated D-dimer (>1); markedly elevated CRP (>7); markedly increased ferritin; markedly increased fibrinogen -CXR currently negative -Will not treat with broad-spectrum antibiotics despite procalcitonin >0.1 since he does not currently have CXR findings. -Will admit to [facility] for  further evaluation, close monitoring, and treatment -Monitor on telemetry x at least 24 hours -At this time, will attempt to avoid use of aerosolized medications and use HFAs instead -Will check daily labs including BMP with Mag, Phos; LFTs; CBC with differential; CRP (q12h); ferritin; fibrinogen; D-dimer (q12h) -Will order steroids (more than 1 mg/kg divided BID given severity of inflammatory marker elevation)  -No Remdesivir (pharmacy consult) for now given +COVID test, and hypoxia <94% on room air BUT negative CXR -If the patient shows clinical deterioration, consider transfer to ICU with PCCM consultation -Will attempt to maintain euvolemia to a net negative fluid status -Will ask the patient to maintain an awake prone position for 16+ hours a day, if possible, with a minimum of 2-3 hours at a time -With D-dimer <5, will use standard-dosed Lovenox for DVT prevention -Patient was seen wearing full PPE including: gown, gloves, head cover, N95, and face shield; donning and doffing was in compliance with current standards.  HTN -Previously on HCTZ; resume if BP remains elevated  HLD -Continue Lipitor  BPH -Continue Proscar  Pre-diabetes -A1c in 6/19 was 6.2 -Current glucose is 131 -Would follow with fasting labs only at this time   DVT prophylaxis:  Lovenox  Code Status:  Full - confirmed with patient Family Communication: None present; I spoke with the patient's daughter by telephone. Disposition Plan:  Home once clinically improved Consults called: None  Admission status: Admit - It is my clinical opinion that admission to INPATIENT is reasonable and necessary because of the expectation that this patient will require hospital care that crosses at least 2 midnights to treat this condition based on the medical complexity of the problems presented.  Given the aforementioned information, the predictability of an adverse outcome is felt to be significant.    Karmen Bongo MD [facility]   How to contact the [facility] Attending or Consulting provider [place] or covering provider during after hours 7P -  7A, for this patient?  1. Check the care team in [facility] and look for a) attending/consulting [facility] provider listed and b) the [facility] team listed 2. Log into [email or web address] and use 's universal password to access. If you do not have the password, please contact the hospital operator. 3. Locate the [facility] provider you are looking for under [facility] and page to a number that you can be directly reached. 4. If you still have difficulty reaching the provider, please page the [facility] (Director on Call) for the Hospitalists listed on amion for assistance.   07/15/2019, 3:43 PM

## 2019-07-15 NOTE — ED Provider Notes (Signed)
[facility] CARE    CSN: [id number] Arrival date & time: 07/15/19  0805     History   Chief Complaint Chief Complaint  Patient presents with  . Shortness of Breath  . Generalized Body Aches  . Fever    HPI Jim Tucker is a 78 y.o. male.   HPI Patient enters with a chief complaint of shortness of breath.  Pertinent history reveals patient was in [place] last week he returned on Saturday but that evening he developed shortness of breath myalgias and fever.  He has subsequently lost his ability to taste and smell.  He presents with progressive shortness of breath chest discomfort. Past Medical History:  Diagnosis Date  . Arthritis    shoulders, back  . BPH (benign prostatic hypertrophy)    takes Proscar daily  . Diverticulosis   . History of colon polyps   . Hyperlipidemia    takes Atorvastatin daily  . Internal hemorrhoid   . LBP (low back pain)    HNP  . Nocturia   . Numbness and tingling    in right leg    Patient Active Problem List   Diagnosis Date Noted  . Internal hemorrhoid 10/15/2018  . Onychomycosis 08/12/2018  . Prediabetes 05/12/2018  . HTN (hypertension) 02/25/2017  . HNP (herniated nucleus pulposus), lumbar 08/08/2014  . COLONIC POLYPS 03/28/2008  . DIVERTICULOSIS OF COLON 03/28/2008  . BPH (benign prostatic hyperplasia) 09/10/2007  . PTERYGIUM NOS 03/02/2007  . DYSPEPSIA 12/03/2006  . SEBORRHEIC KERATOSIS 11/18/2006  . Hyperlipidemia 11/13/2006  . DYSRHYTHMIA, CARDIAC NOS 11/13/2006    Past Surgical History:  Procedure Laterality Date  . BACK SURGERY    . COLONOSCOPY    . CYSTOSCOPY WITH BIOPSY N/A 07/19/2013   Procedure: TRANSURETHRAL RESECTION OF BLADDER NECK;  Surgeon: Bernestine Amass, MD;  Location: [facility];  Service: Urology;  Laterality: N/A;  . LUMBAR LAMINECTOMY Right 08/12/2014   Procedure: Right L2-3 Hemilaminectomy, Removal HNP;  Surgeon: Marybelle Killings, MD;  Location: [place];  Service: Orthopedics;   Laterality: Right;  . LUMBAR SPINE SURGERY     x 2  . NASAL SINUS SURGERY  40 yrs ago  . PROSTATE BIOPSY  2007, 2010   benign.  Urology - Dr. Ellwood Sayers  . ROTATOR CUFF REPAIR Bilateral    total of 3  . TRANSURETHRAL RESECTION OF PROSTATE  10-23-2009       Home Medications    Prior to Admission medications   Medication Sig Start Date End Date Taking? Authorizing Provider  aspirin 81 MG EC tablet Take by mouth.    [provider]  atorvastatin (LIPITOR) 80 MG tablet Take 0.5 tablets (40 mg total) by mouth daily. 08/12/18   Gregor Hams, MD  finasteride (PROSCAR) 5 MG tablet Take 1 tablet (5 mg total) by mouth daily. 08/12/18   Gregor Hams, MD  hydrochlorothiazide (HYDRODIURIL) 25 MG tablet Take 1 tablet (25 mg total) by mouth daily. Dispense 6 month supply. [place]18 08/12/18   Gregor Hams, MD  terbinafine (LAMISIL) 250 MG tablet Take 1 tablet (250 mg total) by mouth daily. 08/12/18   Gregor Hams, MD    Family History Family History  Problem Relation Age of Onset  . Stomach cancer Brother   . Colon polyps Neg Hx   . Esophageal cancer Neg Hx   . Rectal cancer Neg Hx   . Pancreatic cancer Neg Hx  Social History Social History   Tobacco Use  . Smoking status: Former Research scientist (life sciences)  . Smokeless tobacco: Never Used  . Tobacco comment: quit smoking in Mar 2015  Substance Use Topics  . Alcohol use: Yes    Comment: wine occasionally  . Drug use: No     Allergies   Other   Review of Systems Review of Systems  Constitutional: Positive for chills and fever.  HENT:       He has had difficulty with taste and smell  Eyes: Negative.   Respiratory: Positive for cough, chest tightness and shortness of breath. Negative for wheezing.   Gastrointestinal: Negative.   Musculoskeletal: Positive for myalgias.     Physical Exam Triage Vital Signs ED Triage Vitals  Enc Vitals Group     BP 07/15/19 0823 127/64     Pulse Rate 07/15/19  0823 78     Resp 07/15/19 0823 (!) 22     Temp 07/15/19 0823 (!) 100.4 F (38 C)     Temp Source 07/15/19 0823 Oral     SpO2 07/15/19 0823 (!) 86 %     Weight 07/15/19 0824 130 lb (59 kg)     Height 07/15/19 0824 5\' 2"  (1.575 m)     Head Circumference --      Peak Flow --      Pain Score --      Pain Loc --      Pain Edu? --      Excl. in [place]? --    No data found.  Updated Vital Signs BP 127/64 (BP Location: Right Arm)   Pulse 78   Temp (!) 100.4 F (38 C) (Oral)   Resp (!) 22   Ht 5\' 2"  (1.575 m)   Wt 59 kg   SpO2 (!) 86% Comment: put on 2 L O2.  Pulse ox up to 90  BMI 23.78 kg/m   Visual Acuity Right Eye Distance:   Left Eye Distance:   Bilateral Distance:    Right Eye Near:   Left Eye Near:    Bilateral Near:     Physical Exam Constitutional:      Appearance: He is well-developed.  HENT:     Head: Normocephalic.  Neck:     Musculoskeletal: Normal range of motion.  Cardiovascular:     Rate and Rhythm: Normal rate.  Pulmonary:     Effort: Tachypnea present.  Musculoskeletal: Normal range of motion.  Skin:    General: Skin is warm and dry.  Neurological:     General: No focal deficit present.     Mental Status: He is alert.  Psychiatric:        Mood and Affect: Mood normal.        Behavior: Behavior normal.      UC Treatments / Results  Labs (all labs ordered are listed, but only abnormal results are displayed) Labs Reviewed - No data to display  EKG   Radiology Dg Chest [facility] 1 View  Result Date: 07/15/2019 CLINICAL DATA:  Shortness of breath EXAM: PORTABLE CHEST 1 VIEW COMPARISON:  08/10/2014 FINDINGS: Heart size is within normal limits. Calcific aortic knob. No focal airspace consolidation, pleural effusion, or pneumothorax. IMPRESSION: No acute cardiopulmonary findings. Electronically Signed   By: Davina Poke M.D.   On: 07/15/2019 11:55    Procedures Procedures (including critical care time)  Medications Ordered in UC Medications -  No data to display  Initial Impression / Assessment and Plan / UC Course  I have reviewed the triage vital signs and the nursing notes. Patient presents to the urgent care after recent travel to [place].  He returned on Saturday.  That evening he began with fever myalgias and shortness of breath.  He has had progressive shortness of breath since that time.  He presents today primarily because of fever myalgias chest discomfort and shortness of breath with cough.  911 was called I did speak with infection prevention at [phone].  He has referred to will a [facility] emergency room to have emergent COVID testing and evaluation.  He is currently on O2 with improvement in oxygenation to 92%. Pertinent labs & imaging results that were available during my care of the patient were reviewed by me and considered in my medical decision making (see chart for details).     Final Clinical Impressions(s) / UC Diagnoses   Final diagnoses:  Suspected Covid-19 Virus Infection   Discharge Instructions   None    ED Prescriptions    None     Controlled Substance Prescriptions [place] Controlled Substance Registry consulted? Not Applicable   Darlyne Russian, MD 07/15/19 [phone]

## 2019-07-15 NOTE — ED Provider Notes (Signed)
[facility] EMERGENCY DEPARTMENT Provider Note   CSN: [id number] Arrival date & time: 07/15/19  6415     History   Chief Complaint Chief Complaint  Patient presents with  . possible COVID    HPI Jim Tucker is a 78 y.o. male.     The history is provided by the patient. No language interpreter was used.    78 year old male with history of hypertension, prediabetes, hyperlipidemia sent here from urgent care center with concern for potential COVID-19 infection. UCC provider report pt recently traveled back from [place] last week and for the past 5 days he has had increase shortness of breath, fever, myalgias, loss of taste and smells and generalized weakness.  Therefore patient was recommended to come to ER for further evaluation.  Patient however states that he is having pleuritic chest pain, shortness of breath for the past 5 days.  He admits that he travel down to [place] by plane and stated for 4 days.  While he was in [place], he did have a rapid COVID-19 test that was negative.  He did not have any symptoms at that time.  Currently he does endorse shortness of breath.  He does not complain of  productive cough.  He denies any myalgias.  Denies any loss of taste or smell but admits to having decrease in appetite.  No diaphoresis.  No exertional chest pain.  Denies any nausea vomiting or diarrhea or dysuria.  He is an occasional smoker.  No prior history of PE or DVT and denies any calf pain or leg swelling.  Past Medical History:  Diagnosis Date  . Arthritis    shoulders, back  . BPH (benign prostatic hypertrophy)    takes Proscar daily  . Diverticulosis   . History of colon polyps   . Hyperlipidemia    takes Atorvastatin daily  . Internal hemorrhoid   . LBP (low back pain)    HNP  . Nocturia   . Numbness and tingling    in right leg    Patient Active Problem List   Diagnosis Date Noted  . Internal hemorrhoid 10/15/2018  . Onychomycosis 08/12/2018  .  Prediabetes 05/12/2018  . HTN (hypertension) 02/25/2017  . HNP (herniated nucleus pulposus), lumbar 08/08/2014  . COLONIC POLYPS 03/28/2008  . DIVERTICULOSIS OF COLON 03/28/2008  . BPH (benign prostatic hyperplasia) 09/10/2007  . PTERYGIUM NOS 03/02/2007  . DYSPEPSIA 12/03/2006  . SEBORRHEIC KERATOSIS 11/18/2006  . Hyperlipidemia 11/13/2006  . DYSRHYTHMIA, CARDIAC NOS 11/13/2006    Past Surgical History:  Procedure Laterality Date  . BACK SURGERY    . COLONOSCOPY    . CYSTOSCOPY WITH BIOPSY N/A 07/19/2013   Procedure: TRANSURETHRAL RESECTION OF BLADDER NECK;  Surgeon: Bernestine Amass, MD;  Location: [facility];  Service: Urology;  Laterality: N/A;  . LUMBAR LAMINECTOMY Right 08/12/2014   Procedure: Right L2-3 Hemilaminectomy, Removal HNP;  Surgeon: Marybelle Killings, MD;  Location: [facility];  Service: Orthopedics;  Laterality: Right;  . LUMBAR SPINE SURGERY     x 2  . NASAL SINUS SURGERY  40 yrs ago  . PROSTATE BIOPSY  2007, 2010   benign.  Urology - Dr. Ellwood Sayers  . ROTATOR CUFF REPAIR Bilateral    total of 3  . TRANSURETHRAL RESECTION OF PROSTATE  10-23-2009        Home Medications    Prior to Admission medications   Medication Sig Start Date End Date Taking? Authorizing Provider  aspirin 81 MG  EC tablet Take by mouth.    [provider]  atorvastatin (LIPITOR) 80 MG tablet Take 0.5 tablets (40 mg total) by mouth daily. 08/12/18   Gregor Hams, MD  finasteride (PROSCAR) 5 MG tablet Take 1 tablet (5 mg total) by mouth daily. 08/12/18   Gregor Hams, MD  hydrochlorothiazide (HYDRODIURIL) 25 MG tablet Take 1 tablet (25 mg total) by mouth daily. Dispense 6 month supply. Pt will be out of the country starting on 09[place]05[place]18 08/12/18   Gregor Hams, MD  terbinafine (LAMISIL) 250 MG tablet Take 1 tablet (250 mg total) by mouth daily. 08/12/18   Gregor Hams, MD    Family History Family History  Problem Relation Age of Onset  . Stomach cancer Brother   . Colon polyps  Neg Hx   . Esophageal cancer Neg Hx   . Rectal cancer Neg Hx   . Pancreatic cancer Neg Hx     Social History Social History   Tobacco Use  . Smoking status: Former Research scientist (life sciences)  . Smokeless tobacco: Never Used  . Tobacco comment: quit smoking in Mar 2015  Substance Use Topics  . Alcohol use: Yes    Comment: wine occasionally  . Drug use: No     Allergies   Other   Review of Systems Review of Systems  All other systems reviewed and are negative.    Physical Exam Updated Vital Signs BP 132/71   Pulse 66   Temp 98.6 F (37 C) (Oral)   Resp (!) 31   Ht 5' 2" (1.575 m)   Wt 54.4 kg   SpO2 94%   BMI 21.95 kg/m   Physical Exam Vitals signs and nursing note reviewed.  Constitutional:      General: He is not in acute distress.    Appearance: He is well-developed.  HENT:     Head: Atraumatic.  Eyes:     Conjunctiva/sclera: Conjunctivae normal.  Neck:     Musculoskeletal: Neck supple.  Cardiovascular:     Rate and Rhythm: Normal rate and regular rhythm.     Pulses: Normal pulses.     Heart sounds: Normal heart sounds.  Pulmonary:     Effort: Pulmonary effort is normal.     Breath sounds: Normal breath sounds. No wheezing, rhonchi or rales.  Abdominal:     Palpations: Abdomen is soft.     Tenderness: There is no abdominal tenderness.  Musculoskeletal:        General: No swelling.  Skin:    Findings: No rash.  Neurological:     Mental Status: He is alert and oriented to person, place, and time.  Psychiatric:        Mood and Affect: Mood normal.      ED Treatments / Results  Labs (all labs ordered are listed, but only abnormal results are displayed) Labs Reviewed  SARS CORONAVIRUS 2 ([facility]) - Abnormal; Notable for the following components:      Result Value   SARS Coronavirus 2 POSITIVE (*)    All other components within normal limits  CBC WITH DIFFERENTIAL/PLATELET - Abnormal; Notable for the following  components:   Neutro Abs 9.0 (*)    Lymphs Abs 0.4 (*)    Abs Immature Granulocytes 0.13 (*)    All other components within normal limits  COMPREHENSIVE METABOLIC PANEL - Abnormal; Notable for the following components:   Potassium 3.3 (*)    CO2 21 (*)  Glucose, Bld 131 (*)    Calcium 8.1 (*)    Total Protein 5.9 (*)    Albumin 2.5 (*)    AST 236 (*)    ALT 263 (*)    Alkaline Phosphatase 198 (*)    Total Bilirubin 1.6 (*)    All other components within normal limits  D-DIMER, QUANTITATIVE (NOT AT [facility]) - Abnormal; Notable for the following components:   D-Dimer, Quant 2.14 (*)    All other components within normal limits  LACTATE DEHYDROGENASE - Abnormal; Notable for the following components:   LDH 446 (*)    All other components within normal limits  FERRITIN - Abnormal; Notable for the following components:   Ferritin 1,855 (*)    All other components within normal limits  FIBRINOGEN - Abnormal; Notable for the following components:   Fibrinogen >800 (*)    All other components within normal limits  C-REACTIVE PROTEIN - Abnormal; Notable for the following components:   CRP 18.7 (*)    All other components within normal limits  CULTURE, BLOOD (ROUTINE X 2)  CULTURE, BLOOD (ROUTINE X 2)  LACTIC ACID, PLASMA  PROCALCITONIN  TRIGLYCERIDES  LACTIC ACID, PLASMA  TROPONIN I (HIGH SENSITIVITY)  TROPONIN I (HIGH SENSITIVITY)    EKG None  Radiology Dg Chest Port 1 View  Result Date: 07/15/2019 CLINICAL DATA:  Shortness of breath EXAM: PORTABLE CHEST 1 VIEW COMPARISON:  08/10/2014 FINDINGS: Heart size is within normal limits. Calcific aortic knob. No focal airspace consolidation, pleural effusion, or pneumothorax. IMPRESSION: No acute cardiopulmonary findings. Electronically Signed   By: Davina Poke M.D.   On: 07/15/2019 11:55    Procedures .Critical Care Performed by: Domenic Moras, PA-C Authorized by: Domenic Moras, PA-C   Critical care provider statement:     Critical care time (minutes):  45   Critical care was time spent personally by me on the following activities:  Discussions with consultants, evaluation of patient's response to treatment, examination of patient, ordering and performing treatments and interventions, ordering and review of laboratory studies, ordering and review of radiographic studies, pulse oximetry, re-evaluation of patient's condition, obtaining history from patient or surrogate and review of old charts   (including critical care time)  Medications Ordered in ED Medications - No data to display   Initial Impression / Assessment and Plan / ED Course  I have reviewed the triage vital signs and the nursing notes.  Pertinent labs & imaging results that were available during my care of the patient were reviewed by me and considered in my medical decision making (see chart for details).        BP 131/77   Pulse 66   Temp 98.6 F (37 C) (Oral)   Resp (!) 30   Ht 5' 2" (1.575 m)   Wt 54.4 kg   SpO2 95%   BMI 21.95 kg/m    Final Clinical Impressions(s) / ED Diagnoses   Final diagnoses:  Acute respiratory disease due to COVID-19 virus    ED Discharge Orders    None     10:19 AM Patient sent here from urgent care center for evaluation of potential COVID-19 asked that he travel to [place] which is a current hotspot for COVID-19.  He does complain of pleuritic chest pain and shortness of breath for the past 5 days.  He did have a long distant travel.  Patient however denies having productive cough or loss of taste or smell.  Will screen for COVID-19, will order d-dimer  to assess for potential PE.  EKG and troponin ordered.  12:33 PM Patient has a documented temperature of 100.4 and documented hypoxia of 86% on room air.  His O2 sats improved with supplemental oxygen at 3L.  His COVID-19 test is currently pending however labs are remarkable for elevated inflammatory marker and liver function with AST 236, ALT 263, alk  phos 198, total bili of 1.6.  Elevated d-dimer of 2.14, LDH 446, ferritin 1855, fibrinogen greater than 800, CRP 18.7.  Chest x-ray without any acute finding.  2:15 PM Patient has positive for COVID-19.  I did discuss patient's care with his family members over the phone.  Will consult medicine for admission to [facility] for further management.  Patient is aware of finding and agrees with plan.  2:21 PM Appreciate consultation from [facility] Dr. Lorin Mercy who agrees to see pt and admit to [facility].   Gerrald Shin was evaluated in Emergency Department on 07/15/2019 for the symptoms described in the history of present illness. He was evaluated in the context of the global COVID-19 pandemic, which necessitated consideration that the patient might be at risk for infection with the SARS-CoV-2 virus that causes COVID-19. Institutional protocols and algorithms that pertain to the evaluation of patients at risk for COVID-19 are in a state of rapid change based on information released by regulatory bodies including the CDC and federal and state organizations. These policies and algorithms were followed during the patient's care in the ED.    Domenic Moras, PA-C 07/15/19 1422    Gareth Morgan, MD 07/17/19 1143

## 2019-07-15 NOTE — ED Notes (Signed)
ED TO INPATIENT HANDOFF REPORT  ED Nurse Name and Phone #: William Hamburger RN, [place] Name/Age/Gender Jim Tucker 78 y.o. male Room/Bed: 023C/023C  Code Status   Code Status: Full Code  Home/SNF/Other Home Patient oriented to: situation Is this baseline? No   Triage Complete: Triage complete  Chief Complaint poissible covid  Triage Note Pt BIB [facility] from [place] site d/t possible COVID. Pt just came back from [place] last week and has been experiencing SOB for x5 days   Allergies Allergies  Allergen Reactions  . Other Other (See Comments)     Raw Apples swells throat    Level of Care/Admitting Diagnosis ED Disposition    ED Disposition Condition [facility] Area: [place] [100101]  Level of Care: Telemetry [5]  Covid Evaluation: Confirmed COVID Positive  Diagnosis: Respiratory tract infection due to COVID-19 virus [[id number]]  Admitting Physician: Karmen Bongo [[id number]]  Attending Physician: Karmen Bongo [[id number]]  Estimated length of stay: 3 - 4 days  Certification:: I certify this patient will need inpatient services for at least 2 midnights  PT Class (Do Not Modify): Inpatient [101]  PT Acc Code (Do Not Modify): Private [1]       B Medical/Surgery History Past Medical History:  Diagnosis Date  . Arthritis    shoulders, back  . BPH (benign prostatic hypertrophy)    takes Proscar daily  . Diverticulosis   . History of colon polyps   . Hyperlipidemia    takes Atorvastatin daily  . Internal hemorrhoid   . LBP (low back pain)    HNP  . Nocturia   . Numbness and tingling    in right leg   Past Surgical History:  Procedure Laterality Date  . BACK SURGERY    . COLONOSCOPY    . CYSTOSCOPY WITH BIOPSY N/A 07/19/2013   Procedure: TRANSURETHRAL RESECTION OF BLADDER NECK;  Surgeon: Bernestine Amass, MD;  Location: [facility];  Service: Urology;  Laterality: N/A;  . LUMBAR LAMINECTOMY Right  08/12/2014   Procedure: Right L2-3 Hemilaminectomy, Removal HNP;  Surgeon: Marybelle Killings, MD;  Location: [facility];  Service: Orthopedics;  Laterality: Right;  . LUMBAR SPINE SURGERY     x 2  . NASAL SINUS SURGERY  40 yrs ago  . PROSTATE BIOPSY  2007, 2010   benign.  Urology - Dr. Ellwood Sayers  . ROTATOR CUFF REPAIR Bilateral    total of 3  . TRANSURETHRAL RESECTION OF PROSTATE  10-23-2009     A IV Location/Drains/Wounds Patient Lines/Drains/Airways Status   Active Line/Drains/Airways    Name:   Placement date:   Placement time:   Site:   Days:   Peripheral IV 07/15/19 Left Antecubital   07/15/19    0930    Antecubital   less than 1   Incision 07/19/13 Perineum Other (Comment)   07/19/13    1054     2187   Incision (Closed) 08/12/14 Back Right   08/12/14    1057     1798          Intake/Output Last 24 hours No intake or output data in the 24 hours ending 07/15/19 1533  Labs/Imaging Results for orders placed or performed during the hospital encounter of 07/15/19 (from the past 48 hour(s))  CBC WITH DIFFERENTIAL     Status: Abnormal   Collection Time: 07/15/19  9:51 AM  Result Value Ref Range   WBC 10.0 4.0 -  10.5 K/uL   RBC 4.85 4.22 - 5.81 MIL/uL   Hemoglobin 15.0 13.0 - 17.0 g/dL   HCT 45.5 39.0 - 52.0 %   MCV 93.8 80.0 - 100.0 fL   MCH 30.9 26.0 - 34.0 pg   MCHC 33.0 30.0 - 36.0 g/dL   RDW 14.6 11.5 - 15.5 %   Platelets 182 150 - 400 K/uL   nRBC 0.0 0.0 - 0.2 %   Neutrophils Relative % 90 %   Neutro Abs 9.0 (H) 1.7 - 7.7 K/uL   Lymphocytes Relative 4 %   Lymphs Abs 0.4 (L) 0.7 - 4.0 K/uL   Monocytes Relative 5 %   Monocytes Absolute 0.5 0.1 - 1.0 K/uL   Eosinophils Relative 0 %   Eosinophils Absolute 0.0 0.0 - 0.5 K/uL   Basophils Relative 0 %   Basophils Absolute 0.0 0.0 - 0.1 K/uL   Immature Granulocytes 1 %   Abs Immature Granulocytes 0.13 (H) 0.00 - 0.07 K/uL    Comment: Performed at [facility] [address] [place], [place] [address]  Comprehensive  metabolic panel     Status: Abnormal   Collection Time: 07/15/19  9:51 AM  Result Value Ref Range   Sodium 137 135 - 145 mmol/L   Potassium 3.3 (L) 3.5 - 5.1 mmol/L   Chloride 104 98 - 111 mmol/L   CO2 21 (L) 22 - 32 mmol/L   Glucose, Bld 131 (H) 70 - 99 mg/dL   BUN 19 8 - 23 mg/dL   Creatinine, Ser 0.69 0.61 - 1.24 mg/dL   Calcium 8.1 (L) 8.9 - 10.3 mg/dL   Total Protein 5.9 (L) 6.5 - 8.1 g/dL   Albumin 2.5 (L) 3.5 - 5.0 g/dL   AST 236 (H) 15 - 41 U/L   ALT 263 (H) 0 - 44 U/L   Alkaline Phosphatase 198 (H) 38 - 126 U/L   Total Bilirubin 1.6 (H) 0.3 - 1.2 mg/dL   GFR calc non Af Amer >60 >60 mL/min   GFR calc Af Amer >60 >60 mL/min   Anion gap 12 5 - 15    Comment: Performed at [facility], [place] [address], [place], [place] [address]  D-dimer, quantitative     Status: Abnormal   Collection Time: 07/15/19  9:51 AM  Result Value Ref Range   D-Dimer, Quant 2.14 (H) 0.00 - 0.50 ug/mL-FEU    Comment: (NOTE) At the manufacturer cut-off of 0.50 ug/mL FEU, this assay has been documented to exclude PE with a sensitivity and negative predictive value of 97 to 99%.  At this time, this assay has not been approved by the FDA to exclude DVT/VTE. Results should be correlated with clinical presentation. Performed at [facility], [place] [address], [place], [place] [address]   Procalcitonin     Status: None   Collection Time: 07/15/19  9:51 AM  Result Value Ref Range   Procalcitonin 0.16 ng/mL    Comment:        Interpretation: PCT (Procalcitonin) <= 0.5 ng/mL: Systemic infection (sepsis) is not likely. Local bacterial infection is possible. (NOTE)       Sepsis PCT Algorithm           Lower Respiratory Tract                                      Infection PCT Algorithm    ----------------------------     ----------------------------  PCT < 0.25 ng/mL                PCT < 0.10 ng/mL         Strongly encourage             Strongly discourage   discontinuation of  antibiotics    initiation of antibiotics    ----------------------------     -----------------------------       PCT 0.25 - 0.50 ng/mL            PCT 0.10 - 0.25 ng/mL               OR       >80% decrease in PCT            Discourage initiation of                                            antibiotics      Encourage discontinuation           of antibiotics    ----------------------------     -----------------------------         PCT >= 0.50 ng/mL              PCT 0.26 - 0.50 ng/mL               AND        <80% decrease in PCT             Encourage initiation of                                             antibiotics       Encourage continuation           of antibiotics    ----------------------------     -----------------------------        PCT >= 0.50 ng/mL                  PCT > 0.50 ng/mL               AND         increase in PCT                  Strongly encourage                                      initiation of antibiotics    Strongly encourage escalation           of antibiotics                                     -----------------------------                                           PCT <= 0.25 ng/mL  OR                                        > 80% decrease in PCT                                     Discontinue / Do not initiate                                             antibiotics Performed at [facility], [place] [address] [place], [place] [address]   Lactate dehydrogenase     Status: Abnormal   Collection Time: 07/15/19  9:51 AM  Result Value Ref Range   LDH 446 (H) 98 - 192 U/L    Comment: Performed at [facility], [place] [address], [place], [place] [address]  Ferritin     Status: Abnormal   Collection Time: 07/15/19  9:51 AM  Result Value Ref Range   Ferritin 1,855 (H) 24 - 336 ng/mL    Comment: Performed at [facility], [place] [address] [place], [place] [address]  Triglycerides     Status: None    Collection Time: 07/15/19  9:51 AM  Result Value Ref Range   Triglycerides 111 <150 mg/dL    Comment: Performed at [facility] [address], [place], [place] [address]  Fibrinogen     Status: Abnormal   Collection Time: 07/15/19  9:51 AM  Result Value Ref Range   Fibrinogen >800 (H) 210 - 475 mg/dL    Comment: Performed at [facility] [address] [place], [place] [address]  C-reactive protein     Status: Abnormal   Collection Time: 07/15/19  9:51 AM  Result Value Ref Range   CRP 18.7 (H) <1.0 mg/dL    Comment: Performed at [facility] [address], [place], [place] [address]  Lactic acid, plasma     Status: None   Collection Time: 07/15/19 10:37 AM  Result Value Ref Range   Lactic Acid, Venous 1.4 0.5 - 1.9 mmol/L    Comment: Performed at [facility] [address] [place], [place] [address]  Troponin I (High Sensitivity)     Status: None   Collection Time: 07/15/19 10:43 AM  Result Value Ref Range   Troponin I (High Sensitivity) 9 <18 ng/L    Comment: (NOTE) Elevated high sensitivity troponin I (hsTnI) values and significant  changes across serial measurements may suggest ACS but many other  chronic and acute conditions are known to elevate hsTnI results.  Refer to the "Links" section for chest pain algorithms and additional  guidance. Performed at [facility], [place] [address] [place], [place] [address]   SARS Coronavirus 2 [facility] order, Performed in [facility] hospital lab) Nasopharyngeal Nasopharyngeal Swab     Status: Abnormal   Collection Time: 07/15/19 10:45 AM   Specimen: Nasopharyngeal Swab  Result Value Ref Range   SARS Coronavirus 2 POSITIVE (A) NEGATIVE    Comment: RESULT CALLED TO, READ BACK BY AND VERIFIED WITH: Lowell Bouton RN 12:55 07/15/19 ([email or web address]) (NOTE) If result is NEGATIVE SARS-CoV-2 target nucleic acids are NOT DETECTED.  The SARS-CoV-2 RNA is generally detectable in upper and lower  respiratory specimens  during the acute phase of infection. The lowest  concentration of SARS-CoV-2 viral copies this assay can detect is 250  copies / mL. A negative result does not preclude SARS-CoV-2 infection  and should not be used as the sole basis for treatment or other  patient management decisions.  A negative result may occur with  improper specimen collection / handling, submission of specimen other  than nasopharyngeal swab, presence of viral mutation(s) within the  areas targeted by this assay, and inadequate number of viral copies  (<250 copies / mL). A negative result must be combined with clinical  observations, patient history, and epidemiological information. If result is POSITIVE SARS-CoV-2 target nucleic acids are DETECTED. The  SARS-CoV-2 RNA is generally detectable in upper and lower  respiratory specimens during the acute phase of infection.  Positive  results are indicative of active infection with SARS-CoV-2.  Clinical  correlation with patient history and other diagnostic information is  necessary to determine patient infection status.  Positive results do  not rule out bacterial infection or co-infection with other viruses. If result is PRESUMPTIVE POSTIVE SARS-CoV-2 nucleic acids MAY BE PRESENT.   A presumptive positive result was obtained on the submitted specimen  and confirmed on repeat testing.  While 2019 novel coronavirus  (SARS-CoV-2) nucleic acids may be present in the submitted sample  additional confirmatory testing may be necessary for epidemiological  and / or clinical management purposes  to differentiate between  SARS-CoV-2 and other Sarbecovirus currently known to infect humans.  If clinically indicated additional testing with an alternate test  methodology [phone]) is a dvised. The SARS-CoV-2 RNA is generally  detectable in upper and lower respiratory specimens during the acute  phase of infection. The expected result is Negative. Fact Sheet for Patients:   StrictlyIdeas.no Fact Sheet for Healthcare Providers: BankingDealers.co.za This test is not yet approved or cleared by the [place] FDA and has been authorized for detection and/or diagnosis of SARS-CoV-2 by FDA under an Emergency Use Authorization (EUA).  This EUA will remain in effect (meaning this test can be used) for the duration of the COVID-19 declaration under Section 564(b)(1) of the Act, 21 U.S.C. section 360bbb-3(b)(1), unless the authorization is terminated or revoked sooner. Performed at [facility], [place] [address], [place], [place] [address]   Troponin I (High Sensitivity)     Status: None   Collection Time: 07/15/19  1:18 PM  Result Value Ref Range   Troponin I (High Sensitivity) 8 <18 ng/L    Comment: (NOTE) Elevated high sensitivity troponin I (hsTnI) values and significant  changes across serial measurements may suggest ACS but many other  chronic and acute conditions are known to elevate hsTnI results.  Refer to the "Links" section for chest pain algorithms and additional  guidance. Performed at [facility], [place] [address], [place], [place] [address]    Dg Chest Port 1 View  Result Date: 07/15/2019 CLINICAL DATA:  Shortness of breath EXAM: PORTABLE CHEST 1 VIEW COMPARISON:  08/10/2014 FINDINGS: Heart size is within normal limits. Calcific aortic knob. No focal airspace consolidation, pleural effusion, or pneumothorax. IMPRESSION: No acute cardiopulmonary findings. Electronically Signed   By: Davina Poke M.D.   On: 07/15/2019 11:55    Pending Labs Unresulted Labs (From admission, onward)    Start     Ordered   07/16/19 0500  CBC with Differential/Platelet  Daily,  R     07/15/19 1517   07/16/19 0500  Comprehensive metabolic panel  Daily,   R     07/15/19 1517   07/16/19 0500  C-reactive protein  Daily,   R     07/15/19 1517   07/16/19 0500  D-dimer, quantitative (not at [facility])  Daily,   R      07/15/19 1517   07/16/19 0500  Ferritin  Daily,   R     07/15/19 1517   07/16/19 0500  Magnesium  Daily,   R     07/15/19 1517   07/16/19 0500  Phosphorus  Daily,   R     07/15/19 1517   07/15/19 0951  Blood Culture (routine x 2)  BLOOD CULTURE X 2,   STAT     07/15/19 0951          Vitals/Pain Today's Vitals   07/15/19 1330 07/15/19 1345 07/15/19 1522 07/15/19 1523  BP: 130/70 131/77 (!) 142/73   Pulse: 64 66 (!) 58   Resp: (!) 31 (!) 30 (!) 22   Temp:      TempSrc:      SpO2: 94% 95% 94%   Weight:      Height:      PainSc:    0-No pain    Isolation Precautions Airborne and Contact precautions  Medications Medications  aspirin EC tablet 81 mg (has no administration in time range)  atorvastatin (LIPITOR) tablet 40 mg (has no administration in time range)  hydrochlorothiazide (HYDRODIURIL) tablet 25 mg (has no administration in time range)  finasteride (PROSCAR) tablet 5 mg (has no administration in time range)  enoxaparin (LOVENOX) injection 40 mg (has no administration in time range)  sodium chloride flush (NS) 0.9 % injection 3 mL (has no administration in time range)  sodium chloride flush (NS) 0.9 % injection 3 mL (has no administration in time range)  0.9 %  sodium chloride infusion (has no administration in time range)  acetaminophen (TYLENOL) tablet 650 mg (has no administration in time range)  oxyCODONE (Oxy IR/ROXICODONE) immediate release tablet 5 mg (has no administration in time range)  zolpidem (AMBIEN) tablet 5 mg (has no administration in time range)  docusate sodium (COLACE) capsule 100 mg (has no administration in time range)  polyethylene glycol (MIRALAX / GLYCOLAX) packet 17 g (has no administration in time range)  bisacodyl (DULCOLAX) EC tablet 5 mg (has no administration in time range)  ondansetron (ZOFRAN) tablet 4 mg (has no administration in time range)    Or  ondansetron (ZOFRAN) injection 4 mg (has no administration in time range)  sodium  chloride flush (NS) 0.9 % injection 3 mL (has no administration in time range)  methylPREDNISolone sodium succinate (SOLU-MEDROL) 40 mg/mL injection 40 mg (has no administration in time range)    Mobility walks     Focused Assessments Pulmonary Assessment Handoff:  Lung sounds: Bilateral Breath Sounds: Diminished L Breath Sounds: Diminished R Breath Sounds: Diminished O2 Device: Nasal Cannula O2 Flow Rate (L/min): 3 L/min      R Recommendations: See Admitting Provider Note  Report given to:   Additional Notes:

## 2019-07-15 NOTE — ED Triage Notes (Addendum)
Pt BIB [facility] from [facility] site d/t possible COVID. Pt just came back from [place] last week and has been experiencing SOB for x5 days

## 2019-07-15 NOTE — ED Notes (Signed)
Wife updated; pt en route to [facility].

## 2019-07-16 ENCOUNTER — Inpatient Hospital Stay (HOSPITAL_COMMUNITY): Payer: Medicare Other

## 2019-07-16 DIAGNOSIS — E782 Mixed hyperlipidemia: Secondary | ICD-10-CM

## 2019-07-16 DIAGNOSIS — N400 Enlarged prostate without lower urinary tract symptoms: Secondary | ICD-10-CM

## 2019-07-16 DIAGNOSIS — R7303 Prediabetes: Secondary | ICD-10-CM

## 2019-07-16 LAB — CBC WITH DIFFERENTIAL/PLATELET
Abs Immature Granulocytes: 0.05 10*3/uL (ref 0.00–0.07)
Basophils Absolute: 0 10*3/uL (ref 0.0–0.1)
Basophils Relative: 0 %
Eosinophils Absolute: 0 10*3/uL (ref 0.0–0.5)
Eosinophils Relative: 0 %
HCT: 40.9 % (ref 39.0–52.0)
Hemoglobin: 13.6 g/dL (ref 13.0–17.0)
Immature Granulocytes: 1 %
Lymphocytes Relative: 5 %
Lymphs Abs: 0.3 10*3/uL — ABNORMAL LOW (ref 0.7–4.0)
MCH: 30.3 pg (ref 26.0–34.0)
MCHC: 33.3 g/dL (ref 30.0–36.0)
MCV: 91.1 fL (ref 80.0–100.0)
Monocytes Absolute: 0.2 10*3/uL (ref 0.1–1.0)
Monocytes Relative: 3 %
Neutro Abs: 5.9 10*3/uL (ref 1.7–7.7)
Neutrophils Relative %: 91 %
Platelets: 166 10*3/uL (ref 150–400)
RBC: 4.49 MIL/uL (ref 4.22–5.81)
RDW: 14.3 % (ref 11.5–15.5)
WBC: 6.5 10*3/uL (ref 4.0–10.5)
nRBC: 0 % (ref 0.0–0.2)

## 2019-07-16 LAB — HEPATIC FUNCTION PANEL
ALT: 201 U/L — ABNORMAL HIGH (ref 0–44)
AST: 110 U/L — ABNORMAL HIGH (ref 15–41)
Albumin: 2.4 g/dL — ABNORMAL LOW (ref 3.5–5.0)
Alkaline Phosphatase: 200 U/L — ABNORMAL HIGH (ref 38–126)
Bilirubin, Direct: 0.3 mg/dL — ABNORMAL HIGH (ref 0.0–0.2)
Indirect Bilirubin: 0.8 mg/dL (ref 0.3–0.9)
Total Bilirubin: 1.1 mg/dL (ref 0.3–1.2)
Total Protein: 5.8 g/dL — ABNORMAL LOW (ref 6.5–8.1)

## 2019-07-16 LAB — PHOSPHORUS: Phosphorus: 3 mg/dL (ref 2.5–4.6)

## 2019-07-16 LAB — COMPREHENSIVE METABOLIC PANEL
ALT: 230 U/L — ABNORMAL HIGH (ref 0–44)
AST: 158 U/L — ABNORMAL HIGH (ref 15–41)
Albumin: 2.4 g/dL — ABNORMAL LOW (ref 3.5–5.0)
Alkaline Phosphatase: 212 U/L — ABNORMAL HIGH (ref 38–126)
Anion gap: 9 (ref 5–15)
BUN: 23 mg/dL (ref 8–23)
CO2: 21 mmol/L — ABNORMAL LOW (ref 22–32)
Calcium: 8.3 mg/dL — ABNORMAL LOW (ref 8.9–10.3)
Chloride: 107 mmol/L (ref 98–111)
Creatinine, Ser: 0.59 mg/dL — ABNORMAL LOW (ref 0.61–1.24)
GFR calc Af Amer: >60 mL/min (ref >60)
GFR calc non Af Amer: >60 mL/min (ref >60)
Glucose, Bld: 198 mg/dL — ABNORMAL HIGH (ref 70–99)
Potassium: 3.6 mmol/L (ref 3.5–5.1)
Sodium: 137 mmol/L (ref 135–145)
Total Bilirubin: 1.4 mg/dL — ABNORMAL HIGH (ref 0.3–1.2)
Total Protein: 5.9 g/dL — ABNORMAL LOW (ref 6.5–8.1)

## 2019-07-16 LAB — FERRITIN: Ferritin: 1103 ng/mL — ABNORMAL HIGH (ref 24–336)

## 2019-07-16 LAB — GLUCOSE, CAPILLARY
Glucose-Capillary: 199 mg/dL — ABNORMAL HIGH (ref 70–99)
Glucose-Capillary: 230 mg/dL — ABNORMAL HIGH (ref 70–99)

## 2019-07-16 LAB — D-DIMER, QUANTITATIVE: D-Dimer, Quant: 1.27 ug/mL-FEU — ABNORMAL HIGH (ref 0.00–0.50)

## 2019-07-16 LAB — C-REACTIVE PROTEIN: CRP: 19.9 mg/dL — ABNORMAL HIGH (ref <1.0)

## 2019-07-16 LAB — MAGNESIUM: Magnesium: 2.2 mg/dL (ref 1.7–2.4)

## 2019-07-16 LAB — HEMOGLOBIN A1C
Hgb A1c MFr Bld: 6.3 % — ABNORMAL HIGH (ref 4.8–5.6)
Mean Plasma Glucose: 134.11 mg/dL

## 2019-07-16 MED ORDER — SODIUM CHLORIDE 0.9 % IV SOLN
100.0000 mg | INTRAVENOUS | Status: DC
  Administered 2019-07-17: 18:00:00 100 mg via INTRAVENOUS
  Filled 2019-07-16: qty 20

## 2019-07-16 MED ORDER — INSULIN ASPART 100 UNIT/ML ~~LOC~~ SOLN
0.0000 [IU] | Freq: Three times a day (TID) | SUBCUTANEOUS | Status: DC
  Administered 2019-07-16: 2 [IU] via SUBCUTANEOUS
  Administered 2019-07-16 – 2019-07-17 (×2): 5 [IU] via SUBCUTANEOUS
  Administered 2019-07-17 (×2): 2 [IU] via SUBCUTANEOUS
  Administered 2019-07-18 – 2019-07-19 (×4): 5 [IU] via SUBCUTANEOUS
  Administered 2019-07-19: 09:00:00 2 [IU] via SUBCUTANEOUS
  Administered 2019-07-20: 08:00:00 1 [IU] via SUBCUTANEOUS
  Administered 2019-07-20: 12:00:00 2 [IU] via SUBCUTANEOUS
  Administered 2019-07-20: 17:00:00 7 [IU] via SUBCUTANEOUS
  Administered 2019-07-21: 3 [IU] via SUBCUTANEOUS
  Administered 2019-07-21: 13:00:00 7 [IU] via SUBCUTANEOUS

## 2019-07-16 MED ORDER — INSULIN ASPART 100 UNIT/ML ~~LOC~~ SOLN
0.0000 [IU] | Freq: Every day | SUBCUTANEOUS | Status: DC
  Administered 2019-07-16 – 2019-07-18 (×3): 2 [IU] via SUBCUTANEOUS
  Administered 2019-07-20: 22:00:00 4 [IU] via SUBCUTANEOUS
  Administered 2019-07-21: 3 [IU] via SUBCUTANEOUS

## 2019-07-16 MED ORDER — SODIUM CHLORIDE 0.9 % IV SOLN
200.0000 mg | Freq: Once | INTRAVENOUS | Status: AC
  Administered 2019-07-16: 18:00:00 200 mg via INTRAVENOUS
  Filled 2019-07-16: qty 40

## 2019-07-16 MED ORDER — IOHEXOL 350 MG/ML SOLN
100.0000 mL | Freq: Once | INTRAVENOUS | Status: AC | PRN
  Administered 2019-07-16: 13:00:00 75 mL via INTRAVENOUS

## 2019-07-16 NOTE — Progress Notes (Signed)
Pt was admitted for COVID. CXR was initially neg but CT is positive for LRTI. His baseline ALT was markedly elevated but it's not <220. He is still on O2. We will hold his atorvastatin and start remdesivir.  Remdesivir 200mg  IV x1 then 100mg  IV qday x4  Onnie Boer, PharmD, BCIDP, AAHIVP, CPP Infectious Disease Pharmacist 07/16/2019 5:03 PM

## 2019-07-16 NOTE — Progress Notes (Signed)
PROGRESS NOTE  [place]  [id number] DOB: 08/28/1941 DOA: 07/15/2019 PCP: Gregor Hams, MD   Brief Narrative: Jim Tucker is a 78 y.o. male with a history of HLD, prediabetes, HTN, BPH and orthopedic surgeries who presented to the ED 8/6 with muscle aches and progressive shortness of breath since 8/1, the day he arrived by plane from [place]. This was associated with poor appetite. On evaluation he was hypoxic and afebrile with negative CXR. Inflammatory markers were grossly elevated (CRP 18.7, ferritin 1,855, LDH 446, fibrinogen >800, and d-dimer 2.14), and SARS-CoV-2 swab was positive. LFTs were elevated (AST 236, ALT 263, TBili 1.6, Alk Phos 198). Steroids were started and the patient was admitted to [facility]. Hypoxia continues, LFTs have improved somewhat. CTA chest is pending.   Assessment & Plan: Principal Problem:   Respiratory tract infection due to COVID-19 virus Active Problems:   Hyperlipidemia   BPH (benign prostatic hyperplasia)   HTN (hypertension)   Prediabetes  Acute hypoxic respiratory failure due to covid-19 infection: No definite infiltrate on portable CXR, though time course of illness (entering 2nd week) in conjunction with advanced age, grossly elevated inflammatory markers and hypoxia, I very strongly suspect he has pneumonia. Having also had long distance plane travel recently and elevated d-dimer, also need to consider PE as well.  - Check CTA chest.  - Continue IV steroids. Not currently candidate for remdesivir due to ALT >5x ULN, recheck this PM and start if ALT 220 or below. - Continue airborne, contact precautions. PPE including surgical gown, gloves, cap, shoe covers, and CAPR used during this encounter in a negative pressure room.  - Check daily labs: CBC w/diff, CMP, d-dimer, ferritin, CRP - Enoxaparin prophylactic dose, '40mg'$  q24h. - Blood cultures drawn.  - Maintain euvolemia/net negative.  - Avoid NSAIDs - Recommend proning and aggressive use of  incentive spirometry.  BPH: s/p TURP - Continue finasteride  HLD:  - Continue statin  Prediabetes: Anticipate steroid-induced hyperglycemia.  - Sensitive SSI - Check HbA1c.  DVT prophylaxis: Lovenox Code Status: Full Family Communication: Daughter by phone Disposition Plan: Uncertain.   Consultants:   None  Procedures:   None  Antimicrobials:  None   Subjective: Shortness of breath is moderate after taking a shower. No chest pain, denies cough. No fever. Muscle aches about stable.  Objective: Vitals:   07/15/19 2015 07/16/19 0425 07/16/19 0725 07/16/19 0950  BP:  108/62 120/64   Pulse:  (!) 51 (!) 54   Resp: (!) 27 (!) 21 (!) 22   Temp:  (!) 97.5 F (36.4 C) 97.7 F (36.5 C)   TempSrc:  Oral Oral   SpO2: 97% 93% 92% 95%  Weight:      Height:        Intake/Output Summary (Last 24 hours) at 07/16/2019 1112 Last data filed at 07/16/2019 0908 Gross per 24 hour  Intake 360 ml  Output -  Net 360 ml   Filed Weights   07/15/19 0932  Weight: 54.4 kg    Gen: 78 y.o. male in no distress, appearing younger than stated age. Pulm: Tachypneic with supplemental oxygen. Clear to auscultation bilaterally.  CV: Regular rate and rhythm. No murmur, rub, or gallop. No JVD, no pedal edema. GI: Abdomen soft, non-tender, non-distended, with normoactive bowel sounds. No organomegaly or masses felt. Ext: Warm, no deformities Skin: No rashes, lesions no ulcers Neuro: Alert and oriented. No focal neurological deficits. Psych: Judgement and insight appear normal. Mood & affect appropriate.   Data Reviewed: I  have personally reviewed following labs and imaging studies  CBC: Recent Labs  Lab 07/15/19 0951 07/16/19 0445  WBC 10.0 6.5  NEUTROABS 9.0* 5.9  HGB 15.0 13.6  HCT 45.5 40.9  MCV 93.8 91.1  PLT 182 846   Basic Metabolic Panel: Recent Labs  Lab 07/15/19 0951 07/16/19 0445  NA 137 137  K 3.3* 3.6  CL 104 107  CO2 21* 21*  GLUCOSE 131* 198*  BUN 19 23   CREATININE 0.69 0.59*  CALCIUM 8.1* 8.3*  MG  --  2.2  PHOS  --  3.0   GFR: Estimated Creatinine Clearance: 58.6 mL/min (A) (by C-G formula based on SCr of 0.59 mg/dL (L)). Liver Function Tests: Recent Labs  Lab 07/15/19 0951 07/16/19 0445  AST 236* 158*  ALT 263* 230*  ALKPHOS 198* 212*  BILITOT 1.6* 1.4*  PROT 5.9* 5.9*  ALBUMIN 2.5* 2.4*   No results for input(s): LIPASE, AMYLASE in the last 168 hours. No results for input(s): AMMONIA in the last 168 hours. Coagulation Profile: No results for input(s): INR, PROTIME in the last 168 hours. Cardiac Enzymes: No results for input(s): CKTOTAL, CKMB, CKMBINDEX, TROPONINI in the last 168 hours. BNP (last 3 results) No results for input(s): PROBNP in the last 8760 hours. HbA1C: No results for input(s): HGBA1C in the last 72 hours. CBG: No results for input(s): GLUCAP in the last 168 hours. Lipid Profile: Recent Labs    07/15/19 0951  TRIG 111   Thyroid Function Tests: No results for input(s): TSH, T4TOTAL, FREET4, T3FREE, THYROIDAB in the last 72 hours. Anemia Panel: Recent Labs    07/15/19 0951 07/16/19 0445  FERRITIN 1,855* 1,103*   Urine analysis:    Component Value Date/Time   BILIRUBINUR negative 10/13/2012 1010   PROTEINUR negative 10/13/2012 1010   UROBILINOGEN 0.2 10/13/2012 1010   NITRITE negative 10/13/2012 1010   LEUKOCYTESUR Negative 10/13/2012 1010   Recent Results (from the past 240 hour(s))  Blood Culture (routine x 2)     Status: None (Preliminary result)   Collection Time: 07/15/19 10:42 AM   Specimen: BLOOD  Result Value Ref Range Status   Specimen Description BLOOD RIGHT ANTECUBITAL  Final   Special Requests   Final    BOTTLES DRAWN AEROBIC AND ANAEROBIC Blood Culture results may not be optimal due to an excessive volume of blood received in culture bottles   Culture   Final    NO GROWTH < 24 HOURS Performed at [facility], [place] [address], [place], [place] [address]    Report  Status PENDING  Incomplete  Blood Culture (routine x 2)     Status: None (Preliminary result)   Collection Time: 07/15/19 10:43 AM   Specimen: BLOOD RIGHT HAND  Result Value Ref Range Status   Specimen Description BLOOD RIGHT HAND  Final   Special Requests   Final    BOTTLES DRAWN AEROBIC AND ANAEROBIC Blood Culture results may not be optimal due to an excessive volume of blood received in culture bottles   Culture   Final    NO GROWTH < 24 HOURS Performed at [facility], [place] [address] [place], [place] [address]    Report Status PENDING  Incomplete  SARS Coronavirus 2 [facility] order, Performed in [facility] hospital lab) Nasopharyngeal Nasopharyngeal Swab     Status: Abnormal   Collection Time: 07/15/19 10:45 AM   Specimen: Nasopharyngeal Swab  Result Value Ref Range Status   SARS Coronavirus 2 POSITIVE (  A) NEGATIVE Final    Comment: RESULT CALLED TO, READ BACK BY AND VERIFIED WITH: Lowell Bouton RN 12:55 07/15/19 ([email or web address]) (NOTE) If result is NEGATIVE SARS-CoV-2 target nucleic acids are NOT DETECTED. The SARS-CoV-2 RNA is generally detectable in upper and lower  respiratory specimens during the acute phase of infection. The lowest  concentration of SARS-CoV-2 viral copies this assay can detect is 250  copies / mL. A negative result does not preclude SARS-CoV-2 infection  and should not be used as the sole basis for treatment or other  patient management decisions.  A negative result may occur with  improper specimen collection / handling, submission of specimen other  than nasopharyngeal swab, presence of viral mutation(s) within the  areas targeted by this assay, and inadequate number of viral copies  (<250 copies / mL). A negative result must be combined with clinical  observations, patient history, and epidemiological information. If result is POSITIVE SARS-CoV-2 target nucleic acids are DETECTED. The  SARS-CoV-2 RNA is generally detectable in upper and lower  respiratory  specimens during the acute phase of infection.  Positive  results are indicative of active infection with SARS-CoV-2.  Clinical  correlation with patient history and other diagnostic information is  necessary to determine patient infection status.  Positive results do  not rule out bacterial infection or co-infection with other viruses. If result is PRESUMPTIVE POSTIVE SARS-CoV-2 nucleic acids MAY BE PRESENT.   A presumptive positive result was obtained on the submitted specimen  and confirmed on repeat testing.  While 2019 novel coronavirus  (SARS-CoV-2) nucleic acids may be present in the submitted sample  additional confirmatory testing may be necessary for epidemiological  and / or clinical management purposes  to differentiate between  SARS-CoV-2 and other Sarbecovirus currently known to infect humans.  If clinically indicated additional testing with an alternate test  methodology [phone]) is a dvised. The SARS-CoV-2 RNA is generally  detectable in upper and lower respiratory specimens during the acute  phase of infection. The expected result is Negative. Fact Sheet for Patients:  StrictlyIdeas.no Fact Sheet for Healthcare Providers: BankingDealers.co.za This test is not yet approved or cleared by the [place] FDA and has been authorized for detection and/or diagnosis of SARS-CoV-2 by FDA under an Emergency Use Authorization (EUA).  This EUA will remain in effect (meaning this test can be used) for the duration of the COVID-19 declaration under Section 564(b)(1) of the Act, 21 U.S.C. section 360bbb-3(b)(1), unless the authorization is terminated or revoked sooner. Performed at [facility], [place] [address], [place], [place] [address]       Radiology Studies: Dg Chest Port 1 View  Result Date: 07/15/2019 CLINICAL DATA:  Shortness of breath EXAM: PORTABLE CHEST 1 VIEW COMPARISON:  08/10/2014 FINDINGS: Heart size is within  normal limits. Calcific aortic knob. No focal airspace consolidation, pleural effusion, or pneumothorax. IMPRESSION: No acute cardiopulmonary findings. Electronically Signed   By: Davina Poke M.D.   On: 07/15/2019 11:55    Scheduled Meds: . aspirin  81 mg Oral Daily  . atorvastatin  40 mg Oral Daily  . docusate sodium  100 mg Oral BID  . enoxaparin (LOVENOX) injection  40 mg Subcutaneous Q24H  . finasteride  5 mg Oral Daily  . methylPREDNISolone (SOLU-MEDROL) injection  40 mg Intravenous Q12H  . sodium chloride flush  3 mL Intravenous Q12H  . sodium chloride flush  3 mL Intravenous Q12H   Continuous Infusions: . sodium chloride  LOS: 1 day   Time spent: 35 minutes.  Patrecia Pour, MD [facility] [email or web address] Password [email or web address] 07/16/2019, 11:12 AM

## 2019-07-17 LAB — CBC WITH DIFFERENTIAL/PLATELET
Abs Immature Granulocytes: 0.11 10*3/uL — ABNORMAL HIGH (ref 0.00–0.07)
Basophils Absolute: 0 10*3/uL (ref 0.0–0.1)
Basophils Relative: 0 %
Eosinophils Absolute: 0 10*3/uL (ref 0.0–0.5)
Eosinophils Relative: 0 %
HCT: 37.9 % — ABNORMAL LOW (ref 39.0–52.0)
Hemoglobin: 12.7 g/dL — ABNORMAL LOW (ref 13.0–17.0)
Immature Granulocytes: 1 %
Lymphocytes Relative: 3 %
Lymphs Abs: 0.5 10*3/uL — ABNORMAL LOW (ref 0.7–4.0)
MCH: 30.5 pg (ref 26.0–34.0)
MCHC: 33.5 g/dL (ref 30.0–36.0)
MCV: 90.9 fL (ref 80.0–100.0)
Monocytes Absolute: 0.6 10*3/uL (ref 0.1–1.0)
Monocytes Relative: 4 %
Neutro Abs: 14 10*3/uL — ABNORMAL HIGH (ref 1.7–7.7)
Neutrophils Relative %: 92 %
Platelets: 186 10*3/uL (ref 150–400)
RBC: 4.17 MIL/uL — ABNORMAL LOW (ref 4.22–5.81)
RDW: 14.2 % (ref 11.5–15.5)
WBC: 15.3 10*3/uL — ABNORMAL HIGH (ref 4.0–10.5)
nRBC: 0 % (ref 0.0–0.2)

## 2019-07-17 LAB — COMPREHENSIVE METABOLIC PANEL
ALT: 205 U/L — ABNORMAL HIGH (ref 0–44)
AST: 108 U/L — ABNORMAL HIGH (ref 15–41)
Albumin: 2.2 g/dL — ABNORMAL LOW (ref 3.5–5.0)
Alkaline Phosphatase: 171 U/L — ABNORMAL HIGH (ref 38–126)
Anion gap: 7 (ref 5–15)
BUN: 35 mg/dL — ABNORMAL HIGH (ref 8–23)
CO2: 22 mmol/L (ref 22–32)
Calcium: 8.3 mg/dL — ABNORMAL LOW (ref 8.9–10.3)
Chloride: 109 mmol/L (ref 98–111)
Creatinine, Ser: 0.62 mg/dL (ref 0.61–1.24)
GFR calc Af Amer: >60 mL/min (ref >60)
GFR calc non Af Amer: >60 mL/min (ref >60)
Glucose, Bld: 222 mg/dL — ABNORMAL HIGH (ref 70–99)
Potassium: 3.6 mmol/L (ref 3.5–5.1)
Sodium: 138 mmol/L (ref 135–145)
Total Bilirubin: 0.4 mg/dL (ref 0.3–1.2)
Total Protein: 5.2 g/dL — ABNORMAL LOW (ref 6.5–8.1)

## 2019-07-17 LAB — D-DIMER, QUANTITATIVE: D-Dimer, Quant: 1.1 ug/mL-FEU — ABNORMAL HIGH (ref 0.00–0.50)

## 2019-07-17 LAB — C-REACTIVE PROTEIN: CRP: 11.7 mg/dL — ABNORMAL HIGH (ref <1.0)

## 2019-07-17 LAB — GLUCOSE, CAPILLARY
Glucose-Capillary: 152 mg/dL — ABNORMAL HIGH (ref 70–99)
Glucose-Capillary: 292 mg/dL — ABNORMAL HIGH (ref 70–99)
Glucose-Capillary: 185 mg/dL — ABNORMAL HIGH (ref 70–99)
Glucose-Capillary: 245 mg/dL — ABNORMAL HIGH (ref 70–99)

## 2019-07-17 LAB — FERRITIN: Ferritin: 960 ng/mL — ABNORMAL HIGH (ref 24–336)

## 2019-07-17 MED ORDER — PHENOL 1.4 % MT LIQD
1.0000 | OROMUCOSAL | Status: DC | PRN
  Administered 2019-07-17 (×2): 1 via OROMUCOSAL
  Filled 2019-07-17: qty 177

## 2019-07-17 NOTE — Progress Notes (Signed)
pt has long periods of HR 45-55. Bp is 133/73 and he is asympamatic notified MD Bonner Puna. No acute distress will continue to monitor

## 2019-07-17 NOTE — Progress Notes (Signed)
PROGRESS NOTE  [place]  [id number] DOB: 1941/09/17 DOA: 07/15/2019 PCP: Gregor Hams, MD   Brief Narrative: Jim Tucker is a 78 y.o. male with a history of HLD, prediabetes, HTN, BPH and orthopedic surgeries who presented to the ED 8/6 with muscle aches and progressive shortness of breath since 8/1, the day he arrived by plane from [place]. This was associated with poor appetite. On evaluation he was hypoxic and afebrile with negative CXR. Inflammatory markers were grossly elevated (CRP 18.7, ferritin 1,855, LDH 446, fibrinogen >800, and d-dimer 2.14), and SARS-CoV-2 swab was positive. LFTs were elevated (AST 236, ALT 263, TBili 1.6, Alk Phos 198). Steroids were started and the patient was admitted to [facility]. Hypoxia continued with GGOs on CTA of the chest which also ruled out PE. After LFTs showed sustained improvement, remdesivir was started 8/7.Marland Kitchen  Assessment & Plan: Principal Problem:   Respiratory tract infection due to COVID-19 virus Active Problems:   Hyperlipidemia   BPH (benign prostatic hyperplasia)   HTN (hypertension)   Prediabetes  Acute hypoxic respiratory failure due to covid-19 pneumonia: Developed widespread GGOs on CT chest 24 hours after negative CXR with severely elevated inflammatory markers (CRP 19), entering 2nd week of illness. Hypoxia is stable and CRP coming down with steroids and initiation of remdesivir. No PE on CTA chest.  - Continue IV steroids  - Continue remdesivir (8/7 - 8/11)  - Continue airborne, contact precautions. PPE including surgical gown, gloves, cap, shoe covers, and CAPR used during this encounter in a negative pressure room.  - Check daily labs: CBC w/diff, CMP, d-dimer, ferritin, CRP - Enoxaparin prophylactic dose, '40mg'$  q24h. - Blood cultures drawn.  - Maintain euvolemia/net negative.  - Avoid NSAIDs - Recommend proning and aggressive use of incentive spirometry.  LFT elevation: Improving.  - Hold statin   BPH: s/p TURP -  Continue finasteride  HLD:  - Hold statin with LFT elevation  Prediabetes: HbA1c 6.3%. Anticipate steroid-induced hyperglycemia.  - Continue sensitive SSI for tight glucose control  DVT prophylaxis: Lovenox Code Status: Full Family Communication: Daughter by phone. Also called son, with whom the patient requests all future updates are given. His son speaks Spanish as well as [place] and is able to update the patient's Spanish-speaking wife.  Disposition Plan: Uncertain.   Consultants:   None  Procedures:   None  Antimicrobials:  Remdesivir 8/7 >> 8/11  Subjective: Feels a little bit better today. Was still short of breath when getting to the bathroom, but no chest pain. Feels weak but no muscle aches.  Objective: Vitals:   07/16/19 1545 07/16/19 1945 07/16/19 2100 07/17/19 0400  BP: (!) 143/72  (!) 151/76 (!) 127/48  Pulse: 74  65   Resp: (!) 23  (!) 22   Temp: 97.8 F (36.6 C) 97.9 F (36.6 C) (!) 97.4 F (36.3 C) 97.9 F (36.6 C)  TempSrc: Oral Oral Oral Oral  SpO2: 95%  95%   Weight:      Height:        Intake/Output Summary (Last 24 hours) at 07/17/2019 0807 Last data filed at 07/16/2019 2100 Gross per 24 hour  Intake 1301.92 ml  Output 201 ml  Net 1100.92 ml   Filed Weights   07/15/19 0932  Weight: 54.4 kg   Gen: 78 y.o. male in no distress Pulm: Tachypneic with supplemental oxygen. Diffuse bilateral crackles. CV: Regular rate and rhythm. No murmur, rub, or gallop. No JVD, no dependent edema. GI: Abdomen soft, non-tender, non-distended, with normoactive  bowel sounds.  Ext: Warm, no deformities Skin: No rashes, lesions or ulcers on visualized skin. Neuro: Alert and oriented. No focal neurological deficits. Psych: Judgement and insight appear fair. Mood euthymic & affect congruent. Behavior is appropriate.    Data Reviewed: I have personally reviewed following labs and imaging studies  CBC: Recent Labs  Lab 07/15/19 0951 07/16/19 0445 07/17/19  0235  WBC 10.0 6.5 15.3*  NEUTROABS 9.0* 5.9 14.0*  HGB 15.0 13.6 12.7*  HCT 45.5 40.9 37.9*  MCV 93.8 91.1 90.9  PLT 182 166 240   Basic Metabolic Panel: Recent Labs  Lab 07/15/19 0951 07/16/19 0445 07/17/19 0235  NA 137 137 138  K 3.3* 3.6 3.6  CL 104 107 109  CO2 21* 21* 22  GLUCOSE 131* 198* 222*  BUN 19 23 35*  CREATININE 0.69 0.59* 0.62  CALCIUM 8.1* 8.3* 8.3*  MG  --  2.2  --   PHOS  --  3.0  --    GFR: Estimated Creatinine Clearance: 58.6 mL/min (by C-G formula based on SCr of 0.62 mg/dL). Liver Function Tests: Recent Labs  Lab 07/15/19 0951 07/16/19 0445 07/16/19 1513 07/17/19 0235  AST 236* 158* 110* 108*  ALT 263* 230* 201* 205*  ALKPHOS 198* 212* 200* 171*  BILITOT 1.6* 1.4* 1.1 0.4  PROT 5.9* 5.9* 5.8* 5.2*  ALBUMIN 2.5* 2.4* 2.4* 2.2*   No results for input(s): LIPASE, AMYLASE in the last 168 hours. No results for input(s): AMMONIA in the last 168 hours. Coagulation Profile: No results for input(s): INR, PROTIME in the last 168 hours. Cardiac Enzymes: No results for input(s): CKTOTAL, CKMB, CKMBINDEX, TROPONINI in the last 168 hours. BNP (last 3 results) No results for input(s): PROBNP in the last 8760 hours. HbA1C: Recent Labs    07/16/19 1513  HGBA1C 6.3*   CBG: Recent Labs  Lab 07/16/19 1647 07/16/19 2044  GLUCAP 199* 230*   Lipid Profile: Recent Labs    07/15/19 0951  TRIG 111   Thyroid Function Tests: No results for input(s): TSH, T4TOTAL, FREET4, T3FREE, THYROIDAB in the last 72 hours. Anemia Panel: Recent Labs    07/16/19 0445 07/17/19 0235  FERRITIN 1,103* 960*   Urine analysis:    Component Value Date/Time   BILIRUBINUR negative 10/13/2012 1010   PROTEINUR negative 10/13/2012 1010   UROBILINOGEN 0.2 10/13/2012 1010   NITRITE negative 10/13/2012 1010   LEUKOCYTESUR Negative 10/13/2012 1010   Recent Results (from the past 240 hour(s))  Blood Culture (routine x 2)     Status: None (Preliminary result)    Collection Time: 07/15/19 10:42 AM   Specimen: BLOOD  Result Value Ref Range Status   Specimen Description BLOOD RIGHT ANTECUBITAL  Final   Special Requests   Final    BOTTLES DRAWN AEROBIC AND ANAEROBIC Blood Culture results may not be optimal due to an excessive volume of blood received in culture bottles   Culture   Final    NO GROWTH < 24 HOURS Performed at [facility], [place] [address] [place], [place] [address]    Report Status PENDING  Incomplete  Blood Culture (routine x 2)     Status: None (Preliminary result)   Collection Time: 07/15/19 10:43 AM   Specimen: BLOOD RIGHT HAND  Result Value Ref Range Status   Specimen Description BLOOD RIGHT HAND  Final   Special Requests   Final    BOTTLES DRAWN AEROBIC AND ANAEROBIC Blood Culture results may not be optimal due to an  excessive volume of blood received in culture bottles   Culture   Final    NO GROWTH < 24 HOURS Performed at [place] [address], [place], [place] [address]    Report Status PENDING  Incomplete  SARS Coronavirus 2 [facility] order, Performed in [facility] hospital lab) Nasopharyngeal Nasopharyngeal Swab     Status: Abnormal   Collection Time: 07/15/19 10:45 AM   Specimen: Nasopharyngeal Swab  Result Value Ref Range Status   SARS Coronavirus 2 POSITIVE (A) NEGATIVE Final    Comment: RESULT CALLED TO, READ BACK BY AND VERIFIED WITH: Lowell Bouton RN 12:55 07/15/19 ([email or web address]) (NOTE) If result is NEGATIVE SARS-CoV-2 target nucleic acids are NOT DETECTED. The SARS-CoV-2 RNA is generally detectable in upper and lower  respiratory specimens during the acute phase of infection. The lowest  concentration of SARS-CoV-2 viral copies this assay can detect is 250  copies / mL. A negative result does not preclude SARS-CoV-2 infection  and should not be used as the sole basis for treatment or other  patient management decisions.  A negative result may occur with  improper specimen collection / handling,  submission of specimen other  than nasopharyngeal swab, presence of viral mutation(s) within the  areas targeted by this assay, and inadequate number of viral copies  (<250 copies / mL). A negative result must be combined with clinical  observations, patient history, and epidemiological information. If result is POSITIVE SARS-CoV-2 target nucleic acids are DETECTED. The  SARS-CoV-2 RNA is generally detectable in upper and lower  respiratory specimens during the acute phase of infection.  Positive  results are indicative of active infection with SARS-CoV-2.  Clinical  correlation with patient history and other diagnostic information is  necessary to determine patient infection status.  Positive results do  not rule out bacterial infection or co-infection with other viruses. If result is PRESUMPTIVE POSTIVE SARS-CoV-2 nucleic acids MAY BE PRESENT.   A presumptive positive result was obtained on the submitted specimen  and confirmed on repeat testing.  While 2019 novel coronavirus  (SARS-CoV-2) nucleic acids may be present in the submitted sample  additional confirmatory testing may be necessary for epidemiological  and / or clinical management purposes  to differentiate between  SARS-CoV-2 and other Sarbecovirus currently known to infect humans.  If clinically indicated additional testing with an alternate test  methodology [phone]) is a dvised. The SARS-CoV-2 RNA is generally  detectable in upper and lower respiratory specimens during the acute  phase of infection. The expected result is Negative. Fact Sheet for Patients:  StrictlyIdeas.no Fact Sheet for Healthcare Providers: BankingDealers.co.za This test is not yet approved or cleared by the [place] FDA and has been authorized for detection and/or diagnosis of SARS-CoV-2 by FDA under an Emergency Use Authorization (EUA).  This EUA will remain in effect (meaning this test can be  used) for the duration of the COVID-19 declaration under Section 564(b)(1) of the Act, 21 U.S.C. section 360bbb-3(b)(1), unless the authorization is terminated or revoked sooner. Performed at [facility], [place] [address] [place], [place] [address]       Radiology Studies: Ct Angio Chest Pe W Or Wo Contrast  Result Date: 07/16/2019 CLINICAL DATA:  Shortness of breathw1 EXAM: CT ANGIOGRAPHY CHEST WITH CONTRAST TECHNIQUE: Multidetector CT imaging of the chest was performed using the standard protocol during bolus administration of intravenous contrast. Multiplanar CT image reconstructions and MIPs were obtained to evaluate the vascular anatomy. CONTRAST:  41m OMNIPAQUE IOHEXOL  350 MG/ML SOLN COMPARISON:  Chest radiograph July 15, 2019 FINDINGS: Cardiovascular: There is no demonstrable pulmonary embolus. There is no thoracic aortic aneurysm or dissection. There are scattered foci of calcification in the visualized great vessels. Note that the right innominate and left common carotid arteries arise as a common trunk, an anatomic variant. There is aortic atherosclerosis. There is no pericardial effusion or pericardial thickening. There are occasional foci of coronary artery calcification. Mediastinum/Nodes: Thyroid appears unremarkable. There are scattered subcentimeter mediastinal lymph nodes. No adenopathy by size criteria evident. There is a small hiatal hernia. Lungs/Pleura: There are ground-glass opacities diffusely throughout the lungs bilaterally, most pronounced in the lower lobes but fairly extensive in the lingula and right middle lobe and present to varying degrees in the upper lobes, primarily in the posterior segment right upper lobe. On axial slice 32 series 6, there is a 5 mm nodular opacity in the posterior segment right upper lobe. No pleural effusions are evident. Upper Abdomen: Stomach is filled with fluid and food material. There is incomplete visualization of a cyst arising from  the upper portion of the left kidney measuring 2.6 x 2.5 cm. Visualized upper abdominal structures otherwise appear unremarkable. Musculoskeletal: No blastic or lytic bone lesions. No evident chest wall lesions. Review of the MIP images confirms the above findings. IMPRESSION: 1. No demonstrable pulmonary embolus. No thoracic aortic aneurysm or dissection. There is aortic atherosclerosis as well as foci of great vessel and to a lesser extent coronary artery calcification. 2. Ground-glass type opacity throughout the lungs fairly diffusely, consistent with widespread pneumonia. Suspect atypical organism pneumonia including viral type pneumonia as a major differential consideration given this appearance. 3.  No adenopathy evident by size criteria. 4.  Small hiatal hernia. Aortic Atherosclerosis (ICD10-I70.0). Electronically Signed   By: Lowella Grip III M.D.   On: 07/16/2019 13:35   Dg Chest Port 1 View  Result Date: 07/15/2019 CLINICAL DATA:  Shortness of breath EXAM: PORTABLE CHEST 1 VIEW COMPARISON:  08/10/2014 FINDINGS: Heart size is within normal limits. Calcific aortic knob. No focal airspace consolidation, pleural effusion, or pneumothorax. IMPRESSION: No acute cardiopulmonary findings. Electronically Signed   By: Davina Poke M.D.   On: 07/15/2019 11:55    Scheduled Meds: . aspirin  81 mg Oral Daily  . docusate sodium  100 mg Oral BID  . enoxaparin (LOVENOX) injection  40 mg Subcutaneous Q24H  . finasteride  5 mg Oral Daily  . insulin aspart  0-5 Units Subcutaneous QHS  . insulin aspart  0-9 Units Subcutaneous TID WC  . methylPREDNISolone (SOLU-MEDROL) injection  40 mg Intravenous Q12H  . sodium chloride flush  3 mL Intravenous Q12H  . sodium chloride flush  3 mL Intravenous Q12H   Continuous Infusions: . sodium chloride    . remdesivir 100 mg in NS 250 mL       LOS: 2 days   Time spent: 35 minutes.  Patrecia Pour, MD [facility] [email or web address] Password [email or web address] 07/17/2019,  8:07 AM

## 2019-07-17 NOTE — Plan of Care (Signed)
Continue w POC. 

## 2019-07-18 LAB — COMPREHENSIVE METABOLIC PANEL
ALT: 248 U/L — ABNORMAL HIGH (ref 0–44)
AST: 102 U/L — ABNORMAL HIGH (ref 15–41)
Albumin: 2.3 g/dL — ABNORMAL LOW (ref 3.5–5.0)
Alkaline Phosphatase: 161 U/L — ABNORMAL HIGH (ref 38–126)
Anion gap: 9 (ref 5–15)
BUN: 33 mg/dL — ABNORMAL HIGH (ref 8–23)
CO2: 23 mmol/L (ref 22–32)
Calcium: 8.5 mg/dL — ABNORMAL LOW (ref 8.9–10.3)
Chloride: 107 mmol/L (ref 98–111)
Creatinine, Ser: 0.56 mg/dL — ABNORMAL LOW (ref 0.61–1.24)
GFR calc Af Amer: >60 mL/min (ref >60)
GFR calc non Af Amer: >60 mL/min (ref >60)
Glucose, Bld: 169 mg/dL — ABNORMAL HIGH (ref 70–99)
Potassium: 3.9 mmol/L (ref 3.5–5.1)
Sodium: 139 mmol/L (ref 135–145)
Total Bilirubin: 0.6 mg/dL (ref 0.3–1.2)
Total Protein: 5.4 g/dL — ABNORMAL LOW (ref 6.5–8.1)

## 2019-07-18 LAB — C-REACTIVE PROTEIN: CRP: 5.8 mg/dL — ABNORMAL HIGH (ref <1.0)

## 2019-07-18 LAB — CBC WITH DIFFERENTIAL/PLATELET
Abs Immature Granulocytes: 0.1 10*3/uL — ABNORMAL HIGH (ref 0.00–0.07)
Basophils Absolute: 0 10*3/uL (ref 0.0–0.1)
Basophils Relative: 0 %
Eosinophils Absolute: 0 10*3/uL (ref 0.0–0.5)
Eosinophils Relative: 0 %
HCT: 37.7 % — ABNORMAL LOW (ref 39.0–52.0)
Hemoglobin: 12.7 g/dL — ABNORMAL LOW (ref 13.0–17.0)
Immature Granulocytes: 1 %
Lymphocytes Relative: 4 %
Lymphs Abs: 0.6 10*3/uL — ABNORMAL LOW (ref 0.7–4.0)
MCH: 30.7 pg (ref 26.0–34.0)
MCHC: 33.7 g/dL (ref 30.0–36.0)
MCV: 91.1 fL (ref 80.0–100.0)
Monocytes Absolute: 0.6 10*3/uL (ref 0.1–1.0)
Monocytes Relative: 5 %
Neutro Abs: 12.6 10*3/uL — ABNORMAL HIGH (ref 1.7–7.7)
Neutrophils Relative %: 90 %
Platelets: 210 10*3/uL (ref 150–400)
RBC: 4.14 MIL/uL — ABNORMAL LOW (ref 4.22–5.81)
RDW: 14.3 % (ref 11.5–15.5)
WBC: 14 10*3/uL — ABNORMAL HIGH (ref 4.0–10.5)
nRBC: 0 % (ref 0.0–0.2)

## 2019-07-18 LAB — GLUCOSE, CAPILLARY
Glucose-Capillary: 137 mg/dL — ABNORMAL HIGH (ref 70–99)
Glucose-Capillary: 251 mg/dL — ABNORMAL HIGH (ref 70–99)
Glucose-Capillary: 251 mg/dL — ABNORMAL HIGH (ref 70–99)
Glucose-Capillary: 214 mg/dL — ABNORMAL HIGH (ref 70–99)

## 2019-07-18 LAB — FERRITIN: Ferritin: 627 ng/mL — ABNORMAL HIGH (ref 24–336)

## 2019-07-18 LAB — D-DIMER, QUANTITATIVE: D-Dimer, Quant: 0.88 ug/mL-FEU — ABNORMAL HIGH (ref 0.00–0.50)

## 2019-07-18 MED ORDER — TRAZODONE HCL 50 MG PO TABS: 50.0000 mg | ORAL_TABLET | Freq: Every evening | ORAL | Status: DC | PRN

## 2019-07-18 NOTE — Progress Notes (Signed)
PROGRESS NOTE  Jim Tucker  [id number] DOB: 08-25-1941 DOA: 07/15/2019 PCP: Gregor Hams, MD   Brief Narrative: Jim Tucker is a 78 y.o. male with a history of HLD, prediabetes, HTN, BPH and orthopedic surgeries who presented to the ED 8/6 with muscle aches and progressive shortness of breath since 8/1, the day he arrived by plane from [place]. This was associated with poor appetite. On evaluation he was hypoxic and afebrile with negative CXR. Inflammatory markers were grossly elevated (CRP 18.7, ferritin 1,855, LDH 446, fibrinogen >800, and d-dimer 2.14), and SARS-CoV-2 swab was positive. LFTs were elevated (AST 236, ALT 263, TBili 1.6, Alk Phos 198). Steroids were started and the patient was admitted to [facility]. Hypoxia continued with GGOs on CTA of the chest which also ruled out PE. After LFTs showed sustained improvement, remdesivir was started 8/7, though after 2 doses had to be held.  Assessment & Plan: Principal Problem:   Respiratory tract infection due to COVID-19 virus Active Problems:   Hyperlipidemia   BPH (benign prostatic hyperplasia)   HTN (hypertension)   Prediabetes  Acute hypoxic respiratory failure due to covid-19 pneumonia: Developed widespread GGOs on CT chest 24 hours after negative CXR with severely elevated inflammatory markers (CRP 19), entering 2nd week of illness. Hypoxia is stable and CRP coming down with steroids and initiation of remdesivir. No PE on CTA chest.  - Continue IV steroids  - Received 2 days of remdesivir (8/7 - 8/9; abbreviated course due to LFT elevation ALT >5x ULN)  - Continue airborne, contact precautions. - Check daily labs: CBC w/diff, CMP, d-dimer, ferritin, CRP - Enoxaparin prophylactic dose, '40mg'$  q24h. - Blood cultures drawn. No growth at 3 days. - Maintain euvolemia/net negative.  - Avoid NSAIDs - Recommend proning and aggressive use of incentive spirometry.  LFT elevation: Favor direct viral illness effect over other etiologies.  - Hold statin  - Trend  BPH: s/p TURP - Continue finasteride  HLD:  - Hold statin with LFT elevation  Prediabetes: HbA1c 6.3%. Anticipate steroid-induced hyperglycemia.  - Continue sensitive SSI for tight glucose control  DVT prophylaxis: Lovenox Code Status: Full Family Communication: Spoke with son today Disposition Plan: Uncertain. Discharge home expected once hypoxia improves and inpatient monitoring or treatment are needed.   Consultants:   None  Procedures:   None  Antimicrobials:  Remdesivir 8/7 >> 8/11  Subjective: Didn't sleep well, but breathing is slightly better. Has pain throughout the chest when he coughs or takes a deep breath. No leg swelling. This is overall stable. Getting up to bathroom independently with oxygen. Wants to take a shower. Does not have a recent history of falls.   Objective: Vitals:   07/17/19 1945 07/18/19 0400 07/18/19 0423 07/18/19 0833  BP: (!) 137/53 (!) 141/67  (!) 150/75  Pulse: (!) 54   68  Resp:    19  Temp: 97.9 F (36.6 C)  98 F (36.7 C) 98.5 F (36.9 C)  TempSrc: Oral  Oral Oral  SpO2: 94%   92%  Weight:      Height:        Intake/Output Summary (Last 24 hours) at 07/18/2019 1353 Last data filed at 07/18/2019 1300 Gross per 24 hour  Intake 960 ml  Output 950 ml  Net 10 ml   Filed Weights   07/15/19 0932  Weight: 54.4 kg   Gen: Nontoxic elderly male in no distress Pulm: Nonlabored. Crackles bilaterally, stable. CV: Regular rate and rhythm. No murmur, rub, or gallop. No JVD,  no dependent edema. GI: Abdomen soft, non-tender, non-distended, with normoactive bowel sounds.  Ext: Warm, no deformities Skin: No rashes, lesions or ulcers on visualized skin. Neuro: Alert and oriented. No focal neurological deficits. Psych: Judgement and insight appear fair. Mood euthymic & affect congruent. Behavior is appropriate.    Data Reviewed: I have personally reviewed following labs and imaging studies  CBC: Recent Labs   Lab 07/15/19 0951 07/16/19 0445 07/17/19 0235 07/18/19 0203  WBC 10.0 6.5 15.3* 14.0*  NEUTROABS 9.0* 5.9 14.0* 12.6*  HGB 15.0 13.6 12.7* 12.7*  HCT 45.5 40.9 37.9* 37.7*  MCV 93.8 91.1 90.9 91.1  PLT 182 166 186 010   Basic Metabolic Panel: Recent Labs  Lab 07/15/19 0951 07/16/19 0445 07/17/19 0235 07/18/19 0203  NA 137 137 138 139  K 3.3* 3.6 3.6 3.9  CL 104 107 109 107  CO2 21* 21* 22 23  GLUCOSE 131* 198* 222* 169*  BUN 19 23 35* 33*  CREATININE 0.69 0.59* 0.62 0.56*  CALCIUM 8.1* 8.3* 8.3* 8.5*  MG  --  2.2  --   --   PHOS  --  3.0  --   --    GFR: Estimated Creatinine Clearance: 58.6 mL/min (A) (by C-G formula based on SCr of 0.56 mg/dL (L)). Liver Function Tests: Recent Labs  Lab 07/15/19 0951 07/16/19 0445 07/16/19 1513 07/17/19 0235 07/18/19 0203  AST 236* 158* 110* 108* 102*  ALT 263* 230* 201* 205* 248*  ALKPHOS 198* 212* 200* 171* 161*  BILITOT 1.6* 1.4* 1.1 0.4 0.6  PROT 5.9* 5.9* 5.8* 5.2* 5.4*  ALBUMIN 2.5* 2.4* 2.4* 2.2* 2.3*   No results for input(s): LIPASE, AMYLASE in the last 168 hours. No results for input(s): AMMONIA in the last 168 hours. Coagulation Profile: No results for input(s): INR, PROTIME in the last 168 hours. Cardiac Enzymes: No results for input(s): CKTOTAL, CKMB, CKMBINDEX, TROPONINI in the last 168 hours. BNP (last 3 results) No results for input(s): PROBNP in the last 8760 hours. HbA1C: Recent Labs    07/16/19 1513  HGBA1C 6.3*   CBG: Recent Labs  Lab 07/17/19 1152 07/17/19 1757 07/17/19 2245 07/18/19 0812 07/18/19 1221  GLUCAP 292* 185* 245* 137* 251*   Lipid Profile: No results for input(s): CHOL, HDL, LDLCALC, TRIG, CHOLHDL, LDLDIRECT in the last 72 hours. Thyroid Function Tests: No results for input(s): TSH, T4TOTAL, FREET4, T3FREE, THYROIDAB in the last 72 hours. Anemia Panel: Recent Labs    07/17/19 0235 07/18/19 0203  FERRITIN 960* 627*   Urine analysis:    Component Value Date/Time    BILIRUBINUR negative 10/13/2012 1010   PROTEINUR negative 10/13/2012 1010   UROBILINOGEN 0.2 10/13/2012 1010   NITRITE negative 10/13/2012 1010   LEUKOCYTESUR Negative 10/13/2012 1010   Recent Results (from the past 240 hour(s))  Blood Culture (routine x 2)     Status: None (Preliminary result)   Collection Time: 07/15/19 10:42 AM   Specimen: BLOOD  Result Value Ref Range Status   Specimen Description BLOOD RIGHT ANTECUBITAL  Final   Special Requests   Final    BOTTLES DRAWN AEROBIC AND ANAEROBIC Blood Culture results may not be optimal due to an excessive volume of blood received in culture bottles   Culture   Final    NO GROWTH 3 DAYS Performed at [facility], [place] [address] [place], [place] [address]    Report Status PENDING  Incomplete  Blood Culture (routine x 2)     Status: None (  Preliminary result)   Collection Time: 07/15/19 10:43 AM   Specimen: BLOOD RIGHT HAND  Result Value Ref Range Status   Specimen Description BLOOD RIGHT HAND  Final   Special Requests   Final    BOTTLES DRAWN AEROBIC AND ANAEROBIC Blood Culture results may not be optimal due to an excessive volume of blood received in culture bottles   Culture   Final    NO GROWTH 3 DAYS Performed at [facility], [place] [address] [place], [place] [address]    Report Status PENDING  Incomplete  SARS Coronavirus 2 [facility] order, Performed in [facility] hospital lab) Nasopharyngeal Nasopharyngeal Swab     Status: Abnormal   Collection Time: 07/15/19 10:45 AM   Specimen: Nasopharyngeal Swab  Result Value Ref Range Status   SARS Coronavirus 2 POSITIVE (A) NEGATIVE Final    Comment: RESULT CALLED TO, READ BACK BY AND VERIFIED WITH: Lowell Bouton RN 12:55 07/15/19 ([email or web address]) (NOTE) If result is NEGATIVE SARS-CoV-2 target nucleic acids are NOT DETECTED. The SARS-CoV-2 RNA is generally detectable in upper and lower  respiratory specimens during the acute phase of infection. The lowest  concentration of  SARS-CoV-2 viral copies this assay can detect is 250  copies / mL. A negative result does not preclude SARS-CoV-2 infection  and should not be used as the sole basis for treatment or other  patient management decisions.  A negative result may occur with  improper specimen collection / handling, submission of specimen other  than nasopharyngeal swab, presence of viral mutation(s) within the  areas targeted by this assay, and inadequate number of viral copies  (<250 copies / mL). A negative result must be combined with clinical  observations, patient history, and epidemiological information. If result is POSITIVE SARS-CoV-2 target nucleic acids are DETECTED. The  SARS-CoV-2 RNA is generally detectable in upper and lower  respiratory specimens during the acute phase of infection.  Positive  results are indicative of active infection with SARS-CoV-2.  Clinical  correlation with patient history and other diagnostic information is  necessary to determine patient infection status.  Positive results do  not rule out bacterial infection or co-infection with other viruses. If result is PRESUMPTIVE POSTIVE SARS-CoV-2 nucleic acids MAY BE PRESENT.   A presumptive positive result was obtained on the submitted specimen  and confirmed on repeat testing.  While 2019 novel coronavirus  (SARS-CoV-2) nucleic acids may be present in the submitted sample  additional confirmatory testing may be necessary for epidemiological  and / or clinical management purposes  to differentiate between  SARS-CoV-2 and other Sarbecovirus currently known to infect humans.  If clinically indicated additional testing with an alternate test  methodology [id number]) is a dvised. The SARS-CoV-2 RNA is generally  detectable in upper and lower respiratory specimens during the acute  phase of infection. The expected result is Negative. Fact Sheet for Patients:  StrictlyIdeas.no Fact Sheet for Healthcare  Providers: BankingDealers.co.za This test is not yet approved or cleared by the [place] FDA and has been authorized for detection and/or diagnosis of SARS-CoV-2 by FDA under an Emergency Use Authorization (EUA).  This EUA will remain in effect (meaning this test can be used) for the duration of the COVID-19 declaration under Section 564(b)(1) of the Act, 21 U.S.C. section 360bbb-3(b)(1), unless the authorization is terminated or revoked sooner. Performed at [facility], [place] [address] [place], [place] [address]       Radiology Studies: No results found.  Scheduled Meds: .  aspirin  81 mg Oral Daily  . docusate sodium  100 mg Oral BID  . enoxaparin (LOVENOX) injection  40 mg Subcutaneous Q24H  . finasteride  5 mg Oral Daily  . insulin aspart  0-5 Units Subcutaneous QHS  . insulin aspart  0-9 Units Subcutaneous TID WC  . methylPREDNISolone (SOLU-MEDROL) injection  40 mg Intravenous Q12H  . sodium chloride flush  3 mL Intravenous Q12H  . sodium chloride flush  3 mL Intravenous Q12H   Continuous Infusions: . sodium chloride       LOS: 3 days   Time spent: 35 minutes.  Patrecia Pour, MD [facility] [email or web address] Password [email or web address] 07/18/2019, 1:53 PM

## 2019-07-18 NOTE — Plan of Care (Signed)
  Problem: Education: Goal: Knowledge of General Education information will improve Description: Including pain rating scale, medication(s)/side effects and non-pharmacologic comfort measures Outcome: Progressing   Problem: Health Behavior/Discharge Planning: Goal: Ability to manage health-related needs will improve Outcome: Progressing   Problem: Clinical Measurements: Goal: Ability to maintain clinical measurements within normal limits will improve Outcome: Progressing Goal: Will remain free from infection Outcome: Progressing Goal: Respiratory complications will improve Outcome: Progressing   Problem: Activity: Goal: Risk for activity intolerance will decrease Outcome: Progressing   Problem: Coping: Goal: Level of anxiety will decrease Outcome: Progressing   Problem: Pain Managment: Goal: General experience of comfort will improve Outcome: Progressing   Problem: Safety: Goal: Ability to remain free from injury will improve Outcome: Progressing   Problem: Clinical Measurements: Goal: Cardiovascular complication will be avoided Outcome: Completed/Met   Problem: Nutrition: Goal: Adequate nutrition will be maintained Outcome: Completed/Met   Problem: Elimination: Goal: Will not experience complications related to bowel motility Outcome: Completed/Met Goal: Will not experience complications related to urinary retention Outcome: Completed/Met

## 2019-07-19 LAB — CBC WITH DIFFERENTIAL/PLATELET
Abs Immature Granulocytes: 0.2 10*3/uL — ABNORMAL HIGH (ref 0.00–0.07)
Basophils Absolute: 0 10*3/uL (ref 0.0–0.1)
Basophils Relative: 0 %
Eosinophils Absolute: 0 10*3/uL (ref 0.0–0.5)
Eosinophils Relative: 0 %
HCT: 38.2 % — ABNORMAL LOW (ref 39.0–52.0)
Hemoglobin: 12.9 g/dL — ABNORMAL LOW (ref 13.0–17.0)
Immature Granulocytes: 2 %
Lymphocytes Relative: 5 %
Lymphs Abs: 0.6 10*3/uL — ABNORMAL LOW (ref 0.7–4.0)
MCH: 30.8 pg (ref 26.0–34.0)
MCHC: 33.8 g/dL (ref 30.0–36.0)
MCV: 91.2 fL (ref 80.0–100.0)
Monocytes Absolute: 0.6 10*3/uL (ref 0.1–1.0)
Monocytes Relative: 5 %
Neutro Abs: 10.8 10*3/uL — ABNORMAL HIGH (ref 1.7–7.7)
Neutrophils Relative %: 88 %
Platelets: 236 10*3/uL (ref 150–400)
RBC: 4.19 MIL/uL — ABNORMAL LOW (ref 4.22–5.81)
RDW: 14.6 % (ref 11.5–15.5)
WBC: 12.2 10*3/uL — ABNORMAL HIGH (ref 4.0–10.5)
nRBC: 0.2 % (ref 0.0–0.2)

## 2019-07-19 LAB — COMPREHENSIVE METABOLIC PANEL
ALT: 175 U/L — ABNORMAL HIGH (ref 0–44)
AST: 39 U/L (ref 15–41)
Albumin: 2.3 g/dL — ABNORMAL LOW (ref 3.5–5.0)
Alkaline Phosphatase: 140 U/L — ABNORMAL HIGH (ref 38–126)
Anion gap: 9 (ref 5–15)
BUN: 31 mg/dL — ABNORMAL HIGH (ref 8–23)
CO2: 23 mmol/L (ref 22–32)
Calcium: 8.4 mg/dL — ABNORMAL LOW (ref 8.9–10.3)
Chloride: 105 mmol/L (ref 98–111)
Creatinine, Ser: 0.63 mg/dL (ref 0.61–1.24)
GFR calc Af Amer: >60 mL/min (ref >60)
GFR calc non Af Amer: >60 mL/min (ref >60)
Glucose, Bld: 152 mg/dL — ABNORMAL HIGH (ref 70–99)
Potassium: 4 mmol/L (ref 3.5–5.1)
Sodium: 137 mmol/L (ref 135–145)
Total Bilirubin: 0.5 mg/dL (ref 0.3–1.2)
Total Protein: 5.4 g/dL — ABNORMAL LOW (ref 6.5–8.1)

## 2019-07-19 LAB — C-REACTIVE PROTEIN: CRP: 2.8 mg/dL — ABNORMAL HIGH (ref <1.0)

## 2019-07-19 LAB — GLUCOSE, CAPILLARY
Glucose-Capillary: 297 mg/dL — ABNORMAL HIGH (ref 70–99)
Glucose-Capillary: 161 mg/dL — ABNORMAL HIGH (ref 70–99)
Glucose-Capillary: 299 mg/dL — ABNORMAL HIGH (ref 70–99)
Glucose-Capillary: 263 mg/dL — ABNORMAL HIGH (ref 70–99)
Glucose-Capillary: 199 mg/dL — ABNORMAL HIGH (ref 70–99)

## 2019-07-19 LAB — FERRITIN: Ferritin: 426 ng/mL — ABNORMAL HIGH (ref 24–336)

## 2019-07-19 LAB — D-DIMER, QUANTITATIVE: D-Dimer, Quant: 0.72 ug/mL-FEU — ABNORMAL HIGH (ref 0.00–0.50)

## 2019-07-19 NOTE — Plan of Care (Signed)
  Problem: Education: Goal: Knowledge of General Education information will improve Description: Including pain rating scale, medication(s)/side effects and non-pharmacologic comfort measures Outcome: Progressing   Problem: Health Behavior/Discharge Planning: Goal: Ability to manage health-related needs will improve Outcome: Progressing   Problem: Clinical Measurements: Goal: Ability to maintain clinical measurements within normal limits will improve Outcome: Progressing Goal: Will remain free from infection Outcome: Progressing Goal: Diagnostic test results will improve Outcome: Progressing Goal: Respiratory complications will improve Outcome: Progressing   Problem: Activity: Goal: Risk for activity intolerance will decrease Outcome: Progressing   Problem: Education: Goal: Knowledge of risk factors and measures for prevention of condition will improve Outcome: Progressing   Problem: Coping: Goal: Psychosocial and spiritual needs will be supported Outcome: Progressing   Problem: Respiratory: Goal: Will maintain a patent airway Outcome: Progressing Goal: Complications related to the disease process, condition or treatment will be avoided or minimized Outcome: Progressing   Problem: Coping: Goal: Level of anxiety will decrease Outcome: Completed/Met   Problem: Pain Managment: Goal: General experience of comfort will improve Outcome: Completed/Met   Problem: Safety: Goal: Ability to remain free from injury will improve Outcome: Completed/Met   Problem: Skin Integrity: Goal: Risk for impaired skin integrity will decrease Outcome: Completed/Met

## 2019-07-19 NOTE — Progress Notes (Signed)
PROGRESS NOTE  Amo  [id number] DOB: 1940/12/14 DOA: 07/15/2019 PCP: Gregor Hams, MD   Brief Narrative: Jim Tucker is a 78 y.o. male with a history of HLD, prediabetes, HTN, BPH and orthopedic surgeries who presented to the ED 8/6 with muscle aches and progressive shortness of breath since 8/1, the day he arrived by plane from [place]. This was associated with poor appetite. On evaluation he was hypoxic and afebrile with negative CXR. Inflammatory markers were grossly elevated (CRP 18.7, ferritin 1,855, LDH 446, fibrinogen >800, and d-dimer 2.14), and SARS-CoV-2 swab was positive. LFTs were elevated (AST 236, ALT 263, TBili 1.6, Alk Phos 198). Steroids were started and the patient was admitted to [facility]. Hypoxia continued with GGOs on CTA of the chest which also ruled out PE. After LFTs showed sustained improvement, remdesivir was started 8/7, though after 2 doses had to be held.  Assessment & Plan: Principal Problem:   Respiratory tract infection due to COVID-19 virus Active Problems:   Hyperlipidemia   BPH (benign prostatic hyperplasia)   HTN (hypertension)   Prediabetes  Acute hypoxic respiratory failure due to covid-19 pneumonia: Developed widespread GGOs on CT chest 24 hours after negative CXR with severely elevated inflammatory markers (CRP 19), entering 2nd week of illness. Hypoxia is stable and CRP coming down with steroids and initiation of remdesivir. No PE on CTA chest.  - Continue IV steroids  - Received 2 days of remdesivir (8/7 - 8/9; abbreviated course due to LFT elevation ALT >5x ULN). Though LFTs have improved, he is stable enough to monitor another day before reinitating remdesivir as he is sustaining some clinical improvement and LFT changes have been sporadic. - Continue airborne, contact precautions. - Check daily labs: CBC w/diff, CMP, d-dimer, ferritin, CRP - Enoxaparin prophylactic dose, '40mg'$  q24h. - Blood cultures drawn. No growth from 8/6. - Maintain  euvolemia/net negative.  - Avoid NSAIDs - Recommend proning and aggressive use of incentive spirometry.  LFT elevation: Favor direct viral illness effect over other etiologies. - Holding statin  - Trend, improvement noted 8/10.  BPH: s/p TURP - Continue finasteride  HLD:  - Hold statin with LFT elevation  Prediabetes: HbA1c 6.3%. Anticipate steroid-induced hyperglycemia.  - Continue sensitive SSI for tight glucose control  DVT prophylaxis: Lovenox Code Status: Full Family Communication: Spoke with son and daughter today. Disposition Plan: Uncertain. Discharge home expected once hypoxia improves and inpatient monitoring or treatment are needed.  Consultants:   None  Procedures:   None  Antimicrobials:  Remdesivir 8/7 >> 8/11  Subjective: Feels his breathing is improved, but still short of breath with exertion, down to 89%, but generally feels comfortable off oxygen. No chest pain at time of interview.   Objective: Vitals:   07/18/19 1942 07/19/19 0415 07/19/19 0800 07/19/19 0802  BP: (!) 146/79 132/60  131/62  Pulse: (!) 52 (!) 52  (!) 58  Resp: 18 15    Temp: 98.1 F (36.7 C) 97.9 F (36.6 C)  98.4 F (36.9 C)  TempSrc: Oral Oral  Oral  SpO2: 95% 94% 92% 91%  Weight:      Height:        Intake/Output Summary (Last 24 hours) at 07/19/2019 1100 Last data filed at 07/19/2019 0900 Gross per 24 hour  Intake 480 ml  Output 625 ml  Net -145 ml   Filed Weights   07/15/19 0932  Weight: 54.4 kg   Gen: 78 y.o. male in no distress Pulm: Nonlabored breathing room air at rest,  mildly labored with exertion. Crackles diffusely. CV: Regular rate and rhythm. No murmur, rub, or gallop. No JVD, no dependent edema. GI: Abdomen soft, non-tender, non-distended, with normoactive bowel sounds.  Ext: Warm, no deformities Skin: No rashes, lesions or ulcers on visualized skin. Neuro: Alert and oriented. No focal neurological deficits. Psych: Judgement and insight appear fair.  Mood euthymic & affect congruent. Behavior is appropriate.    Data Reviewed: I have personally reviewed following labs and imaging studies  CBC: Recent Labs  Lab 07/15/19 0951 07/16/19 0445 07/17/19 0235 07/18/19 0203 07/19/19 0235  WBC 10.0 6.5 15.3* 14.0* 12.2*  NEUTROABS 9.0* 5.9 14.0* 12.6* 10.8*  HGB 15.0 13.6 12.7* 12.7* 12.9*  HCT 45.5 40.9 37.9* 37.7* 38.2*  MCV 93.8 91.1 90.9 91.1 91.2  PLT 182 166 186 210 500   Basic Metabolic Panel: Recent Labs  Lab 07/15/19 0951 07/16/19 0445 07/17/19 0235 07/18/19 0203 07/19/19 0235  NA 137 137 138 139 137  K 3.3* 3.6 3.6 3.9 4.0  CL 104 107 109 107 105  CO2 21* 21* '22 23 23  '$ GLUCOSE 131* 198* 222* 169* 152*  BUN 19 23 35* 33* 31*  CREATININE 0.69 0.59* 0.62 0.56* 0.63  CALCIUM 8.1* 8.3* 8.3* 8.5* 8.4*  MG  --  2.2  --   --   --   PHOS  --  3.0  --   --   --    GFR: Estimated Creatinine Clearance: 58.6 mL/min (by C-G formula based on SCr of 0.63 mg/dL). Liver Function Tests: Recent Labs  Lab 07/16/19 0445 07/16/19 1513 07/17/19 0235 07/18/19 0203 07/19/19 0235  AST 158* 110* 108* 102* 39  ALT 230* 201* 205* 248* 175*  ALKPHOS 212* 200* 171* 161* 140*  BILITOT 1.4* 1.1 0.4 0.6 0.5  PROT 5.9* 5.8* 5.2* 5.4* 5.4*  ALBUMIN 2.4* 2.4* 2.2* 2.3* 2.3*   No results for input(s): LIPASE, AMYLASE in the last 168 hours. No results for input(s): AMMONIA in the last 168 hours. Coagulation Profile: No results for input(s): INR, PROTIME in the last 168 hours. Cardiac Enzymes: No results for input(s): CKTOTAL, CKMB, CKMBINDEX, TROPONINI in the last 168 hours. BNP (last 3 results) No results for input(s): PROBNP in the last 8760 hours. HbA1C: Recent Labs    07/16/19 1513  HGBA1C 6.3*   CBG: Recent Labs  Lab 07/18/19 0812 07/18/19 1221 07/18/19 1741 07/18/19 2056 07/19/19 0818  GLUCAP 137* 251* 251* 214* 161*   Lipid Profile: No results for input(s): CHOL, HDL, LDLCALC, TRIG, CHOLHDL, LDLDIRECT in the last 72  hours. Thyroid Function Tests: No results for input(s): TSH, T4TOTAL, FREET4, T3FREE, THYROIDAB in the last 72 hours. Anemia Panel: Recent Labs    07/18/19 0203 07/19/19 0235  FERRITIN 627* 426*   Urine analysis:    Component Value Date/Time   BILIRUBINUR negative 10/13/2012 1010   PROTEINUR negative 10/13/2012 1010   UROBILINOGEN 0.2 10/13/2012 1010   NITRITE negative 10/13/2012 1010   LEUKOCYTESUR Negative 10/13/2012 1010   Recent Results (from the past 240 hour(s))  Blood Culture (routine x 2)     Status: None (Preliminary result)   Collection Time: 07/15/19 10:42 AM   Specimen: BLOOD  Result Value Ref Range Status   Specimen Description BLOOD RIGHT ANTECUBITAL  Final   Special Requests   Final    BOTTLES DRAWN AEROBIC AND ANAEROBIC Blood Culture results may not be optimal due to an excessive volume of blood received in culture bottles   Culture   Final  NO GROWTH 3 DAYS Performed at [facility], [place] [address], [place], [place] [address]    Report Status PENDING  Incomplete  Blood Culture (routine x 2)     Status: None (Preliminary result)   Collection Time: 07/15/19 10:43 AM   Specimen: BLOOD RIGHT HAND  Result Value Ref Range Status   Specimen Description BLOOD RIGHT HAND  Final   Special Requests   Final    BOTTLES DRAWN AEROBIC AND ANAEROBIC Blood Culture results may not be optimal due to an excessive volume of blood received in culture bottles   Culture   Final    NO GROWTH 3 DAYS Performed at [facility], [place] [address], [place],  [address]    Report Status PENDING  Incomplete  SARS Coronavirus 2 [facility] order, Performed in [facility] hospital lab) Nasopharyngeal Nasopharyngeal Swab     Status: Abnormal   Collection Time: 07/15/19 10:45 AM   Specimen: Nasopharyngeal Swab  Result Value Ref Range Status   SARS Coronavirus 2 POSITIVE (A) NEGATIVE Final    Comment: RESULT CALLED TO, READ BACK BY AND VERIFIED WITH: Lowell Bouton RN 12:55  07/15/19 ([email or web address]) (NOTE) If result is NEGATIVE SARS-CoV-2 target nucleic acids are NOT DETECTED. The SARS-CoV-2 RNA is generally detectable in upper and lower  respiratory specimens during the acute phase of infection. The lowest  concentration of SARS-CoV-2 viral copies this assay can detect is 250  copies / mL. A negative result does not preclude SARS-CoV-2 infection  and should not be used as the sole basis for treatment or other  patient management decisions.  A negative result may occur with  improper specimen collection / handling, submission of specimen other  than nasopharyngeal swab, presence of viral mutation(s) within the  areas targeted by this assay, and inadequate number of viral copies  (<250 copies / mL). A negative result must be combined with clinical  observations, patient history, and epidemiological information. If result is POSITIVE SARS-CoV-2 target nucleic acids are DETECTED. The  SARS-CoV-2 RNA is generally detectable in upper and lower  respiratory specimens during the acute phase of infection.  Positive  results are indicative of active infection with SARS-CoV-2.  Clinical  correlation with patient history and other diagnostic information is  necessary to determine patient infection status.  Positive results do  not rule out bacterial infection or co-infection with other viruses. If result is PRESUMPTIVE POSTIVE SARS-CoV-2 nucleic acids MAY BE PRESENT.   A presumptive positive result was obtained on the submitted specimen  and confirmed on repeat testing.  While 2019 novel coronavirus  (SARS-CoV-2) nucleic acids may be present in the submitted sample  additional confirmatory testing may be necessary for epidemiological  and / or clinical management purposes  to differentiate between  SARS-CoV-2 and other Sarbecovirus currently known to infect humans.  If clinically indicated additional testing with an alternate test  methodology [phone]) is a dvised. The  SARS-CoV-2 RNA is generally  detectable in upper and lower respiratory specimens during the acute  phase of infection. The expected result is Negative. Fact Sheet for Patients:  StrictlyIdeas.no Fact Sheet for Healthcare Providers: BankingDealers.co.za This test is not yet approved or cleared by the [place] FDA and has been authorized for detection and/or diagnosis of SARS-CoV-2 by FDA under an Emergency Use Authorization (EUA).  This EUA will remain in effect (meaning this test can be used) for the duration of the COVID-19 declaration under Section 564(b)(1) of the Act, 21 U.S.C. section  360bbb-3(b)(1), unless the authorization is terminated or revoked sooner. Performed at [facility], [place] [address], [place], [place] [address]       Radiology Studies: No results found.  Scheduled Meds: . aspirin  81 mg Oral Daily  . docusate sodium  100 mg Oral BID  . enoxaparin (LOVENOX) injection  40 mg Subcutaneous Q24H  . finasteride  5 mg Oral Daily  . insulin aspart  0-5 Units Subcutaneous QHS  . insulin aspart  0-9 Units Subcutaneous TID WC  . methylPREDNISolone (SOLU-MEDROL) injection  40 mg Intravenous Q12H  . sodium chloride flush  3 mL Intravenous Q12H  . sodium chloride flush  3 mL Intravenous Q12H   Continuous Infusions: . sodium chloride       LOS: 4 days   Time spent: 35 minutes.  Patrecia Pour, MD [facility] [email or web address] Password [email or web address] 07/19/2019, 11:00 AM

## 2019-07-20 LAB — CBC WITH DIFFERENTIAL/PLATELET
Abs Immature Granulocytes: 0.24 10*3/uL — ABNORMAL HIGH (ref 0.00–0.07)
Basophils Absolute: 0 10*3/uL (ref 0.0–0.1)
Basophils Relative: 0 %
Eosinophils Absolute: 0 10*3/uL (ref 0.0–0.5)
Eosinophils Relative: 0 %
HCT: 40.4 % (ref 39.0–52.0)
Hemoglobin: 13.2 g/dL (ref 13.0–17.0)
Immature Granulocytes: 2 %
Lymphocytes Relative: 4 %
Lymphs Abs: 0.6 10*3/uL — ABNORMAL LOW (ref 0.7–4.0)
MCH: 29.9 pg (ref 26.0–34.0)
MCHC: 32.7 g/dL (ref 30.0–36.0)
MCV: 91.6 fL (ref 80.0–100.0)
Monocytes Absolute: 0.7 10*3/uL (ref 0.1–1.0)
Monocytes Relative: 5 %
Neutro Abs: 12.6 10*3/uL — ABNORMAL HIGH (ref 1.7–7.7)
Neutrophils Relative %: 89 %
Platelets: 266 10*3/uL (ref 150–400)
RBC: 4.41 MIL/uL (ref 4.22–5.81)
RDW: 14.2 % (ref 11.5–15.5)
WBC: 14.1 10*3/uL — ABNORMAL HIGH (ref 4.0–10.5)
nRBC: 0.2 % (ref 0.0–0.2)

## 2019-07-20 LAB — GLUCOSE, CAPILLARY
Glucose-Capillary: 133 mg/dL — ABNORMAL HIGH (ref 70–99)
Glucose-Capillary: 187 mg/dL — ABNORMAL HIGH (ref 70–99)
Glucose-Capillary: 314 mg/dL — ABNORMAL HIGH (ref 70–99)
Glucose-Capillary: 324 mg/dL — ABNORMAL HIGH (ref 70–99)

## 2019-07-20 LAB — CULTURE, BLOOD (ROUTINE X 2)
Culture: NO GROWTH
Culture: NO GROWTH

## 2019-07-20 LAB — D-DIMER, QUANTITATIVE: D-Dimer, Quant: 0.66 ug/mL-FEU — ABNORMAL HIGH (ref 0.00–0.50)

## 2019-07-20 LAB — COMPREHENSIVE METABOLIC PANEL
ALT: 137 U/L — ABNORMAL HIGH (ref 0–44)
AST: 27 U/L (ref 15–41)
Albumin: 2.4 g/dL — ABNORMAL LOW (ref 3.5–5.0)
Alkaline Phosphatase: 154 U/L — ABNORMAL HIGH (ref 38–126)
Anion gap: 10 (ref 5–15)
BUN: 29 mg/dL — ABNORMAL HIGH (ref 8–23)
CO2: 23 mmol/L (ref 22–32)
Calcium: 8.1 mg/dL — ABNORMAL LOW (ref 8.9–10.3)
Chloride: 99 mmol/L (ref 98–111)
Creatinine, Ser: 0.54 mg/dL — ABNORMAL LOW (ref 0.61–1.24)
GFR calc Af Amer: >60 mL/min (ref >60)
GFR calc non Af Amer: >60 mL/min (ref >60)
Glucose, Bld: 169 mg/dL — ABNORMAL HIGH (ref 70–99)
Potassium: 4 mmol/L (ref 3.5–5.1)
Sodium: 132 mmol/L — ABNORMAL LOW (ref 135–145)
Total Bilirubin: 0.5 mg/dL (ref 0.3–1.2)
Total Protein: 5.3 g/dL — ABNORMAL LOW (ref 6.5–8.1)

## 2019-07-20 LAB — FERRITIN: Ferritin: 389 ng/mL — ABNORMAL HIGH (ref 24–336)

## 2019-07-20 LAB — C-REACTIVE PROTEIN: CRP: 1.8 mg/dL — ABNORMAL HIGH (ref <1.0)

## 2019-07-20 MED ORDER — SODIUM CHLORIDE 0.9 % IV SOLN
100.0000 mg | INTRAVENOUS | Status: AC
  Administered 2019-07-20 – 2019-07-22 (×3): 100 mg via INTRAVENOUS
  Filled 2019-07-20 (×3): qty 20

## 2019-07-20 NOTE — Progress Notes (Signed)
Remdesivir - Pharmacy Brief Note   Pt was admitted for COVID on 8/6. CXR was initially neg but CT is positive for LRTI. His baseline ALT was markedly elevated but it's not <220 and he was requiring O2.  Remdesivir was initially started on 8/7, but d/c after 2 doses due to elevation in ALT to 248.  O:  ALT: decreased to 137 CT +LRTI on 8/7 SpO2: 86% on 2L [place]   A/P:  Patient meets criteria for restarting remdesivir to complete his previous course. Remdesivir 100 mg daily x 3 days.  Monitor ALT.  Gretta Arab PharmD, BCPS Clinical pharmacist phone 7am- 5pm: [phone] 07/20/2019 9:05 AM

## 2019-07-20 NOTE — Progress Notes (Signed)
Pt requested Ambien at [facility], educated him on the possibilities of the ineffectiveness resulting in administering that early. Pt also have 2200 medications. Will administer Ambien with 2200 meds.

## 2019-07-20 NOTE — Progress Notes (Addendum)
Pt ambulated in hallway desalt to 87% on room air. Pt is frustrated this morning because he wanted a shower at [facility], tech and nurse was unavailable to provided shower at that time. Shower competed at [employer]

## 2019-07-20 NOTE — Progress Notes (Signed)
PROGRESS NOTE  [name]  [id number] DOB: [birth year] DOA: 07/15/2019 PCP: Gregor Hams, MD   Brief Narrative: Jim Tucker is a 78 y.o. male with a history of HLD, prediabetes, HTN, BPH and orthopedic surgeries who presented to the ED 8/6 with muscle aches and progressive shortness of breath since 8/1, the day he arrived by plane from [place]. This was associated with poor appetite. On evaluation he was hypoxic and afebrile with negative CXR. Inflammatory markers were grossly elevated (CRP 18.7, ferritin 1,855, LDH 446, fibrinogen >800, and d-dimer 2.14), and SARS-CoV-2 swab was positive. LFTs were elevated (AST 236, ALT 263, TBili 1.6, Alk Phos 198). Steroids were started and the patient was admitted to [facility]. Hypoxia continued with GGOs on CTA of the chest which also ruled out PE. After LFTs showed improvement, remdesivir was started 8/7, though after 2 doses had to be held. Hypoxia continues and LFTs have again improved, so remdesivir is to be restarted 8/11.  Assessment & Plan: Principal Problem:   Respiratory tract infection due to COVID-19 virus Active Problems:   Hyperlipidemia   BPH (benign prostatic hyperplasia)   HTN (hypertension)   Prediabetes  Acute hypoxic respiratory failure due to covid-19 pneumonia: Developed widespread GGOs on CT chest 24 hours after negative CXR with severely elevated inflammatory markers (CRP 19), entering 2nd week of illness. Hypoxia is stable and CRP coming down with steroids and initiation of remdesivir. No PE on CTA chest.  - Continue IV steroids  - Received 2 days of remdesivir (8/7 - 8/9; abbreviated course due to LFT elevation ALT >5x ULN). Though LFTs have improved and he remains hypoxic. D/w pharmacy, will restart remdesivir (would act as day 3 of 5).  - Continue airborne, contact precautions. - Check daily labs: CBC w/diff, CMP, d-dimer, ferritin, CRP - Enoxaparin prophylactic dose, '40mg'$  q24h. - Blood cultures drawn. No growth from 8/6.  - Maintain euvolemia/net negative.  - Avoid NSAIDs - Recommend proning and aggressive use of incentive spirometry.  LFT elevation: Favor direct viral illness effect over other etiologies. - Holding statin  - Trend, improvement noted 8/10 and continued 8/11.  BPH: s/p TURP - Continue finasteride  HLD:  - Hold statin with LFT elevation  Prediabetes: HbA1c 6.3%. Anticipate steroid-induced hyperglycemia.  - Continue sensitive SSI for tight glucose control. At inpatient goal.  DVT prophylaxis: Lovenox Code Status: Full Family Communication: Will speak with son and daughter again today. Disposition Plan: Uncertain. Discharge home expected once hypoxia improves and inpatient monitoring or treatment are needed. Possibly 8/13.  Consultants:   None  Procedures:   None  Antimicrobials:  Remdesivir 8/7 - 8/11; 8/11 - 8/13 (planned)  Subjective: No improvement in shortness of breath which is moderate worse with exertion and confirmed to be associated with hypoxia. No new cough. Burning chest pain with deep breaths is stable if not slightly improved. No chest pain with exertion. Frustrated that he didn't get to walk as much yesterday as he would have liked. RN at bedside and will walk with patient right now.   Objective: Vitals:   07/19/19 1927 07/20/19 0447 07/20/19 0730 07/20/19 0903  BP: (!) 157/78 130/77  (!) 121/99  Pulse: 60 (!) 54  (!) 113  Resp:    20  Temp: 98.5 F (36.9 C) 98.1 F (36.7 C)  98.1 F (36.7 C)  TempSrc: Oral Oral  Oral  SpO2: 94% 94% 95% (!) 86%  Weight:      Height:        Intake/Output  Summary (Last 24 hours) at 07/20/2019 0905 Last data filed at 07/20/2019 0749 Gross per 24 hour  Intake -  Output 1475 ml  Net -1475 ml   Filed Weights   07/15/19 0932  Weight: 54.4 kg   Gen: 78 y.o. male in no distress Pulm: Nonlabored tachypnea. Bilateral crackles, slightly improved. No wheezing.. CV: Regular rate and rhythm. No murmur, rub, or gallop. No  JVD, no dependent edema. GI: Abdomen soft, non-tender, non-distended, with normoactive bowel sounds.  Ext: Warm, no deformities Skin: No rashes, lesions or ulcers on visualized skin. Neuro: Alert and oriented. No focal neurological deficits. Psych: Judgement and insight appear fair. Mood euthymic & affect congruent. Behavior is appropriate.    Data Reviewed: I have personally reviewed following labs and imaging studies  CBC: Recent Labs  Lab 07/16/19 0445 07/17/19 0235 07/18/19 0203 07/19/19 0235 07/20/19 0250  WBC 6.5 15.3* 14.0* 12.2* 14.1*  NEUTROABS 5.9 14.0* 12.6* 10.8* 12.6*  HGB 13.6 12.7* 12.7* 12.9* 13.2  HCT 40.9 37.9* 37.7* 38.2* 40.4  MCV 91.1 90.9 91.1 91.2 91.6  PLT 166 186 210 236 626   Basic Metabolic Panel: Recent Labs  Lab 07/16/19 0445 07/17/19 0235 07/18/19 0203 07/19/19 0235 07/20/19 0250  NA 137 138 139 137 132*  K 3.6 3.6 3.9 4.0 4.0  CL 107 109 107 105 99  CO2 21* '22 23 23 23  '$ GLUCOSE 198* 222* 169* 152* 169*  BUN 23 35* 33* 31* 29*  CREATININE 0.59* 0.62 0.56* 0.63 0.54*  CALCIUM 8.3* 8.3* 8.5* 8.4* 8.1*  MG 2.2  --   --   --   --   PHOS 3.0  --   --   --   --    GFR: Estimated Creatinine Clearance: 58.6 mL/min (A) (by C-G formula based on SCr of 0.54 mg/dL (L)). Liver Function Tests: Recent Labs  Lab 07/16/19 1513 07/17/19 0235 07/18/19 0203 07/19/19 0235 07/20/19 0250  AST 110* 108* 102* 39 27  ALT 201* 205* 248* 175* 137*  ALKPHOS 200* 171* 161* 140* 154*  BILITOT 1.1 0.4 0.6 0.5 0.5  PROT 5.8* 5.2* 5.4* 5.4* 5.3*  ALBUMIN 2.4* 2.2* 2.3* 2.3* 2.4*   No results for input(s): LIPASE, AMYLASE in the last 168 hours. No results for input(s): AMMONIA in the last 168 hours. Coagulation Profile: No results for input(s): INR, PROTIME in the last 168 hours. Cardiac Enzymes: No results for input(s): CKTOTAL, CKMB, CKMBINDEX, TROPONINI in the last 168 hours. BNP (last 3 results) No results for input(s): PROBNP in the last 8760 hours.  HbA1C: No results for input(s): HGBA1C in the last 72 hours. CBG: Recent Labs  Lab 07/19/19 0818 07/19/19 1150 07/19/19 1650 07/19/19 2020 07/20/19 0827  GLUCAP 161* 299* 263* 199* 133*   Lipid Profile: No results for input(s): CHOL, HDL, LDLCALC, TRIG, CHOLHDL, LDLDIRECT in the last 72 hours. Thyroid Function Tests: No results for input(s): TSH, T4TOTAL, FREET4, T3FREE, THYROIDAB in the last 72 hours. Anemia Panel: Recent Labs    07/19/19 0235 07/20/19 0250  FERRITIN 426* 389*   Urine analysis:    Component Value Date/Time   BILIRUBINUR negative 10/13/2012 1010   PROTEINUR negative 10/13/2012 1010   UROBILINOGEN 0.2 10/13/2012 1010   NITRITE negative 10/13/2012 1010   LEUKOCYTESUR Negative 10/13/2012 1010   Recent Results (from the past 240 hour(s))  Blood Culture (routine x 2)     Status: None (Preliminary result)   Collection Time: 07/15/19 10:42 AM   Specimen: BLOOD  Result  Value Ref Range Status   Specimen Description BLOOD RIGHT ANTECUBITAL  Final   Special Requests   Final    BOTTLES DRAWN AEROBIC AND ANAEROBIC Blood Culture results may not be optimal due to an excessive volume of blood received in culture bottles   Culture   Final    NO GROWTH 4 DAYS Performed at [place] [address] [place], [place] [address]    Report Status PENDING  Incomplete  Blood Culture (routine x 2)     Status: None (Preliminary result)   Collection Time: 07/15/19 10:43 AM   Specimen: BLOOD RIGHT HAND  Result Value Ref Range Status   Specimen Description BLOOD RIGHT HAND  Final   Special Requests   Final    BOTTLES DRAWN AEROBIC AND ANAEROBIC Blood Culture results may not be optimal due to an excessive volume of blood received in culture bottles   Culture   Final    NO GROWTH 4 DAYS Performed at [facility], [place] [address] [place], [place] [address]    Report Status PENDING  Incomplete  SARS Coronavirus 2 [facility] order, Performed in [facility]  hospital lab) Nasopharyngeal Nasopharyngeal Swab     Status: Abnormal   Collection Time: 07/15/19 10:45 AM   Specimen: Nasopharyngeal Swab  Result Value Ref Range Status   SARS Coronavirus 2 POSITIVE (A) NEGATIVE Final    Comment: RESULT CALLED TO, READ BACK BY AND VERIFIED WITH: Lowell Bouton RN 12:55 07/15/19 ([email or web address]) (NOTE) If result is NEGATIVE SARS-CoV-2 target nucleic acids are NOT DETECTED. The SARS-CoV-2 RNA is generally detectable in upper and lower  respiratory specimens during the acute phase of infection. The lowest  concentration of SARS-CoV-2 viral copies this assay can detect is 250  copies / mL. A negative result does not preclude SARS-CoV-2 infection  and should not be used as the sole basis for treatment or other  patient management decisions.  A negative result may occur with  improper specimen collection / handling, submission of specimen other  than nasopharyngeal swab, presence of viral mutation(s) within the  areas targeted by this assay, and inadequate number of viral copies  (<250 copies / mL). A negative result must be combined with clinical  observations, patient history, and epidemiological information. If result is POSITIVE SARS-CoV-2 target nucleic acids are DETECTED. The  SARS-CoV-2 RNA is generally detectable in upper and lower  respiratory specimens during the acute phase of infection.  Positive  results are indicative of active infection with SARS-CoV-2.  Clinical  correlation with patient history and other diagnostic information is  necessary to determine patient infection status.  Positive results do  not rule out bacterial infection or co-infection with other viruses. If result is PRESUMPTIVE POSTIVE SARS-CoV-2 nucleic acids MAY BE PRESENT.   A presumptive positive result was obtained on the submitted specimen  and confirmed on repeat testing.  While 2019 novel coronavirus  (SARS-CoV-2) nucleic acids may be present in the submitted sample  additional  confirmatory testing may be necessary for epidemiological  and / or clinical management purposes  to differentiate between  SARS-CoV-2 and other Sarbecovirus currently known to infect humans.  If clinically indicated additional testing with an alternate test  methodology [phone]) is a dvised. The SARS-CoV-2 RNA is generally  detectable in upper and lower respiratory specimens during the acute  phase of infection. The expected result is Negative. Fact Sheet for Patients:  StrictlyIdeas.no Fact Sheet for Healthcare Providers: BankingDealers.co.za This test is not yet  approved or cleared by the [place] and has been authorized for detection and/or diagnosis of SARS-CoV-2 by FDA under an Emergency Use Authorization (EUA).  This EUA will remain in effect (meaning this test can be used) for the duration of the COVID-19 declaration under Section 564(b)(1) of the Act, 21 U.S.C. section 360bbb-3(b)(1), unless the authorization is terminated or revoked sooner. Performed at [facility], [place] [address], [place], [place] [address]       Radiology Studies: No results found.  Scheduled Meds: . aspirin  81 mg Oral Daily  . docusate sodium  100 mg Oral BID  . enoxaparin (LOVENOX) injection  40 mg Subcutaneous Q24H  . finasteride  5 mg Oral Daily  . insulin aspart  0-5 Units Subcutaneous QHS  . insulin aspart  0-9 Units Subcutaneous TID WC  . methylPREDNISolone (SOLU-MEDROL) injection  40 mg Intravenous Q12H  . sodium chloride flush  3 mL Intravenous Q12H  . sodium chloride flush  3 mL Intravenous Q12H   Continuous Infusions: . sodium chloride       LOS: 5 days   Time spent: 35 minutes.  Patrecia Pour, MD [facility] [email or web address] Password [email or web address] 07/20/2019, 9:05 AM

## 2019-07-20 NOTE — Plan of Care (Signed)
  Problem: Education: Goal: Knowledge of General Education information will improve Description: Including pain rating scale, medication(s)/side effects and non-pharmacologic comfort measures Outcome: Progressing   Problem: Health Behavior/Discharge Planning: Goal: Ability to manage health-related needs will improve Outcome: Progressing   Problem: Clinical Measurements: Goal: Ability to maintain clinical measurements within normal limits will improve Outcome: Progressing Goal: Will remain free from infection Outcome: Progressing Goal: Diagnostic test results will improve Outcome: Progressing Goal: Respiratory complications will improve Outcome: Progressing   Problem: Activity: Goal: Risk for activity intolerance will decrease Outcome: Progressing   Problem: Education: Goal: Knowledge of risk factors and measures for prevention of condition will improve Outcome: Progressing   Problem: Coping: Goal: Psychosocial and spiritual needs will be supported Outcome: Progressing   Problem: Respiratory: Goal: Will maintain a patent airway Outcome: Progressing Goal: Complications related to the disease process, condition or treatment will be avoided or minimized Outcome: Progressing

## 2019-07-21 DIAGNOSIS — J069 Acute upper respiratory infection, unspecified: Secondary | ICD-10-CM

## 2019-07-21 LAB — CBC WITH DIFFERENTIAL/PLATELET
Abs Immature Granulocytes: 0.19 10*3/uL — ABNORMAL HIGH (ref 0.00–0.07)
Basophils Absolute: 0 10*3/uL (ref 0.0–0.1)
Basophils Relative: 0 %
Eosinophils Absolute: 0 10*3/uL (ref 0.0–0.5)
Eosinophils Relative: 0 %
HCT: 39.9 % (ref 39.0–52.0)
Hemoglobin: 13.1 g/dL (ref 13.0–17.0)
Immature Granulocytes: 1 %
Lymphocytes Relative: 3 %
Lymphs Abs: 0.5 10*3/uL — ABNORMAL LOW (ref 0.7–4.0)
MCH: 30.3 pg (ref 26.0–34.0)
MCHC: 32.8 g/dL (ref 30.0–36.0)
MCV: 92.1 fL (ref 80.0–100.0)
Monocytes Absolute: 0.6 10*3/uL (ref 0.1–1.0)
Monocytes Relative: 4 %
Neutro Abs: 15.3 10*3/uL — ABNORMAL HIGH (ref 1.7–7.7)
Neutrophils Relative %: 92 %
Platelets: 281 10*3/uL (ref 150–400)
RBC: 4.33 MIL/uL (ref 4.22–5.81)
RDW: 14.5 % (ref 11.5–15.5)
WBC: 16.7 10*3/uL — ABNORMAL HIGH (ref 4.0–10.5)
nRBC: 0 % (ref 0.0–0.2)

## 2019-07-21 LAB — COMPREHENSIVE METABOLIC PANEL
ALT: 101 U/L — ABNORMAL HIGH (ref 0–44)
AST: 25 U/L (ref 15–41)
Albumin: 2.3 g/dL — ABNORMAL LOW (ref 3.5–5.0)
Alkaline Phosphatase: 140 U/L — ABNORMAL HIGH (ref 38–126)
Anion gap: 9 (ref 5–15)
BUN: 32 mg/dL — ABNORMAL HIGH (ref 8–23)
CO2: 24 mmol/L (ref 22–32)
Calcium: 8.4 mg/dL — ABNORMAL LOW (ref 8.9–10.3)
Chloride: 103 mmol/L (ref 98–111)
Creatinine, Ser: 0.6 mg/dL — ABNORMAL LOW (ref 0.61–1.24)
GFR calc Af Amer: >60 mL/min (ref >60)
GFR calc non Af Amer: >60 mL/min (ref >60)
Glucose, Bld: 195 mg/dL — ABNORMAL HIGH (ref 70–99)
Potassium: 4.2 mmol/L (ref 3.5–5.1)
Sodium: 136 mmol/L (ref 135–145)
Total Bilirubin: 0.7 mg/dL (ref 0.3–1.2)
Total Protein: 5.3 g/dL — ABNORMAL LOW (ref 6.5–8.1)

## 2019-07-21 LAB — GLUCOSE, CAPILLARY
Glucose-Capillary: 211 mg/dL — ABNORMAL HIGH (ref 70–99)
Glucose-Capillary: 350 mg/dL — ABNORMAL HIGH (ref 70–99)
Glucose-Capillary: 244 mg/dL — ABNORMAL HIGH (ref 70–99)
Glucose-Capillary: 268 mg/dL — ABNORMAL HIGH (ref 70–99)

## 2019-07-21 LAB — C-REACTIVE PROTEIN: CRP: 6.2 mg/dL — ABNORMAL HIGH (ref <1.0)

## 2019-07-21 LAB — D-DIMER, QUANTITATIVE: D-Dimer, Quant: 0.66 ug/mL-FEU — ABNORMAL HIGH (ref 0.00–0.50)

## 2019-07-21 MED ORDER — INSULIN ASPART 100 UNIT/ML ~~LOC~~ SOLN
0.0000 [IU] | Freq: Three times a day (TID) | SUBCUTANEOUS | Status: DC
  Administered 2019-07-21: 17:00:00 5 [IU] via SUBCUTANEOUS
  Administered 2019-07-22: 08:00:00 2 [IU] via SUBCUTANEOUS
  Administered 2019-07-22: 12:00:00 11 [IU] via SUBCUTANEOUS

## 2019-07-21 NOTE — Plan of Care (Signed)
  Problem: Education: Goal: Knowledge of General Education information will improve Description: Including pain rating scale, medication(s)/side effects and non-pharmacologic comfort measures Outcome: Progressing   Problem: Health Behavior/Discharge Planning: Goal: Ability to manage health-related needs will improve Outcome: Progressing   Problem: Clinical Measurements: Goal: Ability to maintain clinical measurements within normal limits will improve Outcome: Progressing Goal: Will remain free from infection Outcome: Progressing Goal: Diagnostic test results will improve Outcome: Progressing Goal: Respiratory complications will improve Outcome: Progressing   Problem: Activity: Goal: Risk for activity intolerance will decrease Outcome: Progressing   Problem: Education: Goal: Knowledge of risk factors and measures for prevention of condition will improve Outcome: Progressing   Problem: Coping: Goal: Psychosocial and spiritual needs will be supported Outcome: Progressing   Problem: Respiratory: Goal: Will maintain a patent airway Outcome: Progressing Goal: Complications related to the disease process, condition or treatment will be avoided or minimized Outcome: Progressing

## 2019-07-21 NOTE — Progress Notes (Addendum)
Inpatient Diabetes Program Recommendations  AACE/ADA: New Consensus Statement on Inpatient Glycemic Control (2015)  Target Ranges:  Prepandial:   less than 140 mg/dL      Peak postprandial:   less than 180 mg/dL (1-2 hours)      Critically ill patients:  140 - 180 mg/dL     Review of Glycemic Control Results for Jim Tucker, Jim Tucker (MRN [id number]) as of 07/21/2019 11:48  Ref. Range 07/20/2019 08:27 07/20/2019 12:15 07/20/2019 16:57 07/20/2019 21:13 07/21/2019 07:32  Glucose-Capillary Latest Ref Range: 70 - 99 mg/dL 133 (H) 187 (H) 314 (H) 324 (H) 211 (H)   Diabetes history: PreDM  Current orders for Inpatient glycemic control:  Novolog 0-9 units tid Novolog 0-5 units qhs  Solumedrol 40 mg Q12 hours  Inpatient Diabetes Program Recommendations:    Consider increasing Novolog to Moderate Correction 0-15 units tid.  May also need Novolog 2 units tid for meal coverage as postprandial glucose trends increase. Hold if patient consumes <50% of meals.  Thanks,  Tama Headings RN, MSN, BC-ADM Inpatient Diabetes Coordinator Team Pager [phone] (8a-5p)

## 2019-07-21 NOTE — Progress Notes (Signed)
Updated son on patient's status addressed all concerns.

## 2019-07-21 NOTE — Progress Notes (Signed)
PROGRESS NOTE  [place]  [id number] DOB: June 06, 1941 DOA: 07/15/2019 PCP: Gregor Hams, MD   Brief Narrative: Jim Tucker is a 78 y.o. male with a history of HLD, prediabetes, HTN, BPH and orthopedic surgeries who presented to the ED 8/6 with muscle aches and progressive shortness of breath since 8/1, the day he arrived by plane from [place]. This was associated with poor appetite. On evaluation he was hypoxic and afebrile with negative CXR. Inflammatory markers were grossly elevated (CRP 18.7, ferritin 1,855, LDH 446, fibrinogen >800, and d-dimer 2.14), and SARS-CoV-2 swab was positive. LFTs were elevated (AST 236, ALT 263, TBili 1.6, Alk Phos 198). Steroids were started and the patient was admitted to [facility]. Hypoxia continued with GGOs on CTA of the chest which also ruled out PE. After LFTs showed improvement, remdesivir was started 8/7, though after 2 doses had to be held. Hypoxia continues and LFTs have again improved, so remdesivir is to be restarted 8/11.   Subjective:  Reports his dyspnea has improved, but still significant on exertion, no chest pain, no nausea or vomiting.  Assessment & Plan: Principal Problem:   Respiratory tract infection due to COVID-19 virus Active Problems:   Hyperlipidemia   BPH (benign prostatic hyperplasia)   HTN (hypertension)   Prediabetes  Acute hypoxic respiratory failure due to covid-19 pneumonia -  Developed widespread GGOs on CT chest 24 hours after negative CXR with severely elevated inflammatory markers (CRP 19), entering 2nd week of illness.  -Hypoxia is improving,  -Inflammatory markers has been improving, but CRP has increased from 1.8 > X.2 over last 24 hours, will continue to monitor . -Continue with IV Solu-Medrol 40 mg every 12 hours . - No PE on CTA chest.  - Received 2 days of remdesivir (8/7 - 8/9; abbreviated course due to LFT elevation ALT >5x ULN). Though LFTs have improved and he remains hypoxic. D/w pharmacy, will restart  remdesivir (would act as day 3 of 5).  -DVT prophylaxis per COVID-19 protocol -He was encouraged to use incentive spirometry  LFT elevation: -Most likely due to COVID-19, trending down. - Holding statin   BPH: s/p TURP - Continue finasteride  HLD:  - Hold statin with LFT elevation  Prediabetes: HbA1c 6.3%. Anticipate steroid-induced hyperglycemia.  - Continue sensitive SSI for tight glucose control. At inpatient goal.  DVT prophylaxis: Lovenox Code Status: Full Family Communication: Will speak with son and daughter again today. Disposition Plan: Uncertain. Discharge home expected once hypoxia improves and inpatient monitoring or treatment are needed. Possibly 8/13.  Consultants:   None  Procedures:   None  Antimicrobials:  Remdesivir 8/7 - 8/11; 8/11 - 8/13 (planned)   Objective: Vitals:   07/20/19 1600 07/20/19 1947 07/21/19 0510 07/21/19 0734  BP: 120/65 110/61 119/69   Pulse:  60 (!) 50 (!) 58  Resp:  19 18 (!) 22  Temp: 98.5 F (36.9 C) 98.7 F (37.1 C) 97.9 F (36.6 C) 97.8 F (36.6 C)  TempSrc: Oral Oral Oral Oral  SpO2:  96% 96% 94%  Weight:      Height:        Intake/Output Summary (Last 24 hours) at 07/21/2019 1152 Last data filed at 07/21/2019 0848 Gross per 24 hour  Intake 733 ml  Output 500 ml  Net 233 ml   Filed Weights   07/15/19 0932  Weight: 54.4 kg    Awake Alert, Oriented X 3, No new F.N deficits, Normal affect Symmetrical Chest wall movement, Good air movement bilaterally, CTAB RRR,No  Gallops,Rubs or new Murmurs, No Parasternal Heave +ve B.Sounds, Abd Soft, No tenderness, No rebound - guarding or rigidity. No Cyanosis, Clubbing or edema, No new Rash or bruise     Data Reviewed: I have personally reviewed following labs and imaging studies  CBC: Recent Labs  Lab 07/17/19 0235 07/18/19 0203 07/19/19 0235 07/20/19 0250 07/21/19 0255  WBC 15.3* 14.0* 12.2* 14.1* 16.7*  NEUTROABS 14.0* 12.6* 10.8* 12.6* 15.3*  HGB 12.7*  12.7* 12.9* 13.2 13.1  HCT 37.9* 37.7* 38.2* 40.4 39.9  MCV 90.9 91.1 91.2 91.6 92.1  PLT 186 210 236 266 740   Basic Metabolic Panel: Recent Labs  Lab 07/16/19 0445 07/17/19 0235 07/18/19 0203 07/19/19 0235 07/20/19 0250 07/21/19 0255  NA 137 138 139 137 132* 136  K 3.6 3.6 3.9 4.0 4.0 4.2  CL 107 109 107 105 99 103  CO2 21* _0 GLUCOSE 198* 222* 169* 152* 169* 195*  BUN 23 35* 33* 31* 29* 32*  CREATININE 0.59* 0.62 0.56* 0.63 0.54* 0.60*  CALCIUM 8.3* 8.3* 8.5* 8.4* 8.1* 8.4*  MG 2.2  --   --   --   --   --   PHOS 3.0  --   --   --   --   --    GFR: Estimated Creatinine Clearance: 58.6 mL/min (A) (by C-G formula based on SCr of 0.6 mg/dL (L)). Liver Function Tests: Recent Labs  Lab 07/17/19 0235 07/18/19 0203 07/19/19 0235 07/20/19 0250 07/21/19 0255  AST 108* 102* 39 27 25  ALT 205* 248* 175* 137* 101*  ALKPHOS 171* 161* 140* 154* 140*  BILITOT 0.4 0.6 0.5 0.5 0.7  PROT 5.2* 5.4* 5.4* 5.3* 5.3*  ALBUMIN 2.2* 2.3* 2.3* 2.4* 2.3*   No results for input(s): LIPASE, AMYLASE in the last 168 hours. No results for input(s): AMMONIA in the last 168 hours. Coagulation Profile: No results for input(s): INR, PROTIME in the last 168 hours. Cardiac Enzymes: No results for input(s): CKTOTAL, CKMB, CKMBINDEX, TROPONINI in the last 168 hours. BNP (last 3 results) No results for input(s): PROBNP in the last 8760 hours. HbA1C: No results for input(s): HGBA1C in the last 72 hours. CBG: Recent Labs  Lab 07/20/19 0827 07/20/19 1215 07/20/19 1657 07/20/19 2113 07/21/19 0732  GLUCAP 133* 187* 314* 324* 211*   Lipid Profile: No results for input(s): CHOL, HDL, LDLCALC, TRIG, CHOLHDL, LDLDIRECT in the last 72 hours. Thyroid Function Tests: No results for input(s): TSH, T4TOTAL, FREET4, T3FREE, THYROIDAB in the last 72 hours. Anemia Panel: Recent Labs    07/19/19 0235 07/20/19 0250  FERRITIN 426* 389*   Urine analysis:    Component Value Date/Time    BILIRUBINUR negative 10/13/2012 1010   PROTEINUR negative 10/13/2012 1010   UROBILINOGEN 0.2 10/13/2012 1010   NITRITE negative 10/13/2012 1010   LEUKOCYTESUR Negative 10/13/2012 1010   Recent Results (from the past 240 hour(s))  Blood Culture (routine x 2)     Status: None   Collection Time: 07/15/19 10:42 AM   Specimen: BLOOD  Result Value Ref Range Status   Specimen Description BLOOD RIGHT ANTECUBITAL  Final   Special Requests   Final    BOTTLES DRAWN AEROBIC AND ANAEROBIC Blood Culture results may not be optimal due to an excessive volume of blood received in culture bottles   Culture   Final    NO GROWTH 5 DAYS Performed at [facility], [place] [address] [place], [place] [address]  Report Status 07/20/2019 FINAL  Final  Blood Culture (routine x 2)     Status: None   Collection Time: 07/15/19 10:43 AM   Specimen: BLOOD RIGHT HAND  Result Value Ref Range Status   Specimen Description BLOOD RIGHT HAND  Final   Special Requests   Final    BOTTLES DRAWN AEROBIC AND ANAEROBIC Blood Culture results may not be optimal due to an excessive volume of blood received in culture bottles   Culture   Final    NO GROWTH 5 DAYS Performed at [facility], [place] [address], [place], [place] [address]    Report Status 07/20/2019 FINAL  Final  SARS Coronavirus 2 [facility] order, Performed in [facility] hospital lab) Nasopharyngeal Nasopharyngeal Swab     Status: Abnormal   Collection Time: 07/15/19 10:45 AM   Specimen: Nasopharyngeal Swab  Result Value Ref Range Status   SARS Coronavirus 2 POSITIVE (A) NEGATIVE Final    Comment: RESULT CALLED TO, READ BACK BY AND VERIFIED WITH: Lowell Bouton RN 12:55 07/15/19 ([email or web address]) (NOTE) If result is NEGATIVE SARS-CoV-2 target nucleic acids are NOT DETECTED. The SARS-CoV-2 RNA is generally detectable in upper and lower  respiratory specimens during the acute phase of infection. The lowest  concentration of SARS-CoV-2 viral copies this assay can  detect is 250  copies / mL. A negative result does not preclude SARS-CoV-2 infection  and should not be used as the sole basis for treatment or other  patient management decisions.  A negative result may occur with  improper specimen collection / handling, submission of specimen other  than nasopharyngeal swab, presence of viral mutation(s) within the  areas targeted by this assay, and inadequate number of viral copies  (<250 copies / mL). A negative result must be combined with clinical  observations, patient history, and epidemiological information. If result is POSITIVE SARS-CoV-2 target nucleic acids are DETECTED. The  SARS-CoV-2 RNA is generally detectable in upper and lower  respiratory specimens during the acute phase of infection.  Positive  results are indicative of active infection with SARS-CoV-2.  Clinical  correlation with patient history and other diagnostic information is  necessary to determine patient infection status.  Positive results do  not rule out bacterial infection or co-infection with other viruses. If result is PRESUMPTIVE POSTIVE SARS-CoV-2 nucleic acids MAY BE PRESENT.   A presumptive positive result was obtained on the submitted specimen  and confirmed on repeat testing.  While 2019 novel coronavirus  (SARS-CoV-2) nucleic acids may be present in the submitted sample  additional confirmatory testing may be necessary for epidemiological  and / or clinical management purposes  to differentiate between  SARS-CoV-2 and other Sarbecovirus currently known to infect humans.  If clinically indicated additional testing with an alternate test  methodology [phone]) is a dvised. The SARS-CoV-2 RNA is generally  detectable in upper and lower respiratory specimens during the acute  phase of infection. The expected result is Negative. Fact Sheet for Patients:  StrictlyIdeas.no Fact Sheet for Healthcare Providers:  BankingDealers.co.za This test is not yet approved or cleared by the [place] FDA and has been authorized for detection and/or diagnosis of SARS-CoV-2 by FDA under an Emergency Use Authorization (EUA).  This EUA will remain in effect (meaning this test can be used) for the duration of the COVID-19 declaration under Section 564(b)(1) of the Act, 21 U.S.C. section 360bbb-3(b)(1), unless the authorization is terminated or revoked sooner. Performed at [facility], [place],  [place] [address]       Radiology Studies: No results found.  Scheduled Meds: . aspirin  81 mg Oral Daily  . docusate sodium  100 mg Oral BID  . enoxaparin (LOVENOX) injection  40 mg Subcutaneous Q24H  . finasteride  5 mg Oral Daily  . insulin aspart  0-5 Units Subcutaneous QHS  . insulin aspart  0-9 Units Subcutaneous TID WC  . methylPREDNISolone (SOLU-MEDROL) injection  40 mg Intravenous Q12H  . sodium chloride flush  3 mL Intravenous Q12H  . sodium chloride flush  3 mL Intravenous Q12H   Continuous Infusions: . sodium chloride    . remdesivir 100 mg in NS 250 mL 100 mg (07/20/19 1219)     LOS: 6 days    Phillips Climes, MD [facility] [email or web address] Password [facility] 07/21/2019, 11:52 AM

## 2019-07-22 LAB — COMPREHENSIVE METABOLIC PANEL
ALT: 75 U/L — ABNORMAL HIGH (ref 0–44)
AST: 16 U/L (ref 15–41)
Albumin: 2.3 g/dL — ABNORMAL LOW (ref 3.5–5.0)
Alkaline Phosphatase: 128 U/L — ABNORMAL HIGH (ref 38–126)
Anion gap: 11 (ref 5–15)
BUN: 36 mg/dL — ABNORMAL HIGH (ref 8–23)
CO2: 24 mmol/L (ref 22–32)
Calcium: 8.5 mg/dL — ABNORMAL LOW (ref 8.9–10.3)
Chloride: 102 mmol/L (ref 98–111)
Creatinine, Ser: 0.61 mg/dL (ref 0.61–1.24)
GFR calc Af Amer: >60 mL/min (ref >60)
GFR calc non Af Amer: >60 mL/min (ref >60)
Glucose, Bld: 163 mg/dL — ABNORMAL HIGH (ref 70–99)
Potassium: 4.1 mmol/L (ref 3.5–5.1)
Sodium: 137 mmol/L (ref 135–145)
Total Bilirubin: 0.4 mg/dL (ref 0.3–1.2)
Total Protein: 5.3 g/dL — ABNORMAL LOW (ref 6.5–8.1)

## 2019-07-22 LAB — GLUCOSE, CAPILLARY
Glucose-Capillary: 144 mg/dL — ABNORMAL HIGH (ref 70–99)
Glucose-Capillary: 302 mg/dL — ABNORMAL HIGH (ref 70–99)

## 2019-07-22 LAB — C-REACTIVE PROTEIN: CRP: 3.8 mg/dL — ABNORMAL HIGH (ref <1.0)

## 2019-07-22 MED ORDER — DEXAMETHASONE 6 MG PO TABS: 6.0000 mg | ORAL_TABLET | Freq: Two times a day (BID) | ORAL | 0 refills | Status: DC

## 2019-07-22 NOTE — Discharge Instructions (Signed)
COVID-19 COVID-19 La COVID-19 es una infeccin respiratoria causada por un virus llamado coronavirus tipo 2 causante del sndrome respiratorio agudo grave (SARS-CoV-2). La enfermedad tambin se conoce como enfermedad por coronavirus o nuevo coronavirus. En algunas personas, el virus puede no ocasionar sntomas. En otras, puede producir una infeccin grave. La infeccin puede empeorar rpidamente y causar complicaciones, como:  Neumona o infeccin en los pulmones.  Sndrome de dificultad respiratoria aguda o SDRA. Se trata de la acumulacin de lquido en los pulmones.  Insuficiencia respiratoria aguda. Se trata de una afeccin en la que no pasa suficiente oxgeno de los pulmones al cuerpo.  Sepsis o choque sptico. Se trata de una reaccin grave del cuerpo ante una infeccin.  Problemas de coagulacin.  Infecciones secundarias debido a bacterias u hongos. El virus que causa la COVID-19 es contagioso. Esto significa que puede transmitirse de una persona a otra a travs de las gotitas de saliva de la tos y de los estornudos (secreciones respiratorias). Cules son las causas? Esta enfermedad es causada por un virus. Usted puede contagiarse con este virus:  Al aspirar las gotitas que una persona infectada elimina al toser o estornudar.  Al tocar algo, como una mesa o el picaportes de una puerta, que estuvo expuesto al virus (contaminado) y luego tocarse la boca, nariz o los ojos. Qu incrementa el riesgo? Riesgo de infeccin Es ms probable que se infecte con este virus si:  Vive o viaja a una zona donde hay un brote de COVID-19.  Entra en contacto con una persona enferma que recientemente viaj a una zona con un brote de COVID-19.  Cuida o vive con una persona infectada con COVID-19. Riesgo de enfermedad grave Es ms probable que se enferme gravemente por el virus si:  Tiene 65aos o ms.  Tiene una enfermedad crnica que disminuye la capacidad del cuerpo para combatir las  infecciones (immunocomprometido).  Vive en un hogar de ancianos o centro de atencin a largo plazo.  Tiene una enfermedad prolongada (crnica), como las siguientes: ? Enfermedad pulmonar crnica, que incluye la enfermedad pulmonar obstructiva crnica o asma. ? Enfermedad cardaca. ? Diabetes. ? Enfermedad renal crnica. ? Enfermedad heptica.  Es obeso. Cules son los signos o sntomas? Los sntomas de esta afeccin pueden ser de leves a graves. Los sntomas pueden aparecer en el trmino de 2 a 14 das despus de haber estado expuesto al virus. Incluyen los siguientes:  Fiebre.  Tos.  Dificultad para respirar.  Escalofros.  Dolores musculares.  Dolor de garganta.  Prdida del gusto o el olfato. Algunas personas tambin pueden tener problemas estomacales, como nuseas, vmitos o diarrea. Es posible que otras personas no tengan sntomas de COVID-19. Cmo se diagnostica? Esta afeccin se puede diagnosticar en funcin de lo siguiente:  Sus signos y sntomas, especialmente si: ? Vive en una zona donde hay un brote de COVID-19. ? Viaj recientemente a una zona donde el virus es frecuente. ? Cuida o vive con una persona a quien se le diagnostic COVID-19.  Un examen fsico.  Anlisis de laboratorio que pueden incluir: ? Un hisopado nasal para tomar una muestra de lquido de la nariz. ? Un hisopado de garganta para tomar una muestra de lquido de la garganta. ? Una muestra de mucosidad de los pulmones (esputo). ? Anlisis de sangre.  Los estudios de diagnstico por imgenes pueden incluir radiografas, exploracin por tomografa computarizada (TC) o ecografa. Cmo se trata? En este momento, no hay ningn medicamento para tratar la COVID-19. Los medicamentos para tratar   otras enfermedades se usan a modo de ensayo para comprobar si son eficaces contra la COVID-19. El mdico le informar sobre las maneras de tratar los sntomas. En la mayora de las personas, la infeccin  es leve y puede controlarse en el hogar con reposo, lquidos y medicamentos de venta libre. El tratamiento para una infeccin grave suele realizarse en la unidad de cuidados intensivos (UCI) de un hospital. Puede incluir uno o ms de los siguientes. Estos tratamientos se administran hasta que los sntomas mejoran.  Recibir lquidos y medicamentos a travs de una va intravenosa.  Oxgeno complementario. Para administrar oxgeno extra, se utiliza un tubo en la nariz, una mascarilla o una campana de oxgeno.  Colocarlo para que se recueste boca abajo (decbito prono). Esto facilita el ingreso de oxgeno a los pulmones.  Uso continuo de una mquina de presin positiva de las vas areas (CPAP) o de presin positiva de las vas areas de dos niveles (BPAP). Este tratamiento utiliza una presin de aire leve para mantener las vas respiratorias abiertas. Un tubo conectado a un motor administra oxgeno al cuerpo.  Respirador. Este tratamiento mueve el aire dentro y fuera de los pulmones mediante el uso de un tubo que se coloca en la trquea.  Traqueostoma. En este procedimiento se hace un orificio en el cuello para insertar un tubo de respiracin.  Oxigenacin por membrana extracorprea (OMEC). En este procedimiento, los pulmones tienen la posibilidad de recuperarse al asumir las funciones del corazn y los pulmones. Suministra oxgeno al cuerpo y elimina el dixido de carbono. Siga estas instrucciones en su casa: Estilo de vida  Si est enfermo, qudese en su casa, excepto para obtener atencin mdica. El mdico le indicar cunto tiempo debe quedarse en casa. Llame al mdico antes de buscar atencin mdica.  Haga reposo en su casa como se lo haya indicado el mdico.  No consuma ningn producto que contenga nicotina o tabaco, como cigarrillos, cigarrillos electrnicos y tabaco de mascar. Si necesita ayuda para dejar de fumar, consulte al mdico.  Retome sus actividades normales como se lo haya  indicado el mdico. Pregntele al mdico qu actividades son seguras para usted. Instrucciones generales  Use los medicamentos de venta libre y los recetados solamente como se lo haya indicado el mdico.  Beba suficiente lquido como para mantener la orina de color amarillo plido.  Concurra a todas las visitas de seguimiento como se lo haya indicado el mdico. Esto es importante. Cmo se evita?  No hay ninguna vacuna que ayude a prevenir la infeccin por la COVID-19. Sin embargo, hay medidas que puede tomar para protegerse y proteger a otras personas de este virus. Para protegerse:   No viaje a zonas donde la COVID-19 sea un riesgo. Las zonas donde se informa la presencia de la COVID-19 cambian con frecuencia. Para identificar las zonas de alto riesgo y las restricciones de viaje, consulte el sitio web de viajes de los Centers for Disease Control and Prevention (CDC) (Centros para el Control y la Prevencin de Enfermedades): wwwnc.cdc.gov/travel/notices  Si vive o debe viajar a una zona donde COVID-19 es un riesgo, tome precauciones para evitar infecciones. ? Aljese de las personas enfermas. ? Lvese las manos frecuentemente con agua y jabn durante 20segundos. Use desinfectante para manos con alcohol si no dispone de agua y jabn. ? Evite tocarse la boca, la cara, los ojos o la nariz. ? Evite salir de su casa, siga las indicaciones de su estado y de las autoridades sanitarias locales. ? Si debe   salir de su casa, use un barbijo de tela o una mascarilla facial. ? Desinfecte los objetos y las superficies que se tocan con frecuencia todos los das. Pueden incluir:  Encimeras y mesas.  Picaportes e interruptores de luz.  Lavabos, fregaderos y grifos.  Aparatos electrnicos tales como telfonos, controles remotos, teclados, computadoras y tabletas. Cmo proteger a los dems: Si tiene sntomas de la COVID-19, tome medidas para evitar que el virus se propague a otras personas.  Si cree  que tiene una infeccin por la COVID-19, comunquese de inmediato con su mdico. Informe al equipo de atencin mdica que cree que puede tener una infeccin por la COVID-19.  Qudese en su casa. Salga de su casa solo para buscar atencin mdica. No utilice el transporte pblico.  No viaje mientras est enfermo.  Lvese las manos frecuentemente con agua y jabn durante 20segundos. Usar desinfectante para manos con alcohol si no dispone de agua y jabn.  Mantngase alejado de quienes vivan con usted. Permita que los miembros de la familia sanos cuiden a los nios y las mascotas, si es posible. Si tiene que cuidar a los nios o las mascotas, lvese las manos con frecuencia y use un barbijo. Si es posible, permanezca en su habitacin, separado de los dems. Utilice un bao diferente.  Asegrese de que todas las personas que viven en su casa se laven bien las manos y con frecuencia.  Tosa o estornude en un pauelo de papel o sobre su manga o codo. No tosa o estornude al aire ni se cubra la boca o la nariz con la mano.  Use un barbijo de tela o una mascarilla facial. Dnde buscar ms informacin  Centers for Disease Control and Prevention (Centros para el Control y la Prevencin de Enfermedades): www.cdc.gov/coronavirus/2019-ncov/index.html  World Health Organization (Organizacin Mundial de la Salud): www.who.int/health-topics/coronavirus Comunquese con un mdico si:  Vive o ha viajado a una zona donde la COVID-19 es un riesgo y tiene sntomas de infeccin.  Ha tenido contacto con alguien que tiene COVID-19 y usted tiene sntomas de infeccin. Solicite ayuda de inmediato si:  Tiene dificultad para respirar.  Siente dolor u opresin en el pecho.  Experimenta confusin.  Tiene las uas de los dedos y los labios de color azulado.  Tiene dificultad para despertarse.  Los sntomas empeoran. Estos sntomas pueden representar un problema grave que constituye una emergencia. No espere a  ver si los sntomas desaparecen. Solicite atencin mdica de inmediato. Comunquese con el servicio de emergencias de su localidad (911 en los [place]). No conduzca por sus propios medios hasta el hospital. Informe al personal mdico de emergencias si cree que tiene COVID-19. Resumen  La COVID-19 es una infeccin respiratoria causada por un virus. Tambin se conoce como enfermedad por coronavirus o nuevo coronavirus. Puede causar infecciones graves, como neumona, sndrome de dificultad respiratoria aguda, insuficiencia respiratoria aguda o sepsis.  El virus que causa la COVID-19 es contagioso. Esto significa que puede transmitirse de una persona a otra a travs de las gotitas de saliva de la tos y de los estornudos.  Es ms probable que desarrolle una enfermedad grave si tiene 65 aos o ms, tiene un sistema inmunitario dbil, vive en un hogar de ancianos o tiene enfermedad crnica.  No hay ningn medicamento para tratar la COVID-19. El mdico le informar sobre las maneras de tratar los sntomas.  Tome medidas para protegerse y proteger a los dems contra las infecciones. Lvese las manos con frecuencia y desinfecte los objetos y   las superficies que se tocan con frecuencia todos los das. Mantngase alejado de las personas que estn enfermas y use un barbijo si est enfermo. Esta informacin no tiene como fin reemplazar el consejo del mdico. Asegrese de hacerle al mdico cualquier pregunta que tenga. Document Released: 01/23/2019 Document Revised: 04/27/2019 Document Reviewed: 01/23/2019 [employer]vier Patient Education  2020 [employer]  

## 2019-07-22 NOTE — Plan of Care (Signed)
  Problem: Education: Goal: Knowledge of General Education information will improve Description: Including pain rating scale, medication(s)/side effects and non-pharmacologic comfort measures Outcome: Progressing   Problem: Health Behavior/Discharge Planning: Goal: Ability to manage health-related needs will improve Outcome: Progressing   Problem: Clinical Measurements: Goal: Ability to maintain clinical measurements within normal limits will improve Outcome: Progressing Goal: Will remain free from infection Outcome: Progressing Goal: Diagnostic test results will improve Outcome: Progressing Goal: Respiratory complications will improve Outcome: Progressing   Problem: Activity: Goal: Risk for activity intolerance will decrease Outcome: Progressing   Problem: Education: Goal: Knowledge of risk factors and measures for prevention of condition will improve Outcome: Progressing   Problem: Coping: Goal: Psychosocial and spiritual needs will be supported Outcome: Progressing   Problem: Respiratory: Goal: Will maintain a patent airway Outcome: Progressing Goal: Complications related to the disease process, condition or treatment will be avoided or minimized Outcome: Progressing

## 2019-07-22 NOTE — Discharge Summary (Signed)
[address] Jim Tucker, is a 78 y.o. male  DOB 02-Sep-1941  MRN [id number].  Admission date:  07/15/2019  Admitting Physician  Karmen Bongo, MD  Discharge Date:  07/22/2019   Primary MD  Gregor Hams, MD  Recommendations for primary care physician for things to follow:  -Please check CBC, CMP during next visit -Statin on hold on discharge when elevated LFTs, resume once stable.   Admission Diagnosis  Acute respiratory disease due to COVID-19 virus [U07.1, J06.9]   Discharge Diagnosis  Acute respiratory disease due to COVID-19 virus [U07.1, J06.9]    Principal Problem:   Respiratory tract infection due to COVID-19 virus Active Problems:   Hyperlipidemia   BPH (benign prostatic hyperplasia)   HTN (hypertension)   Prediabetes      Past Medical History:  Diagnosis Date  . Arthritis    shoulders, back  . BPH (benign prostatic hypertrophy)    takes Proscar daily  . Diverticulosis   . History of colon polyps   . Hyperlipidemia    takes Atorvastatin daily  . Internal hemorrhoid   . LBP (low back pain)    HNP  . Nocturia   . Numbness and tingling    in right leg    Past Surgical History:  Procedure Laterality Date  . BACK SURGERY    . COLONOSCOPY    . CYSTOSCOPY WITH BIOPSY N/A 07/19/2013   Procedure: TRANSURETHRAL RESECTION OF BLADDER NECK;  Surgeon: Bernestine Amass, MD;  Location: [facility];  Service: Urology;  Laterality: N/A;  . LUMBAR LAMINECTOMY Right 08/12/2014   Procedure: Right L2-3 Hemilaminectomy, Removal HNP;  Surgeon: Marybelle Killings, MD;  Location: [place];  Service: Orthopedics;  Laterality: Right;  . LUMBAR SPINE SURGERY     x 2  . NASAL SINUS SURGERY  40 yrs ago  . PROSTATE BIOPSY  2007, 2010   benign.  Urology - Dr. Ellwood Sayers  . ROTATOR CUFF REPAIR Bilateral    total of 3  . TRANSURETHRAL RESECTION OF PROSTATE  10-23-2009       History of present illness and   Hospital Course:     Kindly see H&P for history of present illness and admission details, please review complete Labs, Consult reports and Test reports for all details in brief  HPI  from the history and physical done on the day of admission 07/15/2019  HPI: Jim Tucker is a 78 y.o. male with medical history significant of low back pain; HLD; and BPH presenting with SOB and myalgias.  Patient went to [facility] for 4 days last week, returning Saturday.  He was visiting his niece.  He reports being tested while there, despite being asymptomatic, and being negative.  He began feeling bad shortly after his plane landed on Saturday with chest fullness, SOB.  This has progressed.  Denies significant cough - although he had productive cough as I entered the room.  Denies fever.  +mylagias.  +loss of taste/smell.  He is worried that he waited too long and  is going to die.  His wife is currently at work (she works in a place that [employer]).   Jim Tucker is a 78 y.o. male with a history of HLD, prediabetes, HTN, BPH and orthopedic surgeries who presented to the ED 8/6 with muscle aches and progressive shortness of breath since 8/1, the day he arrived by plane from [place]. This was associated with poor appetite. On evaluation he was hypoxic and afebrile with negative CXR. Inflammatory markers were grossly elevated (CRP 18.7, ferritin 1,855, LDH 446, fibrinogen >800, and d-dimer 2.14), and SARS-CoV-2 swab was positive. LFTs were elevated (AST 236, ALT 263, TBili 1.6, Alk Phos 198). Steroids were started and the patient was admitted to [facility]. Hypoxia continued with GGOs on CTA of the chest which also ruled out PE. After LFTs showed improvement, remdesivir was started 8/7, though after 2 doses had to be held. Hypoxia continues and LFTs have again improved, so remdesivir is to be restarted 8/11.   Acute hypoxic respiratory failure due to covid-19 pneumonia -  Developed widespread GGOs on CT  chest 24 hours after negative CXR with severely elevated inflammatory markers (CRP 19), entering 2nd week of illness.  Requiring oxygen on presentation, inflammatory markers significantly elevated, he was started on IV Solu-Medrol, as well treated with IV Remdesivir which has been held briefly secondary to elevated LFTs, but overall finish total of 5 days of Remdesivir, he will be discharged on another 2 days of oral Decadron to finish total of 10 days of steroids. -Hypoxia is improving,  - No PE on CTA chest.  - Received 2 days of remdesivir (8/7 - 8/9; abbreviated course due to LFT elevation ALT >5x ULN). Though LFTs have improved and he remains hypoxi he was restarted on Remdesivir (would act as day 3 of 5) >> C for total of 5 days.  -DVT prophylaxis per COVID-19 protocol -He was encouraged to use incentive spirometry  LFT elevation: -Most likely due to COVID-19, trending down. - Holding statin   BPH: s/p TURP - Continue finasteride  HLD:  - Hold statin with LFT elevation  Prediabetes: HbA1c 6.3%. Anticipate steroid-induced hyperglycemia.     Discharge Condition:  stable   Follow UP  Follow-up Information    Gregor Hams, MD Follow up in 1 week(s).   Specialties: Family Medicine, Emergency Medicine Contact information: [address] [place] [address] [phone]             Discharge Instructions  and  Discharge Medications     Discharge Instructions    Diet - low sodium heart healthy   Complete by: As directed    Discharge instructions   Complete by: As directed    Follow with Primary MD Gregor Hams, MD in 7 days   Get CBC, CMP,  checked  by Primary MD next visit.    Activity: As tolerated with Full fall precautions use walker/cane & assistance as needed   Disposition Home    Diet: Heart Healthy   For Heart failure patients - Check your Weight same time everyday, if you gain over 2 pounds, or you develop in leg swelling,  experience more shortness of breath or chest pain, call your Primary MD immediately. Follow Cardiac Low Salt Diet and 1.5 lit/day fluid restriction.   On your next visit with your primary care physician please Get Medicines reviewed and adjusted.   Please request your Prim.MD to go over all Hospital Tests and Procedure/Radiological results  at the follow up, please get all Hospital records sent to your Prim MD by signing hospital release before you go home.   If you experience worsening of your admission symptoms, develop shortness of breath, life threatening emergency, suicidal or homicidal thoughts you must seek medical attention immediately by calling 911 or calling your MD immediately  if symptoms less severe.  You Must read complete instructions/literature along with all the possible adverse reactions/side effects for all the Medicines you take and that have been prescribed to you. Take any new Medicines after you have completely understood and accpet all the possible adverse reactions/side effects.   Do not drive, operating heavy machinery, perform activities at heights, swimming or participation in water activities or provide baby sitting services if your were admitted for syncope or siezures until you have seen by Primary MD or a Neurologist and advised to do so again.  Do not drive when taking Pain medications.    Do not take more than prescribed Pain, Sleep and Anxiety Medications  Special Instructions: If you have smoked or chewed Tobacco  in the last 2 yrs please stop smoking, stop any regular Alcohol  and or any Recreational drug use.  Wear Seat belts while driving.   Please note  You were cared for by a hospitalist during your hospital stay. If you have any questions about your discharge medications or the care you received while you were in the hospital after you are discharged, you can call the unit and asked to speak with the hospitalist on call if the hospitalist that took  care of you is not available. Once you are discharged, your primary care physician will handle any further medical issues. Please note that NO REFILLS for any discharge medications will be authorized once you are discharged, as it is imperative that you return to your primary care physician (or establish a relationship with a primary care physician if you do not have one) for your aftercare needs so that they can reassess your need for medications and monitor your lab values.   Increase activity slowly   Complete by: As directed      Allergies as of 07/22/2019      Reactions   Other Other (See Comments)   Raw Apples swells throat      Medication List    STOP taking these medications   atorvastatin 80 MG tablet Commonly known as: LIPITOR   ibuprofen 200 MG tablet Commonly known as: ADVIL     TAKE these medications   dexamethasone 6 MG tablet Commonly known as: DECADRON Take 1 tablet (6 mg total) by mouth 2 (two) times daily. Take for 2 days   finasteride 5 MG tablet Commonly known as: PROSCAR Take 1 tablet (5 mg total) by mouth daily.         Diet and Activity recommendation: See Discharge Instructions above  Consults obtained -  None   Major procedures and Radiology Reports - PLEASE review detailed and final reports for all details, in brief -      Ct Angio Chest Pe W Or Wo Contrast  Result Date: 07/16/2019 CLINICAL DATA:  Shortness of breathw1 EXAM: CT ANGIOGRAPHY CHEST WITH CONTRAST TECHNIQUE: Multidetector CT imaging of the chest was performed using the standard protocol during bolus administration of intravenous contrast. Multiplanar CT image reconstructions and MIPs were obtained to evaluate the vascular anatomy. CONTRAST:  66m OMNIPAQUE IOHEXOL 350 MG/ML SOLN COMPARISON:  Chest radiograph July 15, 2019 FINDINGS: Cardiovascular: There is no demonstrable  pulmonary embolus. There is no thoracic aortic aneurysm or dissection. There are scattered foci of calcification  in the visualized great vessels. Note that the right innominate and left common carotid arteries arise as a common trunk, an anatomic variant. There is aortic atherosclerosis. There is no pericardial effusion or pericardial thickening. There are occasional foci of coronary artery calcification. Mediastinum/Nodes: Thyroid appears unremarkable. There are scattered subcentimeter mediastinal lymph nodes. No adenopathy by size criteria evident. There is a small hiatal hernia. Lungs/Pleura: There are ground-glass opacities diffusely throughout the lungs bilaterally, most pronounced in the lower lobes but fairly extensive in the lingula and right middle lobe and present to varying degrees in the upper lobes, primarily in the posterior segment right upper lobe. On axial slice 32 series 6, there is a 5 mm nodular opacity in the posterior segment right upper lobe. No pleural effusions are evident. Upper Abdomen: Stomach is filled with fluid and food material. There is incomplete visualization of a cyst arising from the upper portion of the left kidney measuring 2.6 x 2.5 cm. Visualized upper abdominal structures otherwise appear unremarkable. Musculoskeletal: No blastic or lytic bone lesions. No evident chest wall lesions. Review of the MIP images confirms the above findings. IMPRESSION: 1. No demonstrable pulmonary embolus. No thoracic aortic aneurysm or dissection. There is aortic atherosclerosis as well as foci of great vessel and to a lesser extent coronary artery calcification. 2. Ground-glass type opacity throughout the lungs fairly diffusely, consistent with widespread pneumonia. Suspect atypical organism pneumonia including viral type pneumonia as a major differential consideration given this appearance. 3.  No adenopathy evident by size criteria. 4.  Small hiatal hernia. Aortic Atherosclerosis (ICD10-I70.0). Electronically Signed   By: Lowella Grip III M.D.   On: 07/16/2019 13:35   Dg Chest Port 1 View   Result Date: 07/15/2019 CLINICAL DATA:  Shortness of breath EXAM: PORTABLE CHEST 1 VIEW COMPARISON:  08/10/2014 FINDINGS: Heart size is within normal limits. Calcific aortic knob. No focal airspace consolidation, pleural effusion, or pneumothorax. IMPRESSION: No acute cardiopulmonary findings. Electronically Signed   By: Davina Poke M.D.   On: 07/15/2019 11:55    Micro Results    Recent Results (from the past 240 hour(s))  Blood Culture (routine x 2)     Status: None   Collection Time: 07/15/19 10:42 AM   Specimen: BLOOD  Result Value Ref Range Status   Specimen Description BLOOD RIGHT ANTECUBITAL  Final   Special Requests   Final    BOTTLES DRAWN AEROBIC AND ANAEROBIC Blood Culture results may not be optimal due to an excessive volume of blood received in culture bottles   Culture   Final    NO GROWTH 5 DAYS Performed at [facility], [place] [address], [place], [place] [address]    Report Status 07/20/2019 FINAL  Final  Blood Culture (routine x 2)     Status: None   Collection Time: 07/15/19 10:43 AM   Specimen: BLOOD RIGHT HAND  Result Value Ref Range Status   Specimen Description BLOOD RIGHT HAND  Final   Special Requests   Final    BOTTLES DRAWN AEROBIC AND ANAEROBIC Blood Culture results may not be optimal due to an excessive volume of blood received in culture bottles   Culture   Final    NO GROWTH 5 DAYS Performed at [facility], [place] [address] [place], [place] [address]    Report Status 07/20/2019 FINAL  Final  SARS Coronavirus 2 [facility] order, Performed  in [place] lab) Nasopharyngeal Nasopharyngeal Swab     Status: Abnormal   Collection Time: 07/15/19 10:45 AM   Specimen: Nasopharyngeal Swab  Result Value Ref Range Status   SARS Coronavirus 2 POSITIVE (A) NEGATIVE Final    Comment: RESULT CALLED TO, READ BACK BY AND VERIFIED WITH: Lowell Bouton RN 12:55 07/15/19 ([email or web address]) (NOTE) If result is NEGATIVE SARS-CoV-2 target nucleic acids are NOT  DETECTED. The SARS-CoV-2 RNA is generally detectable in upper and lower  respiratory specimens during the acute phase of infection. The lowest  concentration of SARS-CoV-2 viral copies this assay can detect is 250  copies / mL. A negative result does not preclude SARS-CoV-2 infection  and should not be used as the sole basis for treatment or other  patient management decisions.  A negative result may occur with  improper specimen collection / handling, submission of specimen other  than nasopharyngeal swab, presence of viral mutation(s) within the  areas targeted by this assay, and inadequate number of viral copies  (<250 copies / mL). A negative result must be combined with clinical  observations, patient history, and epidemiological information. If result is POSITIVE SARS-CoV-2 target nucleic acids are DETECTED. The  SARS-CoV-2 RNA is generally detectable in upper and lower  respiratory specimens during the acute phase of infection.  Positive  results are indicative of active infection with SARS-CoV-2.  Clinical  correlation with patient history and other diagnostic information is  necessary to determine patient infection status.  Positive results do  not rule out bacterial infection or co-infection with other viruses. If result is PRESUMPTIVE POSTIVE SARS-CoV-2 nucleic acids MAY BE PRESENT.   A presumptive positive result was obtained on the submitted specimen  and confirmed on repeat testing.  While 2019 novel coronavirus  (SARS-CoV-2) nucleic acids may be present in the submitted sample  additional confirmatory testing may be necessary for epidemiological  and / or clinical management purposes  to differentiate between  SARS-CoV-2 and other Sarbecovirus currently known to infect humans.  If clinically indicated additional testing with an alternate test  methodology [id number]) is a dvised. The SARS-CoV-2 RNA is generally  detectable in upper and lower respiratory specimens during  the acute  phase of infection. The expected result is Negative. Fact Sheet for Patients:  StrictlyIdeas.no Fact Sheet for Healthcare Providers: BankingDealers.co.za This test is not yet approved or cleared by the [place] FDA and has been authorized for detection and/or diagnosis of SARS-CoV-2 by FDA under an Emergency Use Authorization (EUA).  This EUA will remain in effect (meaning this test can be used) for the duration of the COVID-19 declaration under Section 564(b)(1) of the Act, 21 U.S.C. section 360bbb-3(b)(1), unless the authorization is terminated or revoked sooner. Performed at [facility], [place] [address], [place],  [address]        Today   Subjective:   [facility] today has no headache,no chest abdominal pain,no new weakness tingling or numbness, ambulated this morning in the hallway with staff, with lowest oxygen saturation at 93% on room air  Objective:   Blood pressure 119/61, pulse (!) 53, temperature 98.1 F (36.7 C), temperature source Oral, resp. rate 18, height _0  (1.575 m), weight 54.4 kg, SpO2 93 %.   Intake/Output Summary (Last 24 hours) at 07/22/2019 1128 Last data filed at 07/22/2019 0949 Gross per 24 hour  Intake 1213 ml  Output 1051 ml  Net 162 ml    Exam Awake Alert, Oriented x 3, No new  F.N deficits, Normal affect Symmetrical Chest wall movement, Good air movement bilaterally, CTAB RRR,No Gallops,Rubs or new Murmurs, No Parasternal Heave +ve B.Sounds, Abd Soft, Non tender , No rebound -guarding or rigidity. No Cyanosis, Clubbing or edema, No new Rash or bruise  Data Review   CBC w Diff:  Lab Results  Component Value Date   WBC 16.7 (H) 07/21/2019   HGB 13.1 07/21/2019   HCT 39.9 07/21/2019   PLT 281 07/21/2019   LYMPHOPCT 3 07/21/2019   MONOPCT 4 07/21/2019   EOSPCT 0 07/21/2019   BASOPCT 0 07/21/2019    CMP:  Lab Results  Component Value Date   NA 137  07/22/2019   K 4.1 07/22/2019   CL 102 07/22/2019   CO2 24 07/22/2019   BUN 36 (H) 07/22/2019   CREATININE 0.61 07/22/2019   CREATININE 0.98 08/12/2018   PROT 5.3 (L) 07/22/2019   ALBUMIN 2.3 (L) 07/22/2019   BILITOT 0.4 07/22/2019   ALKPHOS 128 (H) 07/22/2019   AST 16 07/22/2019   ALT 75 (H) 07/22/2019  .   Total Time in preparing paper work, data evaluation and todays exam - 34 minutes  Phillips Climes M.D on 07/22/2019 at 11:28 AM  [facility]   Office  [phone]

## 2019-07-22 NOTE — Progress Notes (Signed)
Pt discharged home at 5pm, take to front of hospital via w/c to meet his wife.  Pt did not go home on O2 due to fact that when he walked he did not meet criteria for O2.

## 2019-07-26 ENCOUNTER — Ambulatory Visit: Payer: Medicare Other | Admitting: Orthopedic Surgery

## 2019-07-26 ENCOUNTER — Ambulatory Visit (INDEPENDENT_AMBULATORY_CARE_PROVIDER_SITE_OTHER): Payer: Medicare Other | Admitting: Family Medicine

## 2019-07-26 ENCOUNTER — Telehealth: Payer: Self-pay

## 2019-07-26 DIAGNOSIS — U071 COVID-19: Secondary | ICD-10-CM | POA: Diagnosis not present

## 2019-07-26 DIAGNOSIS — J1282 Pneumonia due to coronavirus disease 2019: Secondary | ICD-10-CM

## 2019-07-26 DIAGNOSIS — J1289 Other viral pneumonia: Secondary | ICD-10-CM | POA: Diagnosis not present

## 2019-07-26 MED ORDER — DEXAMETHASONE 6 MG PO TABS: 6.0000 mg | ORAL_TABLET | Freq: Two times a day (BID) | ORAL | 0 refills | Status: DC

## 2019-07-26 NOTE — Progress Notes (Signed)
  Virtual Visit  I connected with      Jim Tucker  by a telemedicine application and verified that I am speaking with the correct person using two identifiers.   I discussed the limitations of evaluation and management by telemedicine and the availability of in person appointments. The patient expressed understanding and agreed to proceed.  History of Present Illness: Jim Tucker is a 78 y.o. male who would like to discuss hospital follow-up.  Patient was admitted on the hospital from August 6 to August 13.  He was admitted due to acute respiratory disease due to COVID-19.  Complicated also by his chronic medical problems including hypertension prediabetes etc.  He been symptomatic since August 1.  He had worsened.  He had significantly abnormal inflammatory markers including elevated fibrinogen AST ALT alk phos CRP and d-dimer.  He was started on steroids and admitted to [facility].  He was started on remdesivir once LFTs improved.  He improved and never required intubation.  He was discharged with a 2-day course of dexamethasone . In the interim he notes that after he finished his dexamethasone course he started having more fatigue and shortness of breath.  He notes the chest pain has continued throughout his hospitalization and following his hospitalization.  He notes a little bit of chest pain with deep inspiration and with cough.  Overall he feels about the same as he did when he left the hospital does not better.  He thinks the dexamethasone was very helpful and would like to extend it or continue it if possible.  Per discharge instructions recommend recheck CBC and CMP.     Observations/Objective: There were no vitals taken for this visit. Wt Readings from Last 5 Encounters:  07/15/19 120 lb (54.4 kg)  07/15/19 130 lb (59 kg)  08/21/18 137 lb (62.1 kg)  08/12/18 137 lb 11.2 oz (62.5 kg)  08/11/18 136 lb 6.4 oz (61.9 kg)   Exam: Normal Speech.  No severe  tachypnea.  Able to complete sentences.  No wheezing.  No frequent cough.  Alert and oriented.  Lab and Radiology Results Hospital records including recent labs and imaging findings reviewed   Assessment and Plan: 78 y.o. male with hospital follow-up.  Patient was hospitalized for COVID-19 related pneumonia.  He was discharged on the 13th.  In the interim he continues to struggle.  He felt like he was a little bit better on dexamethasone and would like to restart it.  I think this is reasonable.  However he continues to have some tachypnea and chest pain.  We discussed plan.  Plan to restart dexamethasone and recheck via phone call visit/video visit in 3 days.  In the interim if he worsens recommend go back to ED for further recheck and reevaluation.  Obviously my ability to reevaluate this patient in person is somewhat limited given his COVID-19 positive status.  Would like to wait for a few more days after his hospital discharge before we think it is reasonably likely that he no longer is infectious.  PDMP not reviewed this encounter. No orders of the defined types were placed in this encounter.  Meds ordered this encounter  Medications  . dexamethasone (DECADRON) 6 MG tablet    Sig: Take 1 tablet (6 mg total) by mouth 2 (two) times daily.    Dispense:  14 tablet    Refill:  0    Follow Up Instructions:    I discussed the assessment and treatment plan with   the patient. The patient was provided an opportunity to ask questions and all were answered. The patient agreed with the plan and demonstrated an understanding of the instructions.   The patient was advised to call back or seek an in-person evaluation if the symptoms worsen or if the condition fails to improve as anticipated.  Time: 25 minutes of intraservice time, with >39 minutes of total time during today's visit.      Historical information moved to improve visibility of documentation.  Past Medical History:  Diagnosis Date   . Arthritis    shoulders, back  . BPH (benign prostatic hypertrophy)    takes Proscar daily  . Diverticulosis   . History of colon polyps   . Hyperlipidemia    takes Atorvastatin daily  . Internal hemorrhoid   . LBP (low back pain)    HNP  . Nocturia   . Numbness and tingling    in right leg   Past Surgical History:  Procedure Laterality Date  . BACK SURGERY    . COLONOSCOPY    . CYSTOSCOPY WITH BIOPSY N/A 07/19/2013   Procedure: TRANSURETHRAL RESECTION OF BLADDER NECK;  Surgeon: Bernestine Amass, MD;  Location: [facility];  Service: Urology;  Laterality: N/A;  . LUMBAR LAMINECTOMY Right 08/12/2014   Procedure: Right L2-3 Hemilaminectomy, Removal HNP;  Surgeon: Marybelle Killings, MD;  Location: [facility];  Service: Orthopedics;  Laterality: Right;  . LUMBAR SPINE SURGERY     x 2  . NASAL SINUS SURGERY  40 yrs ago  . PROSTATE BIOPSY  2007, 2010   benign.  Urology - Dr. Ellwood Sayers  . ROTATOR CUFF REPAIR Bilateral    total of 3  . TRANSURETHRAL RESECTION OF PROSTATE  10-23-2009   Social History   Tobacco Use  . Smoking status: Former Research scientist (life sciences)  . Smokeless tobacco: Never Used  . Tobacco comment: quit smoking in Mar 2015  Substance Use Topics  . Alcohol use: Yes    Comment: wine occasionally   family history includes Stomach cancer in his brother.  Medications: Current Outpatient Medications  Medication Sig Dispense Refill  . dexamethasone (DECADRON) 6 MG tablet Take 1 tablet (6 mg total) by mouth 2 (two) times daily. 14 tablet 0  . finasteride (PROSCAR) 5 MG tablet Take 1 tablet (5 mg total) by mouth daily. 180 tablet 1   No current facility-administered medications for this visit.    Allergies  Allergen Reactions  . Other Other (See Comments)     Raw Apples swells throat

## 2019-07-26 NOTE — Telephone Encounter (Signed)
[facility] called stating the only Dexamethasone they have in stock is the 2 mg and wanted OK to change RX from one 6mg  tablet to 3 of the 2 mg tablets  OK given to Vladimir Faster to change, FYI to PCP

## 2019-07-27 NOTE — Telephone Encounter (Signed)
Okay to change? 

## 2019-07-29 ENCOUNTER — Ambulatory Visit (INDEPENDENT_AMBULATORY_CARE_PROVIDER_SITE_OTHER): Payer: Medicare Other | Admitting: Family Medicine

## 2019-07-29 DIAGNOSIS — U071 COVID-19: Secondary | ICD-10-CM

## 2019-07-29 DIAGNOSIS — G4700 Insomnia, unspecified: Secondary | ICD-10-CM

## 2019-07-29 DIAGNOSIS — J1289 Other viral pneumonia: Secondary | ICD-10-CM

## 2019-07-29 MED ORDER — TRAZODONE HCL 50 MG PO TABS: 25.0000 mg | ORAL_TABLET | Freq: Every evening | ORAL | 3 refills | Status: DC | PRN

## 2019-07-29 NOTE — Progress Notes (Signed)
Virtual Visit  I connected with      [facility]  by a telemedicine application and verified that I am speaking with the correct person using two identifiers.   I discussed the limitations of evaluation and management by telemedicine and the availability of in person appointments. The patient expressed understanding and agreed to proceed.  History of Present Illness: Joeseph Gehring is a 78 y.o. male who would like to discuss shortness of breath and chest pain following hospitalization.  Patient was recently discharged from the hospital for COVID-19.  He was admitted from August 6 to the 13th.  I saw him for a phone/video visit on the 17th.  At that time he was experiencing some chest pain or shortness of breath.  He had been discharged with dexamethasone and his symptoms worsened a bit when he stopped the dexamethasone.  We restarted the dexamethasone and schedule him for a recheck today.  He is feeling a lot better on the dexamethasone.  His breathing is improving and his chest pain is improving.  He denies severe cough or severe shortness of breath but does note some shortness of breath continuing especially with exertion.  He also notes he is having some insomnia.  In the hospital he was given a medicine is not sure what that helped a lot.  He is tried some over-the-counter Tylenol PM which did not help very much.  It also caused some urinary frequency especially at bedtime.  He denies severe abdominal pain or lack of ability to urinate.     Observations/Objective: There were no vitals taken for this visit. Wt Readings from Last 5 Encounters:  07/15/19 120 lb (54.4 kg)  07/15/19 130 lb (59 kg)  08/21/18 137 lb (62.1 kg)  08/12/18 137 lb 11.2 oz (62.5 kg)  08/11/18 136 lb 6.4 oz (61.9 kg)   Exam: Normal Speech.  No tachypnea or wheezing.  Able to complete sentences.  Lab and Radiology Results No results found for this or any previous visit (from the past 72 hour(s)). No  results found.   Assessment and Plan: 78 y.o. male with shortness of breath and some chest pain following recent COVID-19 pneumonia.  Much better restarting dexamethasone.  We will continue dexamethasone for a 7-day course as prescribed.  Plan on checking back in person in 1 week.  At that point his risk of continued COVID-19 positivity will be extremely diminished and likely will be safe for outpatient care.  Will need follow-up metabolic testing with recent elevated liver enzymes in hospital.  Additionally patient is experiencing insomnia.  This is likely secondary to the dexamethasone.  Plan to use trazodone.  Based on some review of hospital records as probably what he was given.  Avoid Tylenol PM.  I believe his urinary frequency is probably because of some urinary retention from the anticholinergic properties.  Recheck near future.  Precautions reviewed.  [place] Kitchen  PDMP not reviewed this encounter. No orders of the defined types were placed in this encounter.  No orders of the defined types were placed in this encounter.   Follow Up Instructions:    I discussed the assessment and treatment plan with the patient. The patient was provided an opportunity to ask questions and all were answered. The patient agreed with the plan and demonstrated an understanding of the instructions.   The patient was advised to call back or seek an in-person evaluation if the symptoms worsen or if the condition fails to improve as anticipated.  Time: 15 minutes of intraservice time, with >22 minutes of total time during today's visit.      Historical information moved to improve visibility of documentation.  Past Medical History:  Diagnosis Date  . Arthritis    shoulders, back  . BPH (benign prostatic hypertrophy)    takes Proscar daily  . Diverticulosis   . History of colon polyps   . Hyperlipidemia    takes Atorvastatin daily  . Internal hemorrhoid   . LBP (low back pain)    HNP  . Nocturia   .  Numbness and tingling    in right leg   Past Surgical History:  Procedure Laterality Date  . BACK SURGERY    . COLONOSCOPY    . CYSTOSCOPY WITH BIOPSY N/A 07/19/2013   Procedure: TRANSURETHRAL RESECTION OF BLADDER NECK;  Surgeon: Bernestine Amass, MD;  Location: [facility];  Service: Urology;  Laterality: N/A;  . LUMBAR LAMINECTOMY Right 08/12/2014   Procedure: Right L2-3 Hemilaminectomy, Removal HNP;  Surgeon: Marybelle Killings, MD;  Location: [facility];  Service: Orthopedics;  Laterality: Right;  . LUMBAR SPINE SURGERY     x 2  . NASAL SINUS SURGERY  40 yrs ago  . PROSTATE BIOPSY  2007, 2010   benign.  Urology - Dr. Ellwood Sayers  . ROTATOR CUFF REPAIR Bilateral    total of 3  . TRANSURETHRAL RESECTION OF PROSTATE  10-23-2009   Social History   Tobacco Use  . Smoking status: Former Research scientist (life sciences)  . Smokeless tobacco: Never Used  . Tobacco comment: quit smoking in Mar 2015  Substance Use Topics  . Alcohol use: Yes    Comment: wine occasionally   family history includes Stomach cancer in his brother.  Medications: Current Outpatient Medications  Medication Sig Dispense Refill  . dexamethasone (DECADRON) 6 MG tablet Take 1 tablet (6 mg total) by mouth 2 (two) times daily. 14 tablet 0  . finasteride (PROSCAR) 5 MG tablet Take 1 tablet (5 mg total) by mouth daily. 180 tablet 1   No current facility-administered medications for this visit.    Allergies  Allergen Reactions  . Other Other (See Comments)     Raw Apples swells throat

## 2019-08-03 ENCOUNTER — Other Ambulatory Visit: Payer: Self-pay | Admitting: Family Medicine

## 2019-08-03 NOTE — Telephone Encounter (Signed)
Will hold off on more dexamethasone until follow-up on Thursday.

## 2019-08-05 ENCOUNTER — Other Ambulatory Visit: Payer: Self-pay

## 2019-08-05 ENCOUNTER — Ambulatory Visit (INDEPENDENT_AMBULATORY_CARE_PROVIDER_SITE_OTHER): Payer: Medicare Other | Admitting: Family Medicine

## 2019-08-05 ENCOUNTER — Encounter: Payer: Self-pay | Admitting: Family Medicine

## 2019-08-05 VITALS — BP 112/75 | HR 88 | Temp 96.0°F | Ht 65.0 in | Wt 117.0 lb

## 2019-08-05 DIAGNOSIS — Z23 Encounter for immunization: Secondary | ICD-10-CM

## 2019-08-05 DIAGNOSIS — I10 Essential (primary) hypertension: Secondary | ICD-10-CM | POA: Diagnosis not present

## 2019-08-05 DIAGNOSIS — E441 Mild protein-calorie malnutrition: Secondary | ICD-10-CM

## 2019-08-05 DIAGNOSIS — N400 Enlarged prostate without lower urinary tract symptoms: Secondary | ICD-10-CM | POA: Diagnosis not present

## 2019-08-05 DIAGNOSIS — E46 Unspecified protein-calorie malnutrition: Secondary | ICD-10-CM | POA: Insufficient documentation

## 2019-08-05 DIAGNOSIS — Z8616 Personal history of COVID-19: Secondary | ICD-10-CM

## 2019-08-05 DIAGNOSIS — Z8619 Personal history of other infectious and parasitic diseases: Secondary | ICD-10-CM | POA: Diagnosis not present

## 2019-08-05 DIAGNOSIS — R0602 Shortness of breath: Secondary | ICD-10-CM | POA: Diagnosis not present

## 2019-08-05 DIAGNOSIS — E782 Mixed hyperlipidemia: Secondary | ICD-10-CM | POA: Diagnosis not present

## 2019-08-05 DIAGNOSIS — R7303 Prediabetes: Secondary | ICD-10-CM | POA: Diagnosis not present

## 2019-08-05 HISTORY — DX: Personal history of COVID-19: Z86.16

## 2019-08-05 MED ORDER — FINASTERIDE 5 MG PO TABS: 5.0000 mg | ORAL_TABLET | Freq: Every day | ORAL | 3 refills | Status: DC

## 2019-08-05 NOTE — Progress Notes (Signed)
Jim Tucker is a 78 y.o. male who presents to [place]: [place] today for hospital follow-up.  As noted in my last 2 notes on August 17 and 20 patient was hospitalized for COVID from August 6-13.  His hospital course was significant for increased inflammatory markers included fibrinogen AST ALT alkaline phosphatase CRP and d-dimer.  He was given steroids and remdesivir.  Fortunately he never required intubation.  He was discharged with a course of oral dexamethasone.  When I saw him remotely for follow-up visit on the 17th he was still having some chest pain and shortness of breath symptoms that had worsened once he finished his initial course of oral dexamethasone.  I represcribed the dexamethasone for 7-day course been checked him again on the 20th.  At that time he was feeling a bit better.  He noted that he was complaining of insomnia not relieved with Tylenol PM over-the-counter.  He also had urinary frequency.  He was asked to follow-up today.  He was given trazodone and asked to discontinue Tylenol PM which was thought to be causing his urinary frequency due to urinary retention.  In the interim: Jim Tucker is feeling better.  He has discontinued dexamethasone as scheduled and has not had return of chest pain or significant shortness of breath.  He does note continued fatigue and some shortness of breath with exertion but overall is improving.  He denies cough vomiting or diarrhea.  He notes that trazodone did not help his insomnia very much but the insomnia is improving off of dexamethasone.  He notes that he is lost some weight during his hospitalization and is working to start exercising and increasing his muscle mass again.  He also has a history of BPH and is currently taking finasteride.  He notes this typically controls the symptoms pretty well.  He does not have any urinary  symptoms currently.    ROS as above:  Exam:  BP 112/75   Pulse 88   Temp (!) 96 F (35.6 C)   Ht 5\' 5"  (1.651 m)   Wt 117 lb (53.1 kg)   BMI 19.47 kg/m   Wt Readings from Last 5 Encounters:  08/05/19 117 lb (53.1 kg)  07/15/19 120 lb (54.4 kg)  07/15/19 130 lb (59 kg)  08/21/18 137 lb (62.1 kg)  08/12/18 137 lb 11.2 oz (62.5 kg)    Gen: Well NAD thin appearing some decreased muscle mass in hands and cheeks HEENT: EOMI,  MMM Lungs: Normal work of breathing. CTABL Heart: RRR no MRG Abd: NABS, Soft. Nondistended, Nontender Exts: Brisk capillary refill, warm and well perfused.   Lab and Radiology Results Discharge summary labs and x-ray reports reviewed from recent hospitalization   Assessment and Plan: 78 y.o. male with  Improving but continued shortness of breath following COVID-19 infection.  Improving but not resolved.  Patient does continue to have some exertional difficulty.  Plan to continue to advance activity as tolerated.  If not improved in about a month patient will let me know and we will proceed with outpatient rehab.  We will labs today to follow-up anemia and transaminitis that was seen in the hospital.  Additionally will check PSA as patient is due for routine PSA monitoring. Refill finasteride. Influenza vaccine given today prior to discharge.  Follow-up in 6 months likely with Dr. Sheppard Coil.  I informed patient that I am transitioning to sports medicine only [place] in [place] starting  in November.  Happy to see patient for continued sports medicine needs.  Discussed need for new PCP.  Provided some recommendations.   PDMP not reviewed this encounter. Orders Placed This Encounter  Procedures  . Flu Vaccine QUAD 36+ mos IM  . CBC with Differential/Platelet  . COMPLETE METABOLIC PANEL WITH GFR  . PSA   Meds ordered this encounter  Medications  . DISCONTD: finasteride (PROSCAR) 5 MG tablet    Sig: Take 1 tablet (5 mg total) by  mouth daily.    Dispense:  90 tablet    Refill:  3    Dispense 6 month supply. [place]  . finasteride (PROSCAR) 5 MG tablet    Sig: Take 1 tablet (5 mg total) by mouth daily.    Dispense:  90 tablet    Refill:  3     Historical information moved to improve visibility of documentation.  Past Medical History:  Diagnosis Date  . Arthritis    shoulders, back  . BPH (benign prostatic hypertrophy)    takes Proscar daily  . Diverticulosis   . History of 2019 novel coronavirus disease (COVID-19) 08/05/2019   Required hospitalization discharged August 2020  . History of colon polyps   . Hyperlipidemia    takes Atorvastatin daily  . Internal hemorrhoid   . LBP (low back pain)    HNP  . Nocturia   . Numbness and tingling    in right leg   Past Surgical History:  Procedure Laterality Date  . BACK SURGERY    . COLONOSCOPY    . CYSTOSCOPY WITH BIOPSY N/A 07/19/2013   Procedure: TRANSURETHRAL RESECTION OF BLADDER NECK;  Surgeon: Bernestine Amass, MD;  Location: [facility];  Service: Urology;  Laterality: N/A;  . LUMBAR LAMINECTOMY Right 08/12/2014   Procedure: Right L2-3 Hemilaminectomy, Removal HNP;  Surgeon: Marybelle Killings, MD;  Location: [facility];  Service: Orthopedics;  Laterality: Right;  . LUMBAR SPINE SURGERY     x 2  . NASAL SINUS SURGERY  40 yrs ago  . PROSTATE BIOPSY  2007, 2010   benign.  Urology - Dr. Ellwood Sayers  . ROTATOR CUFF REPAIR Bilateral    total of 3  . TRANSURETHRAL RESECTION OF PROSTATE  10-23-2009   Social History   Tobacco Use  . Smoking status: Former Research scientist (life sciences)  . Smokeless tobacco: Never Used  . Tobacco comment: quit smoking in Mar 2015  Substance Use Topics  . Alcohol use: Yes    Comment: wine occasionally   family history includes Stomach cancer in his brother.  Medications: Current Outpatient Medications  Medication Sig Dispense Refill  . atorvastatin (LIPITOR) 80 MG tablet TAKE 1 2 (ONE HALF)  TABLET BY MOUTH ONCE DAILY    . finasteride (PROSCAR) 5 MG tablet Take 1 tablet (5 mg total) by mouth daily. 90 tablet 3  . hydrochlorothiazide (HYDRODIURIL) 25 MG tablet Take 25 mg by mouth daily.     No current facility-administered medications for this visit.    Allergies  Allergen Reactions  . Other Other (See Comments)     Raw Apples swells throat     Discussed warning signs or symptoms. Please see discharge instructions. Patient expresses understanding.

## 2019-08-05 NOTE — Patient Instructions (Signed)
Thank you for coming in today. Continue to advance exercise.  Recheck in 6 months with Dr Sheppard Coil or sooner if needed.  If your breathing or ability to exercise does not improve in 1 month let me know.  We may consider a lung rehab program.    I will be moving to full time Sports Medicine in [place] starting on November 1st.  You will still be able to see me for your Sports Medicine or Orthopedic needs in the [facility] Location. I will still be part of [place].   [address] [facility], [place] [address] [phone]  Telephone (phone line will be functional starting in November).  [phone] Fax [phone] Concussion Line  If you want to stay locally for your Sports Medicine issues Dr. Dianah Field here in [place] will be happy to see you.  Additionally Dr. Clearance Coots at [facility] will be happy to see you for sports medicine issues more locally.   For your primary care needs you are welcome to establish care with Dr. Emeterio Reeve.  We are working quickly to hire more physicians to cover the primary care needs however if you cannot get an appointment with Dr. Sheppard Coil in a timely manner  has locations and openings for primary care services nearby.   [facility] at [facility] [address] . [place] , [place]: [phone] . Behavioral Medicine: [phone] . Fax: [place] at [employer] [address] . [place], [place]: [phone] . Behavioral Medicine: [phone] . Fax: [phone] . Hours (M-F): 7am - Academic librarian At [facility]. [place], [place]: [phone] . Behavioral Medicine: [phone] . Fax: [phone] . Hours (M-F): 8am - Optician, dispensing at [employer] . [place], [place] Phone:  [phone] . Behavioral Medicine: [phone] . Fax: [phone]

## 2019-08-06 LAB — CBC WITH DIFFERENTIAL/PLATELET
Absolute Monocytes: 667 cells/uL (ref 200–950)
Basophils Absolute: 35 cells/uL (ref 0–200)
Basophils Relative: 0.3 %
Eosinophils Absolute: 0 cells/uL — ABNORMAL LOW (ref 15–500)
Eosinophils Relative: 0 %
HCT: 48.2 % (ref 38.5–50.0)
Hemoglobin: 16.1 g/dL (ref 13.2–17.1)
Lymphs Abs: 679 cells/uL — ABNORMAL LOW (ref 850–3900)
MCH: 30.3 pg (ref 27.0–33.0)
MCHC: 33.4 g/dL (ref 32.0–36.0)
MCV: 90.8 fL (ref 80.0–100.0)
MPV: 11.3 fL (ref 7.5–12.5)
Monocytes Relative: 5.7 %
Neutro Abs: 10319 cells/uL — ABNORMAL HIGH (ref 1500–7800)
Neutrophils Relative %: 88.2 %
Platelets: 119 10*3/uL — ABNORMAL LOW (ref 140–400)
RBC: 5.31 10*6/uL (ref 4.20–5.80)
RDW: 13.9 % (ref 11.0–15.0)
Total Lymphocyte: 5.8 %
WBC: 11.7 10*3/uL — ABNORMAL HIGH (ref 3.8–10.8)

## 2019-08-06 LAB — COMPLETE METABOLIC PANEL WITH GFR
AG Ratio: 1.1 (calc) (ref 1.0–2.5)
ALT: 56 U/L — ABNORMAL HIGH (ref 9–46)
AST: 24 U/L (ref 10–35)
Albumin: 3 g/dL — ABNORMAL LOW (ref 3.6–5.1)
Alkaline phosphatase (APISO): 134 U/L (ref 35–144)
BUN/Creatinine Ratio: 51 (calc) — ABNORMAL HIGH (ref 6–22)
BUN: 33 mg/dL — ABNORMAL HIGH (ref 7–25)
CO2: 26 mmol/L (ref 20–32)
Calcium: 8.7 mg/dL (ref 8.6–10.3)
Chloride: 98 mmol/L (ref 98–110)
Creat: 0.65 mg/dL — ABNORMAL LOW (ref 0.70–1.18)
GFR, Est African American: 108 mL/min/{1.73_m2} (ref >=60)
GFR, Est Non African American: 93 mL/min/{1.73_m2} (ref >=60)
Globulin: 2.7 g/dL (calc) (ref 1.9–3.7)
Glucose, Bld: 207 mg/dL — ABNORMAL HIGH (ref 65–99)
Potassium: 4.4 mmol/L (ref 3.5–5.3)
Sodium: 134 mmol/L — ABNORMAL LOW (ref 135–146)
Total Bilirubin: 0.7 mg/dL (ref 0.2–1.2)
Total Protein: 5.7 g/dL — ABNORMAL LOW (ref 6.1–8.1)

## 2019-08-06 LAB — PSA: PSA: 0.6 ng/mL (ref <=4.0)

## 2019-08-07 ENCOUNTER — Emergency Department (HOSPITAL_COMMUNITY): Payer: Medicare Other

## 2019-08-07 ENCOUNTER — Emergency Department (HOSPITAL_COMMUNITY)
Admission: EM | Admit: 2019-08-07 | Discharge: 2019-08-07 | Disposition: A | Discharge status: Home / Self Care | Payer: Medicare Other | Attending: Emergency Medicine | Admitting: Emergency Medicine

## 2019-08-07 ENCOUNTER — Encounter (HOSPITAL_COMMUNITY): Payer: Self-pay | Admitting: Emergency Medicine

## 2019-08-07 ENCOUNTER — Other Ambulatory Visit: Payer: Self-pay

## 2019-08-07 DIAGNOSIS — R0689 Other abnormalities of breathing: Secondary | ICD-10-CM | POA: Diagnosis not present

## 2019-08-07 DIAGNOSIS — I10 Essential (primary) hypertension: Secondary | ICD-10-CM | POA: Diagnosis not present

## 2019-08-07 DIAGNOSIS — J069 Acute upper respiratory infection, unspecified: Secondary | ICD-10-CM

## 2019-08-07 DIAGNOSIS — N390 Urinary tract infection, site not specified: Secondary | ICD-10-CM | POA: Insufficient documentation

## 2019-08-07 DIAGNOSIS — Z87891 Personal history of nicotine dependence: Secondary | ICD-10-CM | POA: Insufficient documentation

## 2019-08-07 DIAGNOSIS — R0902 Hypoxemia: Secondary | ICD-10-CM | POA: Diagnosis not present

## 2019-08-07 DIAGNOSIS — R52 Pain, unspecified: Secondary | ICD-10-CM | POA: Diagnosis not present

## 2019-08-07 DIAGNOSIS — R000 Tachycardia, unspecified: Secondary | ICD-10-CM | POA: Diagnosis not present

## 2019-08-07 DIAGNOSIS — Z79899 Other long term (current) drug therapy: Secondary | ICD-10-CM | POA: Diagnosis not present

## 2019-08-07 DIAGNOSIS — R509 Fever, unspecified: Secondary | ICD-10-CM | POA: Diagnosis not present

## 2019-08-07 DIAGNOSIS — U071 COVID-19: Secondary | ICD-10-CM | POA: Insufficient documentation

## 2019-08-07 DIAGNOSIS — I959 Hypotension, unspecified: Secondary | ICD-10-CM | POA: Diagnosis not present

## 2019-08-07 LAB — URINALYSIS, ROUTINE W REFLEX MICROSCOPIC
Bilirubin Urine: NEGATIVE
Glucose, UA: NEGATIVE mg/dL
Hgb urine dipstick: NEGATIVE
Ketones, ur: NEGATIVE mg/dL
Nitrite: POSITIVE — AB
Protein, ur: 30 mg/dL — AB
Specific Gravity, Urine: 1.015 (ref 1.005–1.030)
WBC, UA: >50 WBC/hpf — ABNORMAL HIGH (ref 0–5)
pH: 6 (ref 5.0–8.0)

## 2019-08-07 LAB — SARS CORONAVIRUS 2 BY RT PCR (HOSPITAL ORDER, PERFORMED IN ~~LOC~~ HOSPITAL LAB): SARS Coronavirus 2: POSITIVE — AB

## 2019-08-07 MED ORDER — PREDNISONE 20 MG PO TABS
40.0000 mg | ORAL_TABLET | Freq: Once | ORAL | Status: AC
  Administered 2019-08-07: 40 mg via ORAL
  Filled 2019-08-07: qty 2

## 2019-08-07 MED ORDER — CEPHALEXIN 500 MG PO CAPS: 500.0000 mg | ORAL_CAPSULE | Freq: Four times a day (QID) | ORAL | 0 refills | Status: DC

## 2019-08-07 MED ORDER — PREDNISONE 20 MG PO TABS: 40.0000 mg | ORAL_TABLET | Freq: Every day | ORAL | 0 refills | Status: DC

## 2019-08-07 MED ORDER — ALBUTEROL SULFATE HFA 108 (90 BASE) MCG/ACT IN AERS
2.0000 | INHALATION_SPRAY | Freq: Once | RESPIRATORY_TRACT | Status: AC
  Administered 2019-08-07: 23:00:00 2 via RESPIRATORY_TRACT
  Filled 2019-08-07: qty 6.7

## 2019-08-07 MED ORDER — ACETAMINOPHEN 325 MG PO TABS
650.0000 mg | ORAL_TABLET | Freq: Once | ORAL | Status: AC
  Administered 2019-08-07: 650 mg via ORAL
  Filled 2019-08-07: qty 2

## 2019-08-07 MED ORDER — CEPHALEXIN 500 MG PO CAPS
500.0000 mg | ORAL_CAPSULE | Freq: Once | ORAL | Status: AC
  Administered 2019-08-07: 500 mg via ORAL
  Filled 2019-08-07: qty 1

## 2019-08-07 NOTE — Discharge Instructions (Signed)
Your tests suggest that you have a urinary tract infection.  Please use Keflex with breakfast, lunch, dinner, and at bedtime until all taken.  Please increase your fluids.  Please monitor your temperature closely.  Use Tylenol every 4 hours for temperature elevations.  Your x-ray shows some improvement, but there remains some abnormality of your x-ray.  Please use albuterol 2 puffs every 4 hours, use prednisone daily until all taken.  Please continue to quarantine yourself from others over the next 10 days.  Please see Dr. Georgina Snell for follow-up and recheck.  Return to the emergency department if any high fever, shortness of breath, coughing up blood, difficulty with breathing, changes in your condition, problems or concerns.

## 2019-08-07 NOTE — ED Notes (Signed)
Pt reports he did have some generalized body aches, legs aching and chills, prior to arrival. Pt says these symptoms have resolved after getting IV fluids.

## 2019-08-07 NOTE — ED Notes (Signed)
Spoke with pt spouse, Tretha Sciara, and updated on pt status and plan of care. Spouse is waiting outside for pt to be discharged.

## 2019-08-07 NOTE — ED Provider Notes (Signed)
Received patient at shift change.  Patient is a 78 year old male who presents to the emergency department with complaint of fever, chills, body aches, and generally not feeling well.  It is of note that the patient was admitted to the hospital earlier during the month with COVID 19.  He had some oxygen demands and required hospitalization.  He says over the last few days he been feeling fine, until today he began to have these fever and chills and body aches symptoms.  Work-up is in progress.  Urine analysis, COVID-19 test, and chest x-ray are pending.  COVID-19 test returned positive.  I discussed the case with Dr. Scharlene Gloss, infectious disease.  He reports that the tests can remain positive for 40 or 50days.  If there are no changes in the patient's oxygen demand, and no changes in vital signs, the patient can be discharged home and continue quarantine and close follow-up.  Chest x-ray shows improvement from the prior x-ray.  It does show persistent scattered groundglass air space opacities bilaterally.  Urine analysis shows a hazy yellow specimen with a specific gravity of 1.015.  The nitrates are positive, the leukocyte esterase is positive, there are greater than 50 white blood cells, and many bacteria.  A culture will be sent to the lab.  The patient will be started on Keflex for urinary tract infection. Dx: Upper respiratory tract infection due to COVID-19 virus  Urinary tract infection without hematuria, site unspecified       Lily Kocher, PA-C 08/08/19 1207    Nat Christen, MD 08/08/19 [phone]

## 2019-08-07 NOTE — ED Triage Notes (Addendum)
Pt states his wife called EMS because he had a fever and chills. Pt had positive COVID test 07/15/2019. Pt denies SOB. Pt has received 102ml NS.

## 2019-08-07 NOTE — ED Notes (Signed)
Date and time results received: 08/07/19 10:03 PM (use smartphrase ".now" to insert current time)  Test: Covid 19 Critical Value: positive  Name of Provider Notified: Petrucelli, PA and Dr Lindajo Royal  Orders Received? Or Actions Taken?: no new orders

## 2019-08-07 NOTE — ED Provider Notes (Signed)
[facility] EMERGENCY DEPARTMENT Provider Note   CSN: [id number] Arrival date & time: 08/07/19  2030     History   Chief Complaint Chief Complaint  Patient presents with  . Fever    HPI Jim Tucker is a 78 y.o. male with a hx of HTN, hyperlipidemia, chronic back pain, & recent hospital admission for COVID 19 who presents to the ED for fever/chills/myalgias that occurred shortly PTA. Patient states he has been feeling fairly well for the past few days without significant symptoms/complaints. This evening he was in his home when he started to experience chills with mild rigors & generalized body aches. He and his wife checked his temperature & he had a fever which concerned them and prompted EMS to be called (he is unsure what his temperature was). Patient received 1L of fluids en route. He is currently feeling well without complaints & states his chills & myalgias are resolved. No alleviating/aggravating factors. Denies URI sxs, cough, dyspnea, chest pain, abdominal pain, diarrhea, dysuria, frequency, or rash.   Per chart review:  Patient had recent hospital admission 07/15/19-07/22/19 for acute respiratory disease due to COVID-19 virus w/ O2 requirement. Inflammatory markers were grossly elevated, initial CXR clear- CT chest 24 hours after admission revealed widespread ground glass opacities, treated w/ steroids & a total of 5 day course of remdesivir (complicated by elevated LFTs intermittently). He was weaned off O2. completed total of 10 day course of steroids (solumedrol inpatient, discharged on decadron). F/u with PCP 08/17- had continued chest discomfort/dyspnea- given additional 7 day course of decadron & had follow up 08/27 and was doing well, mentioned some insomnia & urinary frequency- denies this to me today. CBC & CMP drawn- leukocytosis downtrending. LFTs without substantial elevation. Renal function reserved.      HPI  Past Medical History:  Diagnosis Date  . Arthritis     shoulders, back  . BPH (benign prostatic hypertrophy)    takes Proscar daily  . Diverticulosis   . History of 2019 novel coronavirus disease (COVID-19) 08/05/2019   Required hospitalization discharged August 2020  . History of colon polyps   . Hyperlipidemia    takes Atorvastatin daily  . Internal hemorrhoid   . LBP (low back pain)    HNP  . Nocturia   . Numbness and tingling    in right leg    Patient Active Problem List   Diagnosis Date Noted  . History of 2019 novel coronavirus disease (COVID-19) 08/05/2019  . Protein-calorie malnutrition ([place]) 08/05/2019  . Respiratory tract infection due to COVID-19 virus 07/15/2019  . Internal hemorrhoid 10/15/2018  . Onychomycosis 08/12/2018  . Prediabetes 05/12/2018  . HTN (hypertension) 02/25/2017  . HNP (herniated nucleus pulposus), lumbar 08/08/2014  . COLONIC POLYPS 03/28/2008  . DIVERTICULOSIS OF COLON 03/28/2008  . BPH (benign prostatic hyperplasia) 09/10/2007  . PTERYGIUM NOS 03/02/2007  . DYSPEPSIA 12/03/2006  . SEBORRHEIC KERATOSIS 11/18/2006  . Hyperlipidemia 11/13/2006  . DYSRHYTHMIA, CARDIAC NOS 11/13/2006    Past Surgical History:  Procedure Laterality Date  . BACK SURGERY    . COLONOSCOPY    . CYSTOSCOPY WITH BIOPSY N/A 07/19/2013   Procedure: TRANSURETHRAL RESECTION OF BLADDER NECK;  Surgeon: Bernestine Amass, MD;  Location: [facility];  Service: Urology;  Laterality: N/A;  . LUMBAR LAMINECTOMY Right 08/12/2014   Procedure: Right L2-3 Hemilaminectomy, Removal HNP;  Surgeon: Marybelle Killings, MD;  Location: [place];  Service: Orthopedics;  Laterality: Right;  . LUMBAR SPINE SURGERY  x 2  . NASAL SINUS SURGERY  40 yrs ago  . PROSTATE BIOPSY  2007, 2010   benign.  Urology - Dr. Ellwood Sayers  . ROTATOR CUFF REPAIR Bilateral    total of 3  . TRANSURETHRAL RESECTION OF PROSTATE  10-23-2009        Home Medications    Prior to Admission medications   Medication Sig Start Date End Date Taking?  Authorizing Provider  atorvastatin (LIPITOR) 80 MG tablet TAKE 1 2 (ONE HALF) TABLET BY MOUTH ONCE DAILY 07/23/19   [provider]  finasteride (PROSCAR) 5 MG tablet Take 1 tablet (5 mg total) by mouth daily. 08/05/19   Gregor Hams, MD  hydrochlorothiazide (HYDRODIURIL) 25 MG tablet Take 25 mg by mouth daily. 07/23/19   [provider]    Family History Family History  Problem Relation Age of Onset  . Stomach cancer Brother   . Colon polyps Neg Hx   . Esophageal cancer Neg Hx   . Rectal cancer Neg Hx   . Pancreatic cancer Neg Hx     Social History Social History   Tobacco Use  . Smoking status: Former Research scientist (life sciences)  . Smokeless tobacco: Never Used  . Tobacco comment: quit smoking in Mar 2015  Substance Use Topics  . Alcohol use: Yes    Comment: wine occasionally  . Drug use: No     Allergies   Other   Review of Systems Review of Systems  Constitutional: Positive for chills and fever.  HENT: Negative for congestion, ear pain and sore throat.   Respiratory: Negative for cough and shortness of breath.   Cardiovascular: Negative for chest pain, palpitations and leg swelling.  Gastrointestinal: Negative for abdominal pain, anal bleeding, blood in stool, constipation, diarrhea, nausea and vomiting.  Genitourinary: Negative for dysuria, frequency, hematuria, scrotal swelling and testicular pain.  Musculoskeletal: Positive for myalgias.  Neurological: Negative for syncope.  All other systems reviewed and are negative.  Physical Exam Updated Vital Signs BP 97/60 (BP Location: Right Arm)   Pulse 97   Temp 99.1 F (37.3 C) (Oral)   Resp (!) 24   SpO2 95%   Physical Exam Vitals signs and nursing note reviewed.  Constitutional:      General: He is not in acute distress.    Appearance: He is well-developed. He is not toxic-appearing.  HENT:     Head: Normocephalic and atraumatic.     Right Ear: Tympanic membrane is not perforated, erythematous, retracted or  bulging.     Left Ear: Tympanic membrane is not perforated, erythematous, retracted or bulging.     Nose: Nose normal.     Mouth/Throat:     Pharynx: Uvula midline. No oropharyngeal exudate or posterior oropharyngeal erythema.  Eyes:     General:        Right eye: No discharge.        Left eye: No discharge.     Conjunctiva/sclera: Conjunctivae normal.  Neck:     Musculoskeletal: Normal range of motion and neck supple. No neck rigidity.  Cardiovascular:     Rate and Rhythm: Normal rate and regular rhythm.  Pulmonary:     Effort: Pulmonary effort is normal. No tachypnea, accessory muscle usage or respiratory distress.     Breath sounds: Normal breath sounds. No wheezing, rhonchi or rales.     Comments: SpO2 94-96% on RA @ rest. Maintained > 92% w/ ambulation.  Abdominal:     General: There is no distension.  Palpations: Abdomen is soft.     Tenderness: There is no abdominal tenderness. There is no guarding or rebound.  Skin:    General: Skin is warm and dry.     Findings: No rash.  Neurological:     Mental Status: He is alert.     Comments: Clear speech.   Psychiatric:        Behavior: Behavior normal.    ED Treatments / Results  Labs (all labs ordered are listed, but only abnormal results are displayed) Labs Reviewed  SARS CORONAVIRUS 2 (HOSPITAL ORDER, [facility])    EKG None  Radiology No results found.  Procedures Procedures (including critical care time)  Medications Ordered in ED Medications - No data to display   Initial Impression / Assessment and Plan / ED Course  I have reviewed the triage vital signs and the nursing notes.  Pertinent labs & imaging results that were available during my care of the patient were reviewed by me and considered in my medical decision making (see chart for details).   Patient presents to the ED with complaints of fever/chills/bodyaches that began just PTA & are now relatively resolved. He is  otherwise asymptomatic. His recent hospitalization and office visits w/ PCP have been reviewed as discussed above. In the ED he is nontoxic appearing, resting comfortably, & vitals are without significant abnormalities. He has borderline oral temp of 99.1 without anti-pyretics (tylenol ordered here as his most recent LFTs were not grossly elevated), he is not tachycardic, pressures are a bit soft; repeat while I was in the exam room 104/54. He is not hypoxic and does not appear tachypneic on my exam. I personally ambulated the patient w/ SpO2 > 92%. Given his overall well appearance w/ no significant sxs presently will check CXR & UA, covid swab sent by nursing staff.   Labs/imaging pending  22:00: Care signed out to Lily Kocher PA-C at change of shift pending labs & CXR, unless significant concerning findings or significant change in clinical status/presentation anticipate discharge home.   Findings and plan of care discussed with supervising physician Dr. Lacinda Axon who has evaluated patient & is in agreement.    Final Clinical Impressions(s) / ED Diagnoses   Final diagnoses:  None    ED Discharge Orders    None       Leafy Kindle 08/07/19 2201    Nat Christen, MD 08/08/19 [phone]

## 2019-08-07 NOTE — ED Notes (Signed)
1000 ml of NS started by EMS has finished infusing.

## 2019-08-10 LAB — URINE CULTURE: Culture: 90000 — AB

## 2019-08-13 ENCOUNTER — Telehealth: Payer: Self-pay

## 2019-08-13 NOTE — Telephone Encounter (Signed)
Post ED Visit - Positive Culture Follow-up: Unsuccessful Patient Follow-up  Culture assessed and recommendations reviewed by:  []  Elenor Quinones, Pharm.D. []  Heide Guile, Pharm.D., BCPS AQ-ID []  Parks Neptune, Pharm.D., BCPS []  Alycia Rossetti, Pharm.D., BCPS []  [place], [place].D., BCPS, AAHIVP []  Legrand Como, Pharm.D., BCPS, AAHIVP []  Wynell Balloon, PharmD []  Vincenza Hews, PharmD, BCPS For Symptom check   May D/d Keflex if now symptoms. Pt called on 9/2 x 2, 9/3,  Positive urine culture  []  Patient discharged without antimicrobial prescription and treatment is now indicated [x]  Organism is resistant to prescribed ED discharge antimicrobial 08/07/2019 []  Patient with positive blood cultures   Unable to contact patient after 3 attempts, letter will be sent to address on file  Genia Del 08/13/2019, 9:44 AM

## 2019-08-19 ENCOUNTER — Encounter (HOSPITAL_COMMUNITY): Payer: Self-pay | Admitting: Emergency Medicine

## 2019-08-19 ENCOUNTER — Ambulatory Visit (INDEPENDENT_AMBULATORY_CARE_PROVIDER_SITE_OTHER): Payer: Medicare Other | Admitting: Family Medicine

## 2019-08-19 ENCOUNTER — Emergency Department (HOSPITAL_COMMUNITY): Payer: Medicare Other

## 2019-08-19 ENCOUNTER — Observation Stay (HOSPITAL_COMMUNITY)
Admission: EM | Admit: 2019-08-19 | Discharge: 2019-08-20 | Disposition: A | Discharge status: Home / Self Care | Payer: Medicare Other | Attending: Internal Medicine | Admitting: Internal Medicine

## 2019-08-19 ENCOUNTER — Telehealth: Payer: Self-pay | Admitting: Family Medicine

## 2019-08-19 ENCOUNTER — Other Ambulatory Visit: Payer: Self-pay

## 2019-08-19 ENCOUNTER — Ambulatory Visit: Payer: Medicare Other | Admitting: Family Medicine

## 2019-08-19 VITALS — BP 133/74 | HR 117 | Temp 98.2°F | Wt 122.0 lb

## 2019-08-19 DIAGNOSIS — R000 Tachycardia, unspecified: Secondary | ICD-10-CM | POA: Insufficient documentation

## 2019-08-19 DIAGNOSIS — E46 Unspecified protein-calorie malnutrition: Secondary | ICD-10-CM | POA: Diagnosis not present

## 2019-08-19 DIAGNOSIS — Z1612 Extended spectrum beta lactamase (ESBL) resistance: Secondary | ICD-10-CM | POA: Diagnosis not present

## 2019-08-19 DIAGNOSIS — K5730 Diverticulosis of large intestine without perforation or abscess without bleeding: Secondary | ICD-10-CM | POA: Diagnosis not present

## 2019-08-19 DIAGNOSIS — X58XXXA Exposure to other specified factors, initial encounter: Secondary | ICD-10-CM | POA: Diagnosis not present

## 2019-08-19 DIAGNOSIS — Z8616 Personal history of COVID-19: Secondary | ICD-10-CM | POA: Diagnosis present

## 2019-08-19 DIAGNOSIS — Z79899 Other long term (current) drug therapy: Secondary | ICD-10-CM | POA: Insufficient documentation

## 2019-08-19 DIAGNOSIS — E441 Mild protein-calorie malnutrition: Secondary | ICD-10-CM

## 2019-08-19 DIAGNOSIS — U071 COVID-19: Secondary | ICD-10-CM | POA: Diagnosis not present

## 2019-08-19 DIAGNOSIS — Z8619 Personal history of other infectious and parasitic diseases: Secondary | ICD-10-CM | POA: Diagnosis not present

## 2019-08-19 DIAGNOSIS — Z87891 Personal history of nicotine dependence: Secondary | ICD-10-CM | POA: Insufficient documentation

## 2019-08-19 DIAGNOSIS — R7303 Prediabetes: Secondary | ICD-10-CM | POA: Insufficient documentation

## 2019-08-19 DIAGNOSIS — I119 Hypertensive heart disease without heart failure: Secondary | ICD-10-CM | POA: Diagnosis not present

## 2019-08-19 DIAGNOSIS — N2889 Other specified disorders of kidney and ureter: Secondary | ICD-10-CM | POA: Diagnosis not present

## 2019-08-19 DIAGNOSIS — N390 Urinary tract infection, site not specified: Secondary | ICD-10-CM | POA: Diagnosis not present

## 2019-08-19 DIAGNOSIS — S37011A Minor contusion of right kidney, initial encounter: Secondary | ICD-10-CM | POA: Diagnosis not present

## 2019-08-19 DIAGNOSIS — B9620 Unspecified Escherichia coli [E. coli] as the cause of diseases classified elsewhere: Secondary | ICD-10-CM | POA: Diagnosis not present

## 2019-08-19 DIAGNOSIS — R9431 Abnormal electrocardiogram [ECG] [EKG]: Secondary | ICD-10-CM | POA: Diagnosis not present

## 2019-08-19 DIAGNOSIS — D509 Iron deficiency anemia, unspecified: Secondary | ICD-10-CM | POA: Diagnosis not present

## 2019-08-19 DIAGNOSIS — I10 Essential (primary) hypertension: Secondary | ICD-10-CM | POA: Diagnosis not present

## 2019-08-19 DIAGNOSIS — Z7952 Long term (current) use of systemic steroids: Secondary | ICD-10-CM | POA: Insufficient documentation

## 2019-08-19 DIAGNOSIS — D649 Anemia, unspecified: Secondary | ICD-10-CM | POA: Diagnosis not present

## 2019-08-19 DIAGNOSIS — E785 Hyperlipidemia, unspecified: Secondary | ICD-10-CM | POA: Insufficient documentation

## 2019-08-19 DIAGNOSIS — S37019A Minor contusion of unspecified kidney, initial encounter: Secondary | ICD-10-CM | POA: Diagnosis present

## 2019-08-19 DIAGNOSIS — N400 Enlarged prostate without lower urinary tract symptoms: Secondary | ICD-10-CM | POA: Insufficient documentation

## 2019-08-19 DIAGNOSIS — A499 Bacterial infection, unspecified: Secondary | ICD-10-CM | POA: Diagnosis present

## 2019-08-19 DIAGNOSIS — R918 Other nonspecific abnormal finding of lung field: Secondary | ICD-10-CM | POA: Diagnosis not present

## 2019-08-19 DIAGNOSIS — E782 Mixed hyperlipidemia: Secondary | ICD-10-CM | POA: Diagnosis not present

## 2019-08-19 LAB — URINALYSIS, ROUTINE W REFLEX MICROSCOPIC
Bilirubin Urine: NEGATIVE
Glucose, UA: >=500 mg/dL — AB
Ketones, ur: NEGATIVE mg/dL
Nitrite: POSITIVE — AB
Protein, ur: 30 mg/dL — AB
Specific Gravity, Urine: 1.023 (ref 1.005–1.030)
WBC, UA: >50 WBC/hpf — ABNORMAL HIGH (ref 0–5)
pH: 5 (ref 5.0–8.0)

## 2019-08-19 LAB — FIBRINOGEN: Fibrinogen: 710 mg/dL — ABNORMAL HIGH (ref 210–475)

## 2019-08-19 LAB — COMPREHENSIVE METABOLIC PANEL
ALT: 33 U/L (ref 0–44)
AST: 17 U/L (ref 15–41)
Albumin: 2.5 g/dL — ABNORMAL LOW (ref 3.5–5.0)
Alkaline Phosphatase: 96 U/L (ref 38–126)
Anion gap: 9 (ref 5–15)
BUN: 28 mg/dL — ABNORMAL HIGH (ref 8–23)
CO2: 22 mmol/L (ref 22–32)
Calcium: 8.3 mg/dL — ABNORMAL LOW (ref 8.9–10.3)
Chloride: 104 mmol/L (ref 98–111)
Creatinine, Ser: 0.63 mg/dL (ref 0.61–1.24)
GFR calc Af Amer: >60 mL/min (ref >60)
GFR calc non Af Amer: >60 mL/min (ref >60)
Glucose, Bld: 193 mg/dL — ABNORMAL HIGH (ref 70–99)
Potassium: 3.5 mmol/L (ref 3.5–5.1)
Sodium: 135 mmol/L (ref 135–145)
Total Bilirubin: 0.6 mg/dL (ref 0.3–1.2)
Total Protein: 5.6 g/dL — ABNORMAL LOW (ref 6.5–8.1)

## 2019-08-19 LAB — PROTIME-INR
INR: 1.1 (ref 0.8–1.2)
Prothrombin Time: 14.1 seconds (ref 11.4–15.2)

## 2019-08-19 LAB — CBC WITH DIFFERENTIAL/PLATELET
Abs Immature Granulocytes: 0.17 10*3/uL — ABNORMAL HIGH (ref 0.00–0.07)
Basophils Absolute: 0 10*3/uL (ref 0.0–0.1)
Basophils Relative: 0 %
Eosinophils Absolute: 0 10*3/uL (ref 0.0–0.5)
Eosinophils Relative: 0 %
HCT: 31.4 % — ABNORMAL LOW (ref 39.0–52.0)
Hemoglobin: 10.3 g/dL — ABNORMAL LOW (ref 13.0–17.0)
Immature Granulocytes: 2 %
Lymphocytes Relative: 11 %
Lymphs Abs: 1 10*3/uL (ref 0.7–4.0)
MCH: 30.6 pg (ref 26.0–34.0)
MCHC: 32.8 g/dL (ref 30.0–36.0)
MCV: 93.2 fL (ref 80.0–100.0)
Monocytes Absolute: 0.7 10*3/uL (ref 0.1–1.0)
Monocytes Relative: 8 %
Neutro Abs: 7.5 10*3/uL (ref 1.7–7.7)
Neutrophils Relative %: 79 %
Platelets: 160 10*3/uL (ref 150–400)
RBC: 3.37 MIL/uL — ABNORMAL LOW (ref 4.22–5.81)
RDW: 15.2 % (ref 11.5–15.5)
WBC: 9.4 10*3/uL (ref 4.0–10.5)
nRBC: 0 % (ref 0.0–0.2)

## 2019-08-19 LAB — PROCALCITONIN: Procalcitonin: 0.35 ng/mL

## 2019-08-19 LAB — POCT UA - GLUCOSE/PROTEIN
Glucose, UA: NEGATIVE
Nitrite: POSITIVE
Protein, UA: POSITIVE — AB
Specific Gravity: 1.02
Urobilinogen, UA: 1 E.U./dL
pH: 6

## 2019-08-19 LAB — C-REACTIVE PROTEIN: CRP: 22.9 mg/dL — ABNORMAL HIGH (ref <1.0)

## 2019-08-19 LAB — LACTATE DEHYDROGENASE: LDH: 144 U/L (ref 98–192)

## 2019-08-19 LAB — TRIGLYCERIDES: Triglycerides: 115 mg/dL (ref <150)

## 2019-08-19 LAB — LACTIC ACID, PLASMA: Lactic Acid, Venous: 1.5 mmol/L (ref 0.5–1.9)

## 2019-08-19 LAB — FERRITIN: Ferritin: 570 ng/mL — ABNORMAL HIGH (ref 24–336)

## 2019-08-19 LAB — D-DIMER, QUANTITATIVE: D-Dimer, Quant: 1.61 ug/mL-FEU — ABNORMAL HIGH (ref 0.00–0.50)

## 2019-08-19 MED ORDER — PIPERACILLIN-TAZOBACTAM 3.375 G IVPB 30 MIN
3.3750 g | Freq: Once | INTRAVENOUS | Status: AC
  Administered 2019-08-19: 3.375 g via INTRAVENOUS
  Filled 2019-08-19: qty 50

## 2019-08-19 MED ORDER — NITROFURANTOIN MONOHYD MACRO 100 MG PO CAPS: 100.0000 mg | ORAL_CAPSULE | Freq: Two times a day (BID) | ORAL | 0 refills | Status: DC

## 2019-08-19 MED ORDER — HYDROMORPHONE HCL 1 MG/ML IJ SOLN
0.5000 mg | Freq: Once | INTRAMUSCULAR | Status: AC
  Administered 2019-08-19: 22:00:00 0.5 mg via INTRAVENOUS
  Filled 2019-08-19: qty 1

## 2019-08-19 NOTE — ED Triage Notes (Signed)
Patient doesn't know why he is here-was told to come to ED for treatment but doesn't know why-notes state he has ESBL-needs PICC line and IV antibiotics-also tested positive for covid on 8/29-patient doesn't know if he is or not

## 2019-08-19 NOTE — Telephone Encounter (Signed)
I discussed your situation with the on-call infectious disease doctor.  Given the circumstances he recommend that you return to the emergency department for further evaluation likely including IV antibiotics and admission for starting PICC line for outpatient IV antibiotics.  Please proceed to emergency department.

## 2019-08-19 NOTE — H&P (Signed)
Jim Tucker DOB: 12/07/1941 DOA: 08/19/2019     PCP: Gregor Hams, MD   Outpatient Specialists:   NONE   Patient arrived to ER on 08/19/19 at 7  Patient coming from: home Lives  alone    Chief Complaint:   Chief Complaint  Patient presents with   needs IV antibiotics   covid pos    HPI: Jim Tucker is a 78 y.o. male with medical history significant of COVID +, BPH, diverticulosis, hyperlipidemia,    Presented with intermittent right flank pain since yesterday with urinary urgency and frequency for 2 weeks.  No dysuria hematuria.  No recent fevers no chest pain or shortness of breath  Patient initially was tested COVID positive on 6th of  August at that time he required hospitalization and had oxygen requirement he initially did better but then developed recurrence of his symptoms with chills and fevers and was seen again in emergency department on 29 August he tested again positive for COVID at the time He has been consulted and told ER provider that test could be positive up to 40 to 50 days plan was to discharge patient back home as there was no evidence of new hypoxia Chest x-ray at that time showed persistent scattered groundglass opacities Was worrisome for UTI he was started on Keflex On urine culture showed ESBL which was resistant to Keflex Sensitive to gentamicin Zosyn and nitrofurantoin Emergency department attempted to contact patient 3 times but was unsuccessful patient dines any injury  Patient was seen by primary care provider with flank pain back he have not had any fever since but does not feel very well. Found to be tachycardic up to 117  Susceptibility         Escherichia coli    MIC    AMPICILLIN >=32 RESIST... Resistant    AMPICILLIN/SULBACTAM >=32 RESIST... Resistant    CEFAZOLIN >=64 RESIST... Resistant    CEFTRIAXONE >=64 RESIST... Resistant    CIPROFLOXACIN >=4 RESISTANT  Resistant    Extended ESBL  POSITIVE  Resistant    GENTAMICIN <=1 SENSITIVE  Sensitive    IMIPENEM <=0.25 SENS... Sensitive    NITROFURANTOIN <=16 SENSIT... Sensitive    PIP/TAZO <=4 SENSITIVE  Sensitive    TRIMETH/SULFA >=320 RESIS... Resistant         Susceptibility Comments  Escherichia coli      Patient was prescribed nitrofurantoin as this is the only thing that.  He could take orally for his UTI Primary care provider discussed case with Dr. Megan Salon who was concerned for development of pyelonephritis recommended patient to return back to emergency department admission for IV antibiotics and likely initiation of a PICC line for ertapenem as an outpatient   Infectious risk factors:  Reports fever,   In  ER RAPID COVID TEST  in house testing  Pending  Lab Results  Component Value Date   [place] (A) 08/07/2019   [place] (A) 07/15/2019    Regarding pertinent Chronic problems:    Hyperlipidemia - on statins, lipitor    BPH -  Proscar  While in ER:  The following Work up has been ordered so far:  Orders Placed This Encounter  Procedures   SARS Coronavirus 2 [facility] order, Performed in [facility] hospital lab) Nasopharyngeal Nasopharyngeal Swab   Blood Culture (routine x 2)   DG Chest Port 1 View   CT Renal Stone Study   Lactic acid, plasma   CBC WITH DIFFERENTIAL   Comprehensive metabolic  panel   D-dimer, quantitative   Procalcitonin   Lactate dehydrogenase   Ferritin   Triglycerides   Fibrinogen   C-reactive protein   Urinalysis, Routine w reflex microscopic   Diet NPO time specified   Cardiac monitoring   Insert peripheral IV x 2   Initiate Carrier Fluid Protocol   Place surgical mask on patient   Patient to wear surgical mask during transportation   Assess patient for ability to self-prone. If able (can move self in bed, ambulate) and stable (SpO2 and oxygen requirement):   RN/NT - Document specific oxygen  requirements in [facility]   Notify EDP if new oxygen requirements escalates > 4L per minute [place]   RN to draw the following extra tubes:   Consult to urology  ALL PATIENTS BEING ADMITTED/HAVING PROCEDURES NEED COVID-19 SCREENING   Consult to hospitalist  ALL PATIENTS BEING ADMITTED/HAVING PROCEDURES NEED COVID-19 SCREENING   Airborne and Contact precautions   Pulse oximetry, continuous   ED EKG 12-Lead   EKG 12-Lead     Following Medications were ordered in ER: Medications  piperacillin-tazobactam (ZOSYN) IVPB 3.375 g (3.375 g Intravenous New Bag/Given 08/19/19 2217)  HYDROmorphone (DILAUDID) injection 0.5 mg (0.5 mg Intravenous Given 08/19/19 2217)        Consult Orders  (From admission, onward)         Start     Ordered   08/19/19 2250  Consult to hospitalist  ALL PATIENTS BEING ADMITTED/HAVING PROCEDURES NEED COVID-19 SCREENING  Once    Comments: ALL PATIENTS BEING ADMITTED/HAVING PROCEDURES NEED COVID-19 SCREENING  Provider:  (Not yet assigned)  Question Answer Comment  Place call to: [facility]   Reason for Consult Admit      08/19/19 2249           Significant initial  Findings: Abnormal Labs Reviewed  CBC WITH DIFFERENTIAL/PLATELET - Abnormal; Notable for the following components:      Result Value   RBC 3.37 (*)    Hemoglobin 10.3 (*)    HCT 31.4 (*)    Abs Immature Granulocytes 0.17 (*)    All other components within normal limits  COMPREHENSIVE METABOLIC PANEL - Abnormal; Notable for the following components:   Glucose, Bld 193 (*)    BUN 28 (*)    Calcium 8.3 (*)    Total Protein 5.6 (*)    Albumin 2.5 (*)    All other components within normal limits  D-DIMER, QUANTITATIVE (NOT AT [facility]) - Abnormal; Notable for the following components:   D-Dimer, Quant 1.61 (*)    All other components within normal limits  FERRITIN - Abnormal; Notable for the following components:   Ferritin 570 (*)    All other components within normal limits  FIBRINOGEN -  Abnormal; Notable for the following components:   Fibrinogen 710 (*)    All other components within normal limits  C-REACTIVE PROTEIN - Abnormal; Notable for the following components:   CRP 22.9 (*)    All other components within normal limits  URINALYSIS, ROUTINE W REFLEX MICROSCOPIC - Abnormal; Notable for the following components:   APPearance HAZY (*)    Glucose, UA >=500 (*)    Hgb urine dipstick SMALL (*)    Protein, ur 30 (*)    Nitrite POSITIVE (*)    Leukocytes,Ua SMALL (*)    WBC, UA >50 (*)    Bacteria, UA MANY (*)    All other components within normal limits    Otherwise labs showing:  Recent Labs  Lab 08/19/19 2031  NA 135  K 3.5  CO2 22  GLUCOSE 193*  BUN 28*  CREATININE 0.63  CALCIUM 8.3*    Cr   Stable,  Lab Results  Component Value Date   CREATININE 0.63 08/19/2019   CREATININE 0.65 (L) 08/05/2019   CREATININE 0.61 07/22/2019    Recent Labs  Lab 08/19/19 2031  AST 17  ALT 33  ALKPHOS 96  BILITOT 0.6  PROT 5.6*  ALBUMIN 2.5*   Lab Results  Component Value Date   CALCIUM 8.3 (L) 08/19/2019   PHOS 3.0 07/16/2019   WBC       Component Value Date/Time   WBC 9.4 08/19/2019 2031   ANC    Component Value Date/Time   NEUTROABS 7.5 08/19/2019 2031   ALC No components found for: LYMPHAB    Plt: Lab Results  Component Value Date   PLT 160 08/19/2019     Lactic Acid, Venous    Component Value Date/Time   LATICACIDVEN 1.5 08/19/2019 2031      COVID-19 Labs  Recent Labs    08/19/19 2031  DDIMER 1.61*  FERRITIN 570*  LDH 144  CRP 22.9*    Lab Results  Component Value Date   SARSCOV2NAA POSITIVE (A) 08/07/2019   SARSCOV2NAA POSITIVE (A) 07/15/2019     HG/HCT   stable,      Component Value Date/Time   HGB 10.3 (L) 08/19/2019 2031   HCT 31.4 (L) 08/19/2019 2031     Troponin  ordered Cardiac Panel (last 3 results) No results for input(s): CKTOTAL, CKMB, TROPONINI, RELINDX in the last 72 hours.     ECG:  Ordered Personally reviewed by me showing: HR : 73 Rhythm: NSR,    no evidence of ischemic changes QTC 385    BNP (last 3 results) No results for input(s): BNP in the last 8760 hours.  ProBNP (last 3 results) No results for input(s): PROBNP in the last 8760 hours.  DM  labs:  HbA1C: Recent Labs    07/16/19 1513  HGBA1C 6.3*    CBG (last 3)  No results for input(s): GLUCAP in the last 72 hours.     UA    evidence of UTI     Urine analysis:    Component Value Date/Time   COLORURINE YELLOW 08/19/2019 2031   APPEARANCEUR HAZY (A) 08/19/2019 2031   LABSPEC 1.023 08/19/2019 2031   PHURINE 5.0 08/19/2019 2031   GLUCOSEU >=500 (A) 08/19/2019 2031   HGBUR SMALL (A) 08/19/2019 2031   BILIRUBINUR NEGATIVE 08/19/2019 2031   BILIRUBINUR negative 10/13/2012 1010   KETONESUR NEGATIVE 08/19/2019 2031   PROTEINUR 30 (A) 08/19/2019 2031   UROBILINOGEN 1.0 08/19/2019 1312   NITRITE POSITIVE (A) 08/19/2019 2031   LEUKOCYTESUR SMALL (A) 08/19/2019 2031    CXR - improving  CTabd/pelvis -  Moderate right subcapsular renal hematoma with additional hemorrhage and stranding present in the perirenal and pararenal retroperitoneal spaces    ED Triage Vitals  Enc Vitals Group     BP 08/19/19 1750 107/78     Pulse Rate 08/19/19 1750 (!) 127     Resp 08/19/19 1750 16     Temp 08/19/19 1750 98.9 F (37.2 C)     Temp Source 08/19/19 1750 Oral     SpO2 08/19/19 1750 98 %     Weight --      Height --      Head Circumference --      Peak  Flow --      Pain Score 08/19/19 1804 0     Pain Loc --      Pain Edu? --      Excl. in [place]? --   TMAX(24)@       Latest  Blood pressure 140/67, pulse 78, temperature 98.7 F (37.1 C), temperature source Oral, resp. rate 18, SpO2 97 %.    Hospitalist was called for admission for Moderate right subcapsular renal hematoma with additional hemorrhage and stranding present in the perirenal and pararenal retroperitoneal spaces   Review of Systems:     Pertinent positives include:    Constitutional:  No weight loss, night sweats, Fevers, chills, fatigue, weight loss  HEENT:  No headaches, Difficulty swallowing,Tooth/dental problems,Sore throat,  No sneezing, itching, ear ache, nasal congestion, post nasal drip    Cardio-vascular:  No chest pain, Orthopnea, PND, anasarca, dizziness, palpitations.no Bilateral lower extremity swelling  GI:  No heartburn, indigestion, abdominal pain, nausea, vomiting, diarrhea, change in bowel habits, loss of appetite, melena, blood in stool, hematemesis Resp:  no shortness of breath at rest. No dyspnea on exertion, No excess mucus, no productive cough, No non-productive cough, No coughing up of blood.No change in color of mucus.No wheezing. Skin:  no rash or lesions. No jaundice GU:  no dysuria, change in color of urine, no urgency or frequency. No straining to urinate.  No flank pain.  Musculoskeletal:  No joint pain or no joint swelling. No decreased range of motion. No back pain.  Psych:  No change in mood or affect. No depression or anxiety. No memory loss.  Neuro: no localizing neurological complaints, no tingling, no weakness, no double vision, no gait abnormality, no slurred speech, no confusion  All systems reviewed and apart from [place] all are negative  Past Medical History:   Past Medical History:  Diagnosis Date   Arthritis    shoulders, back   BPH (benign prostatic hypertrophy)    takes Proscar daily   Diverticulosis    History of 2019 novel coronavirus disease (COVID-19) 08/05/2019   Required hospitalization discharged August 2020   History of colon polyps    Hyperlipidemia    takes Atorvastatin daily   Internal hemorrhoid    LBP (low back pain)    HNP   Nocturia    Numbness and tingling    in right leg      Past Surgical History:  Procedure Laterality Date   BACK SURGERY     COLONOSCOPY     CYSTOSCOPY WITH BIOPSY N/A 07/19/2013   Procedure:  TRANSURETHRAL RESECTION OF BLADDER NECK;  Surgeon: Bernestine Amass, MD;  [facility];  Service: Urology;  Laterality: N/A;   LUMBAR LAMINECTOMY Right 08/12/2014   Procedure: Right L2-3 Hemilaminectomy, Removal HNP;  Surgeon: Marybelle Killings, MD;  Location: [facility];  Service: Orthopedics;  Laterality: Right;   LUMBAR SPINE SURGERY     x 2   NASAL SINUS SURGERY  40 yrs ago   PROSTATE BIOPSY  2007, 2010   benign.  Urology - Dr. Ellwood Sayers   ROTATOR CUFF REPAIR Bilateral    total of 3   TRANSURETHRAL RESECTION OF PROSTATE  10-23-2009    Social History:  Ambulatory independently      reports that he has quit smoking. He has never used smokeless tobacco. He reports current alcohol use. He reports that he does not use drugs.     Family History:   Family History  Problem Relation  Age of Onset   Stomach cancer Brother    Colon polyps Neg Hx    Esophageal cancer Neg Hx    Rectal cancer Neg Hx    Pancreatic cancer Neg Hx     Allergies: Allergies  Allergen Reactions   Other Other (See Comments)     Raw Apples swells throat     Prior to Admission medications   Medication Sig Start Date End Date Taking? Authorizing Provider  atorvastatin (LIPITOR) 80 MG tablet Take 40 mg by mouth daily.  07/23/19   [provider]  cephALEXin (KEFLEX) 500 MG capsule Take 1 capsule (500 mg total) by mouth 4 (four) times daily. Patient not taking: Reported on 08/19/2019 08/07/19   Lily Kocher, PA-C  finasteride (PROSCAR) 5 MG tablet Take 1 tablet (5 mg total) by mouth daily. 08/05/19   Gregor Hams, MD  nitrofurantoin, macrocrystal-monohydrate, (MACROBID) 100 MG capsule Take 1 capsule (100 mg total) by mouth 2 (two) times daily. Patient not taking: Reported on 08/19/2019 08/19/19   Gregor Hams, MD  predniSONE (DELTASONE) 20 MG tablet Take 2 tablets (40 mg total) by mouth daily. 08/07/19   Lily Kocher, PA-C   Physical Exam: Blood pressure 140/67, pulse 78,  temperature 98.7 F (37.1 C), temperature source Oral, resp. rate 18, SpO2 97 %. 1. General:  in No Acute distress   well  -appearing 2. Psychological: Alert and  Oriented 3. Head/ENT:    Dry Mucous Membranes                          Head Non traumatic, neck supple                           Poor Dentition 4. SKIN:  decreased Skin turgor,  Skin clean Dry and intact no rash 5. Heart: Regular rate and rhythm no  Murmur, no Rub or gallop 6. Lungs:  no wheezes or crackles   7. Abdomen: Soft,  right flank tender, Non distended  bowel sounds present 8. Lower extremities: no clubbing, cyanosis, no  edema 9. Neurologically Grossly intact, moving all 4 extremities equally   10. MSK: Normal range of motion   All other LABS:     Recent Labs  Lab 08/19/19 2031  WBC 9.4  NEUTROABS 7.5  HGB 10.3*  HCT 31.4*  MCV 93.2  PLT 160     Recent Labs  Lab 08/19/19 2031  NA 135  K 3.5  CL 104  CO2 22  GLUCOSE 193*  BUN 28*  CREATININE 0.63  CALCIUM 8.3*     Recent Labs  Lab 08/19/19 2031  AST 17  ALT 33  ALKPHOS 96  BILITOT 0.6  PROT 5.6*  ALBUMIN 2.5*      Cultures:    Component Value Date/Time   SDES  08/07/2019 2251    URINE, CLEAN CATCH Performed at [facility], [address] [place], [place] [address]    [place]  08/07/2019 2251    NONE Performed at [facility], [place] [address], [place], [place] [address]    CULT (A) 08/07/2019 2251    90,000 COLONIES/mL ESCHERICHIA COLI Confirmed Extended Spectrum Beta-Lactamase Producer (ESBL).  In bloodstream infections from ESBL organisms, carbapenems are preferred over piperacillin/tazobactam. They are shown to have a lower risk of mortality.    REPTSTATUS 08/10/2019 FINAL 08/07/2019 2251     Radiological Exams on Admission: Dg Chest [facility]  1 View  Result Date: 08/19/2019 CLINICAL DATA:  Covid positive EXAM: PORTABLE CHEST 1 VIEW COMPARISON:  August 07, 2019 FINDINGS: There is mild cardiomegaly. Streaky airspace  opacity seen within the right mid lung. Mildly increased ground-glass opacity seen at the right lung base. This appears to be slightly improved since the prior exam however. No pleural effusion. Bilateral shoulder arthropathy. IMPRESSION: Slight interval improvement in bilateral airspace opacity. Electronically Signed   By: Prudencio Pair M.D.   On: 08/19/2019 20:54   Ct Renal Stone Study  Result Date: 08/19/2019 CLINICAL DATA:  Right flank pain, recent COVID-19 diagnosis EXAM: CT ABDOMEN AND PELVIS WITHOUT CONTRAST TECHNIQUE: Multidetector CT imaging of the abdomen and pelvis was performed following the standard protocol without IV contrast. COMPARISON:  CT 08/10/2013 FINDINGS: Lower chest: Bibasilar areas of atelectasis. Stable calcified granuloma in the left lower lobe. Patchy areas of ground-glass opacity are present in both lung bases. There is a small right pleural effusion and adjacent passive atelectasis. Hepatobiliary: No focal liver abnormality is seen. No gallstones, gallbladder wall thickening, or biliary dilatation. Pancreas: Unremarkable. No pancreatic ductal dilatation or surrounding inflammatory changes. Spleen: Normal in size without focal abnormality. Adrenals/Urinary Tract: Normal adrenal glands. There is a hyperattenuating (60 HU) right perinephric hematoma with retroperitoneal hemorrhage within the perirenal and pararenal spaces. Evaluation for an underlying lesion is limited in the absence of contrast media. No visible or contour deforming right renal lesions are seen fluid attenuation cyst measuring 2.3 cm arising from the lower pole left kidney. Additional fluid attenuation 2 cm left renal sinus cyst. No hydronephrosis. Mild bladder wall thickening is present. Stomach/Bowel: Distal esophagus, stomach and duodenal sweep are unremarkable. No bowel wall thickening or dilatation. No evidence of obstruction. Scattered colonic diverticula without focal pericolonic inflammation to suggest  diverticulitis. A normal appendix is visualized. Vascular/Lymphatic: Atherosclerotic plaque within the normal caliber aorta. Hypoattenuation of the cardiac blood pool relative to the myocardium is suggestive of anemia. No suspicious or enlarged lymph nodes in the included lymphatic chains. Reproductive: [place]oka Lint defect the level of the prostate likely related to prior TURP. Other: Retroperitoneal hemorrhage, as above. No free intraperitoneal fluid. No free gas. Musculoskeletal: Multilevel degenerative changes of the spine. L4-5 posterior spinal fusion. Stepwise retrolisthesis L1-L3, similar to prior. No acute osseous abnormality or suspicious osseous lesion. IMPRESSION: 1. Moderate right subcapsular renal hematoma with additional hemorrhage and stranding present in the perirenal and pararenal retroperitoneal spaces. Possibly spontaneous in the absence of visible underlying lesion though evaluation is somewhat limited in the absence of contrast. 2. Small right pleural effusion with adjacent passive atelectasis. 3. Patchy areas of ground-glass opacity in both lung bases, which may reflect atelectasis or infection. 4. Stable calcified granuloma in the left lung base 5. Diverticulosis without evidence of diverticulitis. 6. Prior L4-5 posterior spinal fusion. 7. Aortic Atherosclerosis (ICD10-I70.0). These results were called by telephone at the time of interpretation on 08/19/2019 at 10:08 pm to provider [facility] , who verbally acknowledged these results. Electronically Signed   By: Lovena Le M.D.   On: 08/19/2019 22:08    Chart has been reviewed   Assessment/Plan   78 y.o. male with medical history significant of COVID +, BPH, diverticulosis, hyperlipidemia, Admitted for ESBL UTI and spontaneous Right subcapsular renal hematoma  Present on Admission:  Kidney hematoma -no definite etiology likely spontaneous unclear cause.  Supportive management if has more significant pain could reassess to see if there is  any progression.  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 Urology has been notified by ER provider please reconsult them if there is any questions per urology no indication for aggressive management at this time Treat underlying infection for 1 week longer so as before was planned to have 2 weeks now he would need 3 weeks of IV antibiotics   ESBL (extended spectrum beta-lactamase) producing bacteria infection -continue Zosyn would benefit from ID consult in a.m. would likely require longer term of IV antibiotics    Hyperlipidemia -chronic stable continue home medication   BPH (benign prostatic hyperplasia) -stable continue home medications   HTN (hypertension) -stable pt does not take any meds at baseline   History of 2019 novel coronavirus disease (COVID-19) -  Patient had recent fever although was most likely secondary to UTI for now continue precautions call with testing repeated pending he may be tested positive for up to 50 days since his illness as per ID  Anemia -unclear etiology we will continue to monitor to see if there is any progression given renal hematoma.  Obtain anemia panel and Hemoccult stool given somewhat elevated BUN    Protein-calorie malnutrition ([place]) would benefit from nutritional consult   Other plan as per orders.  DVT prophylaxis:  SCD    Code Status:  FULL CODE   as per patient   I had personally discussed CODE STATUS with patient    Family Communication:   Family not at  Bedside    Disposition Plan:    To home once workup is complete and patient is stable                                       Nutrition    consulted                                       Consults called:   UROLOGY was made aware please reconsult if  needed Please consult ID in AM    Admission status:  ED Disposition    None         inpatient     Expect 2 midnight stay secondary to severity of patient's current illness including     Severe lab/radiological/exam abnormalities including:  renal hematoma   and extensive comorbidities including: Hx of COVID That are currently affecting medical management.   I expect  patient to be hospitalized for 2 midnights requiring inpatient medical care.  Patient is at high risk for adverse outcome (such as loss of life or disability) if not treated.  Indication for inpatient stay as follows:    Hemodynamic instability despite maximal medical therapy,    severe pain requiring acute inpatient management,     Need for IV antibiotics, IV fluids,       Level of care     tele  For 12H   Precautions:   Airborne and Contact precautions  PPE: Used by the provider:   P100  eye Goggles,  Gloves  gown   Adalena Abdulla 08/20/2019, 12:27 AM    [facility]     after 2 AM please page floor coverage PA If 7AM-7PM, please contact the day team  taking care of the patient using [email or web address]

## 2019-08-19 NOTE — Telephone Encounter (Signed)
Called patient mobile number no answer and left VM to call office back as it was urgent. Called wife number as emergency contact no answer. Called daughter number under emergency contact and spoke with her and gave her the MD instructions and she is gonna call her dad and relay message to go to the emergency room. KG LPN

## 2019-08-19 NOTE — Progress Notes (Signed)
Jim Tucker is a 78 y.o. male who presents to [facility]: [place] today for flank pain.  Patient became somewhat ill and was seen again in the emergency department on August 29.  Chest x-ray showed improvement from prior x-ray from hospitalization.  COVID test was however also positive which is somewhat expected per infectious disease.  UA appeared to be concerning for urinary tract infection.  Patient was discharged with Keflex for possible UTI.  Urinalysis grew E. coli resistant to first generation cephalosporins as well as amoxicillin ceftriaxone ciprofloxacin and Bactrim.  ESBL positive.  Sensitive to gentamicin Zosyn, nitrofurantoin.  ED attempted to call patient 3 times on September 4 but was unable to reach patient.   In the interim [name] notes pain in the right low back/right flank.  He denies fevers but notes that he feels somewhat poorly.  He notes urinary frequency and urgency.  He thinks he probably has a urinary tract infection.   ROS as above:  Exam:  BP 133/74   Pulse (!) 117   Temp 98.2 F (36.8 C) (Oral)   Wt 122 lb (55.3 kg)   SpO2 92%   BMI 20.30 kg/m  Wt Readings from Last 5 Encounters:  08/19/19 122 lb (55.3 kg)  08/05/19 117 lb (53.1 kg)  07/15/19 120 lb (54.4 kg)  07/15/19 130 lb (59 kg)  08/21/18 137 lb (62.1 kg)    Gen: Well NAD HEENT: EOMI,  MMM Lungs: Normal work of breathing. CTABL Heart: RRR no MRG heart rate 88 bpm per my check Abd: NABS, Soft. Nondistended, Nontender.  Mild tender to palpation/percussion right CVA angle Exts: Brisk capillary refill, warm and well perfused.   Lab and Radiology Results  Results for orders placed or performed in visit on 08/19/19 (from the past 24 hour(s))  POCT UA - Glucose/Protein     Status: Abnormal   Collection Time: 08/19/19  1:12 PM  Result Value Ref Range   Glucose, UA Negative Negative   Protein, UA Positive (A) Negative   Bilirubin small    Ketone trace    Specific Gravity 1.020    Blood trace-lysed    pH 6.0    Urobilinogen, UA 1.0 0.2 or 1.0 E.U./dL   Nitrite positive    Leukocytes,Ua trace       12d ago  Specimen Description URINE, CLEAN CATCH  Performed at [facility], [address] [place], [place] [address]   Special Requests NONE  Performed at [facility], [place] [address], [place], [place] [address]   Culture Abnormal  90,000 COLONIES/mL ESCHERICHIA COLI  Confirmed Extended Spectrum Beta-Lactamase Producer (ESBL). In bloodstream infections from ESBL organisms, carbapenems are preferred over piperacillin/tazobactam. They are shown to have a lower risk of mortality.    Report Status 08/10/2019 FINAL   Organism ID, Bacteria ESCHERICHIA COLIAbnormal    Resulting Agency [facility]  Susceptibility   Escherichia coli    MIC    AMPICILLIN >=32 RESIST... Resistant    AMPICILLIN/SULBACTAM >=32 RESIST... Resistant    CEFAZOLIN >=64 RESIST... Resistant    CEFTRIAXONE >=64 RESIST... Resistant    CIPROFLOXACIN >=4 RESISTANT  Resistant    Extended ESBL POSITIVE  Resistant    GENTAMICIN <=1 SENSITIVE  Sensitive    IMIPENEM <=0.25 SENS... Sensitive    NITROFURANTOIN <=16 SENSIT... Sensitive    PIP/TAZO <=4 SENSITIVE  Sensitive    TRIMETH/SULFA >=320 RESIS... Resistant  Susceptibility Comments  Escherichia coli       Assessment and Plan: 78 y.o. male with complicated UTI likely secondary to E. Coli. ESBL.  During visit I prescribed nitrofurantoin is that the only oral antibiotic that this bacteria is sensitive to.  Additionally repeated urine culture and obtain labs for CBC metabolic panel sed rate and blood culture x1.  Additionally referred to infectious disease.  After discharge I was able to contact the on-call infectious disease doctor Dr. Megan Salon.  After discussion he is concerned for pyelonephritis which certainly is reasonable and  recommends return to ED.  Patient will likely benefit from admission for initiation of IV antibiotics and starting PICC line for outpatient IV antibiotic therapy likely with ertapenem.   We will contact patient today to inform him of the plan.  He understands this was a possibility after he left the clinic today.    PDMP not reviewed this encounter. Orders Placed This Encounter  Procedures  . Urine Culture    Report Status 08/10/2019 FINAL  Organism ID, Bacteria ESCHERICHIA COLIAbnormal   Resulting Agency [facility] Susceptibility    Escherichia coli   MIC   AMPICILLIN >=32 RESIST... Resistant   AMPICILLIN/SULBACTAM >=32 RESIST... Resistant   CEFAZOLIN >=64 RESIST... Resistant   CEFTRIAXONE >=64 RESIST... Resistant   CIPROFLOXACIN >=4 RESISTANT  Resistant   Extended ESBL POSITIVE  Resistant   GENTAMICIN <=1 SENSITIVE  Sensitive   IMIPENEM <=0.25 SENS... Sensitive   NITROFURANTOIN <=16 SENSIT... Sensitive   PIP/TAZO <=4 SENSITIVE  Sensitive   TRIMETH/SULFA >=320 RESIS... Resistant       Susceptibility Comments   Escherichia coli 90,000 COLONIES/mL ESCHERICHIA COLI    Order Specific Question:   Source    Answer:   Urine. Recent culture Ecoli ESBL. Extend culture sensitivity if able.  . Blood culture (routine single)  . CBC with Differential/Platelet  . COMPLETE METABOLIC PANEL WITH GFR  . Sedimentation rate  . Ambulatory referral to Infectious Disease    Referral Priority:   Urgent    Referral Type:   Consultation    Referral Reason:   Specialty Services Required    Requested Specialty:   Infectious Diseases    Number of Visits Requested:   1  . POCT UA - Glucose/Protein   Meds ordered this encounter  Medications  . nitrofurantoin, macrocrystal-monohydrate, (MACROBID) 100 MG capsule    Sig: Take 1 capsule (100 mg total) by mouth 2 (two) times daily.    Dispense:  40 capsule    Refill:  0     Historical information moved to improve visibility of documentation.   Past Medical History:  Diagnosis Date  . Arthritis    shoulders, back  . BPH (benign prostatic hypertrophy)    takes Proscar daily  . Diverticulosis   . History of 2019 novel coronavirus disease (COVID-19) 08/05/2019   Required hospitalization discharged August 2020  . History of colon polyps   . Hyperlipidemia    takes Atorvastatin daily  . Internal hemorrhoid   . LBP (low back pain)    HNP  . Nocturia   . Numbness and tingling    in right leg   Past Surgical History:  Procedure Laterality Date  . BACK SURGERY    . COLONOSCOPY    . CYSTOSCOPY WITH BIOPSY N/A 07/19/2013   Procedure: TRANSURETHRAL RESECTION OF BLADDER NECK;  Surgeon: Bernestine Amass, MD;  Location: [facility];  Service: Urology;  Laterality: N/A;  . LUMBAR LAMINECTOMY Right 08/12/2014  Procedure: Right L2-3 Hemilaminectomy, Removal HNP;  Surgeon: Marybelle Killings, MD;  Location: [facility];  Service: Orthopedics;  Laterality: Right;  . LUMBAR SPINE SURGERY     x 2  . NASAL SINUS SURGERY  40 yrs ago  . PROSTATE BIOPSY  2007, 2010   benign.  Urology - Dr. Ellwood Sayers  . ROTATOR CUFF REPAIR Bilateral    total of 3  . TRANSURETHRAL RESECTION OF PROSTATE  10-23-2009   Social History   Tobacco Use  . Smoking status: Former Research scientist (life sciences)  . Smokeless tobacco: Never Used  . Tobacco comment: quit smoking in Mar 2015  Substance Use Topics  . Alcohol use: Yes    Comment: wine occasionally   family history includes Stomach cancer in his brother.  Medications: Current Outpatient Medications  Medication Sig Dispense Refill  . atorvastatin (LIPITOR) 80 MG tablet Take 40 mg by mouth daily.     . finasteride (PROSCAR) 5 MG tablet Take 1 tablet (5 mg total) by mouth daily. 90 tablet 3  . predniSONE (DELTASONE) 20 MG tablet Take 2 tablets (40 mg total) by mouth daily. 10 tablet 0  . cephALEXin (KEFLEX) 500 MG capsule Take 1 capsule (500 mg total) by mouth 4 (four) times daily. (Patient not taking: Reported on 08/19/2019)  20 capsule 0  . nitrofurantoin, macrocrystal-monohydrate, (MACROBID) 100 MG capsule Take 1 capsule (100 mg total) by mouth 2 (two) times daily. (Patient not taking: Reported on 08/19/2019) 40 capsule 0   No current facility-administered medications for this visit.    Allergies  Allergen Reactions  . Other Other (See Comments)     Raw Apples swells throat     Discussed warning signs or symptoms. Please see discharge instructions. Patient expresses understanding.

## 2019-08-19 NOTE — ED Notes (Signed)
Pt again asking where the Doctor was. I explained that a Doctor has not yet signed up and we are still waiting. The pt responded that "this is BS". I stated that I understand his frustration and asked if there was anything else he needed or wanted. The denied any wants/needs. Pt is not in any obvious respiratory distress, able to talk in complete sentences without any SOB, alert and orientated, and has proper skin color for ethnicity.

## 2019-08-19 NOTE — ED Notes (Signed)
After pt was brought into his room, pt came out of the room and began trying to enter the bathroom. I asked the pt to return to his room because he has tested positive for Covid, pt then stated that he needed to use the bathroom. I informed the pt that we were already working on bring something to his room for him to use the bathroom isolated in his room, and again asked him to return the room. Pt began repeating that he needed to use the bathroom and one other sentence which was incomprehensible. I again stated I understood but he needed to return to his room and would bring him something to use in there. Pt then began to argue until the NT arrived with a urinal for the pt. Pt at which point returned to the room.

## 2019-08-19 NOTE — Patient Instructions (Signed)
Thank you for coming in today. You should hear from the infectious disease doctor soon.  Start antibiotic now.  Get blood work.  Keep me updated.  Make sure to check your phone for missed calls of voice mail.  Let me know if you do not hear about appointment with infectious disease doctor.    Pielonefritis en los adultos Pyelonephritis, Adult  La pielonefritis es una infeccin que se produce en el rin. Los riones son los rganos que ayudan a limpiar la sangre al [employer] residuos a travs de la [place]. Esta infeccin puede curarse rpidamente o durar [employer]. En la [employer], desaparece con el tratamiento y no causa otros problemas. Cules son las causas? Esta afeccin puede ser causada por lo siguiente:  Grmenes (bacterias) que se trasladan de la vejiga al rin. Esto puede suceder despus de tener una infeccin en la vejiga.  Grmenes que se trasladan de la sangre al rin. Qu incrementa el riesgo? Es ms probable que esta afeccin se manifieste en:  Mujeres embarazadas.  Personas de edad avanzada.  Personas que tienen alguna de estas afecciones: ? Diabetes. ? Inflamacin de la prstata (prostatitis) en los hombres. ? Clculos renales o en la vejiga. ? Otros problemas en el rin o en las partes del cuerpo que transportan la orina desde los riones hasta la vejiga (urteres). ? Cncer.  Las [employer] tienen un tubo delgado y pequeo (catter) colocado en la vejiga.  Las personas que son sexualmente activas.  Las mujeres que usan un medicamento que [employer] espermatozoides (espermicida) para Therapist, occupational.  Las personas que han tenido una infeccin urinaria (IU) previa. Cules son los signos o los sntomas? Los sntomas de esta afeccin incluyen:  Hacer pis con frecuencia.  Necesidad intensa de Garment/textile technologist de inmediato.  Sensacin de ardor o escozor al [employer].  Dolor abdominal.  Dolor de espalda.  Dolor al costado del cuerpo  (fosa lumbar).  Fiebre o escalofros.  Sangre en la [place].  Malestar estomacal (nuseas) o ganas de devolver (vmitos). Cmo se trata? El tratamiento para esta afeccin puede incluir lo siguiente:  Tomar antibiticos por boca (va oral).  Beber la cantidad suficiente de lquido. Si la infeccin es grave, es posible que [facility]. Pueden darle antibiticos y lquidos que se colocan directamente en una vena a travs de un tubo (catter) intravenoso. En algunos casos, pueden necesitarse otros tratamientos. Siga estas instrucciones en su casa: [place] los [employer] se lo haya indicado el mdico. No deje de tomar el antibitico aunque comience a sentirse mejor.  Tome los medicamentos de venta libre y los recetados solamente como se lo haya indicado el mdico. Instrucciones generales   Beba suficiente lquido como para Theatre manager la orina de color [place] plido.  Evite la cafena, el t y las bebidas gaseosas.  Orine con frecuencia. Evite retener la orina durante largos perodos.  Orine antes y despus Spray.  Despus de las deposiciones, las mujeres deben higienizarse desde adelante hacia atrs. Use slo un papel tissue por vez.  Concurra a todas las visitas de seguimiento como se lo haya indicado el mdico. Esto es importante. Comunquese con un mdico si:  No mejora luego de 2 das.  Sus sntomas empeoran.  Tiene fiebre. Solicite ayuda inmediatamente si:  No puede tomar los medicamentos o beber lquidos segn las indicaciones.  Siente [place] a Emergency planning/management officer.  [place].  Tiene Location manager  intenso en el costado o en la espalda.  Se siente muy dbil o se desvanece (se desmaya). Resumen  La pielonefritis es una infeccin que se produce en el rin.  En la [employer], esta infeccin desaparece con el tratamiento y no causa otros problemas.  Tome los antibiticos como se  lo haya indicado el mdico. No deje de tomar el antibitico aunque comience a sentirse mejor.  Beba suficiente lquido como para Theatre manager la orina de color am[place]illo plido. Esta informacin no tiene Marine scientist el consejo del mdico. Asegrese de hacerle al mdico cualquier pregunta que tenga. Document Released: 11/25/2005 Document Revised: 11/06/2018 Document Reviewed: 11/06/2018 Elsevier Patient Education  2020 [employer].

## 2019-08-19 NOTE — ED Notes (Signed)
Pt called out. From the doorway I asked what the pt needed. The pt asked for a cup of water. I had difficulty understanding the pt and confirmed his request by once again asking if he wanted water. The pt stated "did I say something else, yes, bring me ice water". I then replied that I could not bring him water until a doctor has evaluated him. The pt then asked when a doctor would be in to see him. I replied that it would be as soon as they are available, but I couldn't give a specific time.

## 2019-08-19 NOTE — ED Provider Notes (Signed)
[facility] DEPT Provider Note   CSN: [id number] Arrival date & time: 08/19/19  1742     History   Chief Complaint Chief Complaint  Patient presents with  . needs IV antibiotics  . covid pos    HPI Jim Tucker is a 78 y.o. male with history of BPH, hyperlipidemia presents for evaluation of acute onset, intermittent right flank pain since yesterday.  He reports that he has had urinary urgency and frequency for the last 2 weeks and yesterday noted intermittent sharp pain to the right flank which radiates to the right side of the abdomen, worsens with cough or certain movements.  Denies dysuria or hematuria.  He denies any fever, chest pain, shortness of breath.  He did recently test positive for COVID-19 infection.  Due to his worsening symptoms he followed up with his PCP today.  Patient was seen and evaluated on 08/07/2019 for similar symptoms, discharged home with a course of Keflex for UTI.  His UA which was reviewed by his PCP today grew E. coli resistant to multiple antibiotics but was sensitive to gentamicin, Zosyn, nitrofurantoin.  The ED attempted to call the patient multiple times but was unable to reach the patient.  His PCP spoke with infectious disease specialist who recommended presentation to the ED for IV antibiotics and PICC line as nitrofurantoin would not be an appropriate antibiotic to the use for what sounds like complicated UTI (likely pyelonephritis in the setting of flank pain).   y   The history is provided by the patient.    Past Medical History:  Diagnosis Date  . Arthritis    shoulders, back  . BPH (benign prostatic hypertrophy)    takes Proscar daily  . Diverticulosis   . History of 2019 novel coronavirus disease (COVID-19) 08/05/2019   Required hospitalization discharged August 2020  . History of colon polyps   . Hyperlipidemia    takes Atorvastatin daily  . Internal hemorrhoid   . LBP (low back pain)    HNP  .  Nocturia   . Numbness and tingling    in right leg    Patient Active Problem List   Diagnosis Date Noted  . ESBL (extended spectrum beta-lactamase) producing bacteria infection 08/19/2019  . Kidney hematoma 08/19/2019  . History of 2019 novel coronavirus disease (COVID-19) 08/05/2019  . Protein-calorie malnutrition ([place]) 08/05/2019  . Respiratory tract infection due to COVID-19 virus 07/15/2019  . Internal hemorrhoid 10/15/2018  . Onychomycosis 08/12/2018  . Prediabetes 05/12/2018  . HTN (hypertension) 02/25/2017  . HNP (herniated nucleus pulposus), lumbar 08/08/2014  . COLONIC POLYPS 03/28/2008  . DIVERTICULOSIS OF COLON 03/28/2008  . BPH (benign prostatic hyperplasia) 09/10/2007  . PTERYGIUM NOS 03/02/2007  . DYSPEPSIA 12/03/2006  . SEBORRHEIC KERATOSIS 11/18/2006  . Hyperlipidemia 11/13/2006  . DYSRHYTHMIA, CARDIAC NOS 11/13/2006    Past Surgical History:  Procedure Laterality Date  . BACK SURGERY    . COLONOSCOPY    . CYSTOSCOPY WITH BIOPSY N/A 07/19/2013   Procedure: TRANSURETHRAL RESECTION OF BLADDER NECK;  Surgeon: Bernestine Amass, MD;  Location: [facility];  Service: Urology;  Laterality: N/A;  . LUMBAR LAMINECTOMY Right 08/12/2014   Procedure: Right L2-3 Hemilaminectomy, Removal HNP;  Surgeon: Marybelle Killings, MD;  Location: [facility];  Service: Orthopedics;  Laterality: Right;  . LUMBAR SPINE SURGERY     x 2  . NASAL SINUS SURGERY  40 yrs ago  . PROSTATE BIOPSY  2007, 2010   benign.  Urology -  Dr. Ellwood Sayers  . ROTATOR CUFF REPAIR Bilateral    total of 3  . TRANSURETHRAL RESECTION OF PROSTATE  10-23-2009        Home Medications    Prior to Admission medications   Medication Sig Start Date End Date Taking? Authorizing Provider  atorvastatin (LIPITOR) 80 MG tablet Take 40 mg by mouth daily.  07/23/19  Yes [provider]  finasteride (PROSCAR) 5 MG tablet Take 1 tablet (5 mg total) by mouth daily. 08/05/19  Yes Gregor Hams, MD  cephALEXin  (KEFLEX) 500 MG capsule Take 1 capsule (500 mg total) by mouth 4 (four) times daily. Patient not taking: Reported on 08/19/2019 08/07/19   Lily Kocher, PA-C  nitrofurantoin, macrocrystal-monohydrate, (MACROBID) 100 MG capsule Take 1 capsule (100 mg total) by mouth 2 (two) times daily. Patient not taking: Reported on 08/19/2019 08/19/19   Gregor Hams, MD  predniSONE (DELTASONE) 20 MG tablet Take 2 tablets (40 mg total) by mouth daily. Patient not taking: Reported on 08/19/2019 08/07/19   Lily Kocher, PA-C    Family History Family History  Problem Relation Age of Onset  . Stomach cancer Brother   . Colon polyps Neg Hx   . Esophageal cancer Neg Hx   . Rectal cancer Neg Hx   . Pancreatic cancer Neg Hx     Social History Social History   Tobacco Use  . Smoking status: Former Research scientist (life sciences)  . Smokeless tobacco: Never Used  . Tobacco comment: quit smoking in Mar 2015  Substance Use Topics  . Alcohol use: Yes    Comment: wine occasionally  . Drug use: No     Allergies   Other   Review of Systems Review of Systems  Constitutional: Negative for chills and fever.  Respiratory: Negative for shortness of breath.   Cardiovascular: Negative for chest pain.  Gastrointestinal: Negative for abdominal pain, nausea and vomiting.  Genitourinary: Positive for difficulty urinating, flank pain, frequency and urgency. Negative for dysuria and hematuria.  All other systems reviewed and are negative.    Physical Exam Updated Vital Signs BP 114/72   Pulse 87   Temp 98.7 F (37.1 C) (Oral)   Resp (!) 21   SpO2 92%   Physical Exam Vitals signs and nursing note reviewed.  Constitutional:      General: He is not in acute distress.    Appearance: He is well-developed.  HENT:     Head: Normocephalic and atraumatic.  Eyes:     General:        Right eye: No discharge.        Left eye: No discharge.     Conjunctiva/sclera: Conjunctivae normal.  Neck:     Musculoskeletal: Neck supple.      Vascular: No JVD.     Trachea: No tracheal deviation.  Cardiovascular:     Rate and Rhythm: Normal rate and regular rhythm.     Pulses: Normal pulses.     Heart sounds: Normal heart sounds.  Pulmonary:     Effort: Pulmonary effort is normal.     Breath sounds: Normal breath sounds.  Abdominal:     General: Abdomen is flat. Bowel sounds are normal. There is no distension.     Palpations: Abdomen is soft.     Tenderness: There is abdominal tenderness in the right upper quadrant, right lower quadrant, epigastric area, periumbilical area and suprapubic area. There is right CVA tenderness. There is no guarding or rebound.  Skin:    General:  Skin is warm and dry.     Findings: No erythema.  Neurological:     Mental Status: He is alert.  Psychiatric:        Behavior: Behavior normal.      ED Treatments / Results  Labs (all labs ordered are listed, but only abnormal results are displayed) Labs Reviewed  CBC WITH DIFFERENTIAL/PLATELET - Abnormal; Notable for the following components:      Result Value   RBC 3.37 (*)    Hemoglobin 10.3 (*)    HCT 31.4 (*)    Abs Immature Granulocytes 0.17 (*)    All other components within normal limits  COMPREHENSIVE METABOLIC PANEL - Abnormal; Notable for the following components:   Glucose, Bld 193 (*)    BUN 28 (*)    Calcium 8.3 (*)    Total Protein 5.6 (*)    Albumin 2.5 (*)    All other components within normal limits  D-DIMER, QUANTITATIVE (NOT AT [facility]) - Abnormal; Notable for the following components:   D-Dimer, Quant 1.61 (*)    All other components within normal limits  FERRITIN - Abnormal; Notable for the following components:   Ferritin 570 (*)    All other components within normal limits  FIBRINOGEN - Abnormal; Notable for the following components:   Fibrinogen 710 (*)    All other components within normal limits  C-REACTIVE PROTEIN - Abnormal; Notable for the following components:   CRP 22.9 (*)    All other components within  normal limits  URINALYSIS, ROUTINE W REFLEX MICROSCOPIC - Abnormal; Notable for the following components:   APPearance HAZY (*)    Glucose, UA >=500 (*)    Hgb urine dipstick SMALL (*)    Protein, ur 30 (*)    Nitrite POSITIVE (*)    Leukocytes,Ua SMALL (*)    WBC, UA >50 (*)    Bacteria, UA MANY (*)    All other components within normal limits  SARS CORONAVIRUS 2 (HOSPITAL ORDER, [facility])  CULTURE, BLOOD (ROUTINE X 2)  CULTURE, BLOOD (ROUTINE X 2)  LACTIC ACID, PLASMA  PROCALCITONIN  LACTATE DEHYDROGENASE  TRIGLYCERIDES  LACTIC ACID, PLASMA  PROTIME-INR  APTT    EKG EKG Interpretation  Date/Time:  Thursday August 19 2019 21:13:37 EDT Ventricular Rate:  73 PR Interval:    QRS Duration: 94 QT Interval:  349 QTC Calculation: 385 R Axis:   -20 Text Interpretation:  Sinus rhythm Atrial premature complex Abnormal R-wave progression, early transition LVH by voltage Confirmed by Virgel Manifold [phone]) on 08/19/2019 11:49:02 PM   Radiology Dg Chest Port 1 View  Result Date: 08/19/2019 CLINICAL DATA:  Covid positive EXAM: PORTABLE CHEST 1 VIEW COMPARISON:  August 07, 2019 FINDINGS: There is mild cardiomegaly. Streaky airspace opacity seen within the right mid lung. Mildly increased ground-glass opacity seen at the right lung base. This appears to be slightly improved since the prior exam however. No pleural effusion. Bilateral shoulder arthropathy. IMPRESSION: Slight interval improvement in bilateral airspace opacity. Electronically Signed   By: Prudencio Pair M.D.   On: 08/19/2019 20:54   Ct Renal Stone Study  Result Date: 08/19/2019 CLINICAL DATA:  Right flank pain, recent COVID-19 diagnosis EXAM: CT ABDOMEN AND PELVIS WITHOUT CONTRAST TECHNIQUE: Multidetector CT imaging of the abdomen and pelvis was performed following the standard protocol without IV contrast. COMPARISON:  CT 08/10/2013 FINDINGS: Lower chest: Bibasilar areas of atelectasis. Stable  calcified granuloma in the left lower lobe. Patchy areas of  ground-glass opacity are present in both lung bases. There is a small right pleural effusion and adjacent passive atelectasis. Hepatobiliary: No focal liver abnormality is seen. No gallstones, gallbladder wall thickening, or biliary dilatation. Pancreas: Unremarkable. No pancreatic ductal dilatation or surrounding inflammatory changes. Spleen: Normal in size without focal abnormality. Adrenals/Urinary Tract: Normal adrenal glands. There is a hyperattenuating (60 HU) right perinephric hematoma with retroperitoneal hemorrhage within the perirenal and pararenal spaces. Evaluation for an underlying lesion is limited in the absence of contrast media. No visible or contour deforming right renal lesions are seen fluid attenuation cyst measuring 2.3 cm arising from the lower pole left kidney. Additional fluid attenuation 2 cm left renal sinus cyst. No hydronephrosis. Mild bladder wall thickening is present. Stomach/Bowel: Distal esophagus, stomach and duodenal sweep are unremarkable. No bowel wall thickening or dilatation. No evidence of obstruction. Scattered colonic diverticula without focal pericolonic inflammation to suggest diverticulitis. A normal appendix is visualized. Vascular/Lymphatic: Atherosclerotic plaque within the normal caliber aorta. Hypoattenuation of the cardiac blood pool relative to the myocardium is suggestive of anemia. No suspicious or enlarged lymph nodes in the included lymphatic chains. Reproductive: Nyoka Lint defect the level of the prostate likely related to prior TURP. Other: Retroperitoneal hemorrhage, as above. No free intraperitoneal fluid. No free gas. Musculoskeletal: Multilevel degenerative changes of the spine. L4-5 posterior spinal fusion. Stepwise retrolisthesis L1-L3, similar to prior. No acute osseous abnormality or suspicious osseous lesion. IMPRESSION: 1. Moderate right subcapsular renal hematoma with additional hemorrhage  and stranding present in the perirenal and pararenal retroperitoneal spaces. Possibly spontaneous in the absence of visible underlying lesion though evaluation is somewhat limited in the absence of contrast. 2. Small right pleural effusion with adjacent passive atelectasis. 3. Patchy areas of ground-glass opacity in both lung bases, which may reflect atelectasis or infection. 4. Stable calcified granuloma in the left lung base 5. Diverticulosis without evidence of diverticulitis. 6. Prior L4-5 posterior spinal fusion. 7. Aortic Atherosclerosis (ICD10-I70.0). These results were called by telephone at the time of interpretation on 08/19/2019 at 10:08 pm to provider [facility] , who verbally acknowledged these results. Electronically Signed   By: Lovena Le M.D.   On: 08/19/2019 22:08    Procedures Procedures (including critical care time)  Medications Ordered in ED Medications  piperacillin-tazobactam (ZOSYN) IVPB 3.375 g (3.375 g Intravenous New Bag/Given 08/19/19 2217)  HYDROmorphone (DILAUDID) injection 0.5 mg (0.5 mg Intravenous Given 08/19/19 2217)     Initial Impression / Assessment and Plan / ED Course  I have reviewed the triage vital signs and the nursing notes.  Pertinent labs & imaging results that were available during my care of the patient were reviewed by me and considered in my medical decision making (see chart for details).        Jim Tucker was evaluated in Emergency Department on 08/19/2019 for the symptoms described in the history of present illness. He was evaluated in the context of the global COVID-19 pandemic, which necessitated consideration that the patient might be at risk for infection with the SARS-CoV-2 virus that causes COVID-19. Institutional protocols and algorithms that pertain to the evaluation of patients at risk for COVID-19 are in a state of rapid change based on information released by regulatory bodies including the CDC and federal and state  organizations. These policies and algorithms were followed during the patient's care in the ED.   Patient sent from PCP for evaluation of ESBL producing E. coli UTI.  He is afebrile, tachycardic on initial assessment with improvement  on subsequent reevaluation's.  He is fairly comfortable on evaluation with no peritoneal signs on examination of the abdomen.  He does have right CVA tenderness and right-sided abdominal tenderness.  Also recently tested positive for COVID-19 infection but denies cough, shortness of breath, or chest pain.  Lab work today reviewed by me shows no leukocytosis, anemia with hemoglobin of 10.3.  Creatinine within normal limits, BUN mildly elevated.  UA concerning for UTI.  Will start on IV Zosyn which most recent urine culture shows he is susceptible to.  Chest x-ray shows improving bilateral airspace opacities.  CT renal stone study shows a moderate right subcapsular renal hematoma with additional hemorrhage and stranding present in the perirenal and pararenal retroperitoneal spaces.  Also has atelectasis versus infection in both lung bases consistent with what was seen on chest x-ray. Radiologist cautions to monitor patient's blood pressure as he may develop Page's syndrome.   CONSULT: Spoke with Dr. Alyson Ingles with urology who reviewed the patient's images and states that there is not much to do from a urological perspective.  He does recommend placing the patient on a longer course of antibiotics.  Spoke with Dr. Roel Cluck who agrees to assume care of patient and bring him into the hospital for further evaluation and management.  Will add PT/INR.  Patient does not appear to be septic at this time.  Final Clinical Impressions(s) / ED Diagnoses   Final diagnoses:  Hematoma of kidney without rupture of capsule, right, initial encounter  ESBL (extended spectrum beta-lactamase) producing bacteria infection    ED Discharge Orders    None       Debroah Baller 08/20/19  0001    Virgel Manifold, MD 08/22/19 1352

## 2019-08-19 NOTE — Telephone Encounter (Signed)
Advised daughter to take him to [facility], per Maudie Mercury.

## 2019-08-20 ENCOUNTER — Inpatient Hospital Stay: Payer: Self-pay

## 2019-08-20 ENCOUNTER — Other Ambulatory Visit: Payer: Self-pay

## 2019-08-20 DIAGNOSIS — D509 Iron deficiency anemia, unspecified: Secondary | ICD-10-CM | POA: Diagnosis present

## 2019-08-20 DIAGNOSIS — S37011A Minor contusion of right kidney, initial encounter: Secondary | ICD-10-CM | POA: Diagnosis not present

## 2019-08-20 DIAGNOSIS — S37019A Minor contusion of unspecified kidney, initial encounter: Secondary | ICD-10-CM | POA: Diagnosis present

## 2019-08-20 LAB — PREALBUMIN: Prealbumin: 11.5 mg/dL — ABNORMAL LOW (ref 18–38)

## 2019-08-20 LAB — COMPREHENSIVE METABOLIC PANEL
ALT: 27 U/L (ref 0–44)
AST: 14 U/L — ABNORMAL LOW (ref 15–41)
Albumin: 2.2 g/dL — ABNORMAL LOW (ref 3.5–5.0)
Alkaline Phosphatase: 86 U/L (ref 38–126)
Anion gap: 11 (ref 5–15)
BUN: 28 mg/dL — ABNORMAL HIGH (ref 8–23)
CO2: 24 mmol/L (ref 22–32)
Calcium: 8.2 mg/dL — ABNORMAL LOW (ref 8.9–10.3)
Chloride: 102 mmol/L (ref 98–111)
Creatinine, Ser: 0.54 mg/dL — ABNORMAL LOW (ref 0.61–1.24)
GFR calc Af Amer: >60 mL/min (ref >60)
GFR calc non Af Amer: >60 mL/min (ref >60)
Glucose, Bld: 136 mg/dL — ABNORMAL HIGH (ref 70–99)
Potassium: 3.1 mmol/L — ABNORMAL LOW (ref 3.5–5.1)
Sodium: 137 mmol/L (ref 135–145)
Total Bilirubin: 0.8 mg/dL (ref 0.3–1.2)
Total Protein: 5.1 g/dL — ABNORMAL LOW (ref 6.5–8.1)

## 2019-08-20 LAB — CBC
HCT: 27.6 % — ABNORMAL LOW (ref 39.0–52.0)
Hemoglobin: 9 g/dL — ABNORMAL LOW (ref 13.0–17.0)
MCH: 30.6 pg (ref 26.0–34.0)
MCHC: 32.6 g/dL (ref 30.0–36.0)
MCV: 93.9 fL (ref 80.0–100.0)
Platelets: 153 10*3/uL (ref 150–400)
RBC: 2.94 MIL/uL — ABNORMAL LOW (ref 4.22–5.81)
RDW: 15.2 % (ref 11.5–15.5)
WBC: 7.1 10*3/uL (ref 4.0–10.5)
nRBC: 0 % (ref 0.0–0.2)

## 2019-08-20 LAB — IRON AND TIBC
Iron: 21 ug/dL — ABNORMAL LOW (ref 45–182)
Saturation Ratios: 10 % — ABNORMAL LOW (ref 17.9–39.5)
TIBC: 201 ug/dL — ABNORMAL LOW (ref 250–450)
UIBC: 180 ug/dL

## 2019-08-20 LAB — RETICULOCYTES
Immature Retic Fract: 17.1 % — ABNORMAL HIGH (ref 2.3–15.9)
RBC.: 2.94 MIL/uL — ABNORMAL LOW (ref 4.22–5.81)
Retic Count, Absolute: 47.6 10*3/uL (ref 19.0–186.0)
Retic Ct Pct: 1.6 % (ref 0.4–3.1)

## 2019-08-20 LAB — VITAMIN B12: Vitamin B-12: 565 pg/mL (ref 180–914)

## 2019-08-20 LAB — FOLATE: Folate: 9.7 ng/mL (ref >5.9)

## 2019-08-20 LAB — TSH: TSH: 0.748 u[IU]/mL (ref 0.350–4.500)

## 2019-08-20 LAB — PHOSPHORUS: Phosphorus: 2.6 mg/dL (ref 2.5–4.6)

## 2019-08-20 LAB — FERRITIN: Ferritin: 497 ng/mL — ABNORMAL HIGH (ref 24–336)

## 2019-08-20 LAB — APTT: aPTT: 33 seconds (ref 24–36)

## 2019-08-20 LAB — MAGNESIUM: Magnesium: 2 mg/dL (ref 1.7–2.4)

## 2019-08-20 LAB — SARS CORONAVIRUS 2 BY RT PCR (HOSPITAL ORDER, PERFORMED IN ~~LOC~~ HOSPITAL LAB): SARS Coronavirus 2: POSITIVE — AB

## 2019-08-20 MED ORDER — FE FUMARATE-B12-VIT C-FA-IFC PO CAPS
1.0000 | ORAL_CAPSULE | Freq: Two times a day (BID) | ORAL | Status: DC
  Administered 2019-08-20: 18:00:00 1 via ORAL
  Filled 2019-08-20 (×2): qty 1

## 2019-08-20 MED ORDER — HYDROCODONE-ACETAMINOPHEN 5-325 MG PO TABS
1.0000 | ORAL_TABLET | ORAL | Status: DC | PRN
  Administered 2019-08-20: 2 via ORAL
  Filled 2019-08-20: qty 2

## 2019-08-20 MED ORDER — SODIUM CHLORIDE 0.9 % IV SOLN
1.0000 g | Freq: Once | INTRAVENOUS | Status: AC
  Administered 2019-08-20: 1000 mg via INTRAVENOUS
  Filled 2019-08-20: qty 1

## 2019-08-20 MED ORDER — SODIUM CHLORIDE 0.9% FLUSH: 10.0000 mL | INTRAVENOUS | Status: DC | PRN

## 2019-08-20 MED ORDER — ONDANSETRON HCL 4 MG/2ML IJ SOLN: 4.0000 mg | Freq: Four times a day (QID) | INTRAMUSCULAR | Status: DC | PRN

## 2019-08-20 MED ORDER — SODIUM CHLORIDE 0.9 % IV SOLN
INTRAVENOUS | Status: DC
  Administered 2019-08-20: 06:00:00 via INTRAVENOUS

## 2019-08-20 MED ORDER — PIPERACILLIN-TAZOBACTAM 3.375 G IVPB
3.3750 g | Freq: Three times a day (TID) | INTRAVENOUS | Status: DC
  Administered 2019-08-20: 3.375 g via INTRAVENOUS
  Filled 2019-08-20: qty 50

## 2019-08-20 MED ORDER — ENSURE ENLIVE PO LIQD: 237.0000 mL | Freq: Two times a day (BID) | ORAL | 0 refills | Status: AC

## 2019-08-20 MED ORDER — ERTAPENEM IV (FOR PTA / DISCHARGE USE ONLY): 1.0000 g | INTRAVENOUS | 0 refills | Status: AC

## 2019-08-20 MED ORDER — PIPERACILLIN-TAZOBACTAM 3.375 G IVPB 30 MIN: 3.3750 g | Freq: Three times a day (TID) | INTRAVENOUS | Status: DC

## 2019-08-20 MED ORDER — SODIUM CHLORIDE 0.9 % IV SOLN
1.0000 g | INTRAVENOUS | Status: AC
  Administered 2019-08-20: 1 g via INTRAVENOUS
  Filled 2019-08-20: qty 1

## 2019-08-20 MED ORDER — ATORVASTATIN CALCIUM 40 MG PO TABS
40.0000 mg | ORAL_TABLET | Freq: Every day | ORAL | Status: DC
  Administered 2019-08-20: 40 mg via ORAL
  Filled 2019-08-20: qty 1

## 2019-08-20 MED ORDER — SODIUM CHLORIDE 0.9 % IV SOLN
1.0000 g | Freq: Three times a day (TID) | INTRAVENOUS | Status: DC
  Filled 2019-08-20: qty 1

## 2019-08-20 MED ORDER — POTASSIUM CHLORIDE CRYS ER 20 MEQ PO TBCR
40.0000 meq | EXTENDED_RELEASE_TABLET | Freq: Once | ORAL | Status: DC
  Filled 2019-08-20: qty 2

## 2019-08-20 MED ORDER — ONDANSETRON HCL 4 MG PO TABS: 4.0000 mg | ORAL_TABLET | Freq: Four times a day (QID) | ORAL | Status: DC | PRN

## 2019-08-20 MED ORDER — FE FUMARATE-B12-VIT C-FA-IFC PO CAPS: 1.0000 | ORAL_CAPSULE | Freq: Two times a day (BID) | ORAL | 0 refills | Status: AC

## 2019-08-20 MED ORDER — ACETAMINOPHEN 650 MG RE SUPP: 650.0000 mg | Freq: Four times a day (QID) | RECTAL | Status: DC | PRN

## 2019-08-20 MED ORDER — ACETAMINOPHEN 325 MG PO TABS: 650.0000 mg | ORAL_TABLET | Freq: Four times a day (QID) | ORAL | Status: DC | PRN

## 2019-08-20 MED ORDER — ENSURE ENLIVE PO LIQD: 237.0000 mL | Freq: Two times a day (BID) | ORAL | Status: DC

## 2019-08-20 MED ORDER — FINASTERIDE 5 MG PO TABS
5.0000 mg | ORAL_TABLET | Freq: Every day | ORAL | Status: DC
  Administered 2019-08-20: 5 mg via ORAL
  Filled 2019-08-20: qty 1

## 2019-08-20 NOTE — Discharge Summary (Signed)
[facility] Discharge Summary   Patient: Jim Tucker [id number]   PCP: Gregor Hams, MD DOB: 1941/04/05   Date of admission: 08/19/2019   Date of discharge:  08/20/2019    Discharge Diagnoses:  Principal diagnosis Renal hematoma UTI ESBL Active Problems:   Hyperlipidemia   BPH (benign prostatic hyperplasia)   HTN (hypertension)   History of 2019 novel coronavirus disease (COVID-19)   Protein-calorie malnutrition (HCC)   ESBL (extended spectrum beta-lactamase) producing bacteria infection   Kidney hematoma   Renal hematoma   Anemia   Admitted From: Home Disposition:  Home   Recommendations for Outpatient Follow-up:  1. PCP: Please ensure that the patient has a urology follow-up and duration of the antibiotic is verified by urology 2. Patient will require removal of the PICC line once antibiotics are completed. 3. Follow up LABS/TEST: Will require a repeat CT scan with urology to ensure resolution of the hematoma and duration of the antibiotic.  Follow-up Information    Gregor Hams, MD. Schedule an appointment as soon as possible for a visit in 2 week(s).   Specialties: Family Medicine, Emergency Medicine Contact information: [address] [place] [address] [phone]        Ardis Hughs, MD. Schedule an appointment as soon as possible for a visit in 2 weeks.   Specialty: Urology Contact information: [place] [address] [phone]        Care, [facility] Follow up.   Specialty: Home Health Services Contact information: [address] [place] [address] [phone]        [place] Follow up.          Diet recommendation: Carb modified diet  Activity: The patient is advised to gradually reintroduce usual activities,as tolerated .  Discharge Condition: good  Code Status: Full code   History of present illness: As per the H and P dictated on  admission, "Jim Tucker is a 78 y.o. male with medical history significant of COVID +, BPH, diverticulosis, hyperlipidemia,    Presented with intermittent right flank pain since yesterday with urinary urgency and frequency for 2 weeks.  No dysuria hematuria.  No recent fevers no chest pain or shortness of breath  Patient initially was tested COVID positive on 6th of  August at that time he required hospitalization and had oxygen requirement he initially did better but then developed recurrence of his symptoms with chills and fevers and was seen again in emergency department on 29 August he tested again positive for COVID at the time He has been consulted and told ER provider that test could be positive up to 40 to 50 days plan was to discharge patient back home as there was no evidence of new hypoxia Chest x-ray at that time showed persistent scattered groundglass opacities Was worrisome for UTI he was started on Keflex On urine culture showed ESBL which was resistant to Keflex Sensitive to gentamicin Zosyn and nitrofurantoin Emergency department attempted to contact patient 3 times but was unsuccessful patient dines any injury  Patient was seen by primary care provider with flank pain back he have not had any fever since but does not feel very well. Found to be tachycardic up to 117"  Hospital Course:  Summary of his active problems in the hospital is as following. Right subcapsular renal hematoma. Possibly spontaneous. No trauma or injury reported. No evidence of coagulopathy. Concern for pyelonephritis given patient's recent  UTI with ESBL treated with nitrofurantoin. Patient was brought into the ED because of severe flank pain. Currently patient is pain-free. Patient does not have any evidence of sepsis. Blood cultures so far negative. Discussed with ID.  Patient will be placed on ertapenem for total of 2 weeks with plan for follow-up with urology and repeat CT scan to ensure  resolution or improvement in the hematoma. Urology will decide on the duration of the antibiotics. Home health agency has been set up for the patient. Instead of a PICC line midline has been selected since the duration of antibiotic is not prolonged for now. Patient verbalized understanding about the treatment plan as well as understanding about the antibiotic management at home. I discussed with ID as well as urology to ensure follow-ups.  Recent COVID-19 infection. Patient's RT-PCR [place] COVID-19 is still positive. Patient's initial cover test was positive on July 17, 2019. Patient received complete hospitalization and treatment with IV Remdesivir and steroids during that.. Patient currently does not have any respiratory symptoms or GI symptoms. Primary presentation is secondary to flank pain. Chest x-ray actually shows improvement in patient's infiltrates. Currently I do not think the patient requires further isolation. We will discontinue isolation.  Iron deficiency anemia. Patient's hemoglobin was relatively low.  No active bleeding reported.  Likely nutritional deficiency. H&H remained stable. Subcapsular hematoma is not significant enough to cause anemia. We will provide oral iron supplementation for 1 month. Follow-up with PCP and will require a repeat iron check.  Increased nutrient needs in the setting of acute infection. We will provide focus of her mentation on discharge. 3 albumin low.  BPH. Continue home regimen.  Essential hypertension. Continue home regimen.  Patient was ambulatory without any assistance. On the day of the discharge the patient's vitals were stable, and no other acute medical condition were reported by patient. the patient was felt safe to be discharge at Home with Home health.  Consultants: Phone consultation with ID as well as urology Procedures: Midline placement  DISCHARGE MEDICATION: Allergies as of 08/20/2019      Reactions   Other  Other (See Comments)   Raw Apples swells throat      Medication List    STOP taking these medications   cephALEXin 500 MG capsule Commonly known as: KEFLEX   nitrofurantoin (macrocrystal-monohydrate) 100 MG capsule Commonly known as: MACROBID   predniSONE 20 MG tablet Commonly known as: DELTASONE     TAKE these medications   atorvastatin 80 MG tablet Commonly known as: LIPITOR Take 40 mg by mouth daily.   ertapenem  IVPB Commonly known as: INVANZ Inject 1 g into the vein daily for 12 days. Indication:  ESBL UTI Last Day of Therapy:  09/01/19 Labs - Once weekly:  CBC/D and BMP, Labs - Every other week:  ESR and CRP   feeding supplement (ENSURE ENLIVE) Liqd Take 237 mLs by mouth 2 (two) times daily between meals.   ferrous [id number] C-folic acid capsule Commonly known as: TRINSICON / FOLTRIN Take 1 capsule by mouth 2 (two) times daily after a meal.   finasteride 5 MG tablet Commonly known as: PROSCAR Take 1 tablet (5 mg total) by mouth daily.            Home Infusion Instuctions  (From admission, onward)         Start     Ordered   08/20/19 0000  Home infusion instructions [facility] May follow [place] Dosing Protocol; May administer Cathflo as needed  to maintain patency of vascular access device.; Flushing of vascular access device: per [facility] Protocol: 0.9% NaCl pre/post medica...    Question Answer Comment  Instructions May follow [place] Dosing Protocol   Instructions May administer Cathflo as needed to maintain patency of vascular access device.   Instructions Flushing of vascular access device: per [facility] Protocol: 0.9% NaCl pre/post medication administration and prn patency; Heparin 100 u/ml, 38m for implanted ports and Heparin 10u/ml, 570mfor all other central venous catheters.   Instructions May follow [facility] Anaphylaxis Protocol for First Dose Administration in the home: 0.9% NaCl at 25-50 ml/hr to maintain IV access for protocol  meds. Epinephrine 0.3 ml IV/IM PRN and Benadryl 25-50 IV/IM PRN s/s of anaphylaxis.   Instructions [facility] Infusion Coordinator (RN) to assist per patient IV care needs in the home PRN.      08/20/19 1328         Allergies  Allergen Reactions   Other Other (See Comments)     Raw Apples swells throat   Discharge Instructions    Home infusion instructions [facility] May follow [facility] Protocol; May administer Cathflo as needed to maintain patency of vascular access device.; Flushing of vascular access device: [facility]: 0.9% NaCl pre/post medica...   Complete by: As directed    Instructions: May follow [facility] Protocol   Instructions: May administer Cathflo as needed to maintain patency of vascular access device.   Instructions: Flushing of vascular access device: [facility]: 0.9% NaCl pre/post medication administration and prn patency; Heparin 100 u/ml, 39m51mor implanted ports and Heparin 10u/ml, 39ml739mr all other central venous catheters.   Instructions: May follow [facility] Anaphylaxis Protocol for First Dose Administration in the home: 0.9% NaCl at 25-50 ml/hr to maintain IV access for protocol meds. Epinephrine 0.3 ml IV/IM PRN and Benadryl 25-50 IV/IM PRN s/s of anaphylaxis.   Instructions: [facility] Coordinator (RN) to assist per patient IV care needs in the home PRN.     Discharge Exam: Filed Weights   08/20/19 0553  Weight: 55.1 kg   Vitals:   08/20/19 1021 08/20/19 1354  BP: (!) 111/59 120/74  Pulse: 70 80  Resp: 16 16  Temp: 98.7 F (37.1 C) 98.7 F (37.1 C)  SpO2: 96% 97%   General: Appear in mild distress, no Rash; Oral Mucosa Clear, moist. no Abnormal Mass Or lumps Cardiovascular: S1 and S2 Present, no Murmur, Respiratory: normal respiratory effort, Bilateral Air entry present and Clear to Auscultation, no Crackles, no wheezes Abdomen: Bowel Sound present, Soft and no tenderness, no  hernia Extremities: no Pedal edema, no calf tenderness Neurology: alert and oriented to time, place, and person affect appropriate.  The results of significant diagnostics from this hospitalization (including imaging, microbiology, ancillary and laboratory) are listed below for reference.    Significant Diagnostic Studies: Dg Chest Port 1 View  Result Date: 08/19/2019 CLINICAL DATA:  Covid positive EXAM: PORTABLE CHEST 1 VIEW COMPARISON:  August 07, 2019 FINDINGS: There is mild cardiomegaly. Streaky airspace opacity seen within the right mid lung. Mildly increased ground-glass opacity seen at the right lung base. This appears to be slightly improved since the prior exam however. No pleural effusion. Bilateral shoulder arthropathy. IMPRESSION: Slight interval improvement in bilateral airspace opacity. Electronically Signed   By: BindPrudencio Pair.   On: 08/19/2019 20:54   Dg Chest Portable 1 View  Result Date: 08/07/2019 CLINICAL DATA:  Fever EXAM: PORTABLE  CHEST 1 VIEW COMPARISON:  07/15/2019 FINDINGS: Again noted are ground-glass airspace opacities bilaterally these appear to be most evident in the left upper lobe. There is no pneumothorax. No large pleural effusion. The heart size is stable from prior study. Aortic calcifications are noted. There is no acute osseous abnormality. IMPRESSION: Persistent scattered ground-glass airspace opacities bilaterally which is nonspecific but can be seen with an atypical infectious process such as viral pneumonia. Overall, this has improved from prior chest x-ray. Electronically Signed   By: Constance Holster M.D.   On: 08/07/2019 22:08   Ct Renal Stone Study  Result Date: 08/19/2019 CLINICAL DATA:  Right flank pain, recent COVID-19 diagnosis EXAM: CT ABDOMEN AND PELVIS WITHOUT CONTRAST TECHNIQUE: Multidetector CT imaging of the abdomen and pelvis was performed following the standard protocol without IV contrast. COMPARISON:  CT 08/10/2013 FINDINGS: Lower  chest: Bibasilar areas of atelectasis. Stable calcified granuloma in the left lower lobe. Patchy areas of ground-glass opacity are present in both lung bases. There is a small right pleural effusion and adjacent passive atelectasis. Hepatobiliary: No focal liver abnormality is seen. No gallstones, gallbladder wall thickening, or biliary dilatation. Pancreas: Unremarkable. No pancreatic ductal dilatation or surrounding inflammatory changes. Spleen: Normal in size without focal abnormality. Adrenals/Urinary Tract: Normal adrenal glands. There is a hyperattenuating (60 HU) right perinephric hematoma with retroperitoneal hemorrhage within the perirenal and pararenal spaces. Evaluation for an underlying lesion is limited in the absence of contrast media. No visible or contour deforming right renal lesions are seen fluid attenuation cyst measuring 2.3 cm arising from the lower pole left kidney. Additional fluid attenuation 2 cm left renal sinus cyst. No hydronephrosis. Mild bladder wall thickening is present. Stomach/Bowel: Distal esophagus, stomach and duodenal sweep are unremarkable. No bowel wall thickening or dilatation. No evidence of obstruction. Scattered colonic diverticula without focal pericolonic inflammation to suggest diverticulitis. A normal appendix is visualized. Vascular/Lymphatic: Atherosclerotic plaque within the normal caliber aorta. Hypoattenuation of the cardiac blood pool relative to the myocardium is suggestive of anemia. No suspicious or enlarged lymph nodes in the included lymphatic chains. Reproductive: [place] Lint defect the level of the prostate likely related to prior TURP. Other: Retroperitoneal hemorrhage, as above. No free intraperitoneal fluid. No free gas. Musculoskeletal: Multilevel degenerative changes of the spine. L4-5 posterior spinal fusion. Stepwise retrolisthesis L1-L3, similar to prior. No acute osseous abnormality or suspicious osseous lesion. IMPRESSION: 1. Moderate right  subcapsular renal hematoma with additional hemorrhage and stranding present in the perirenal and pararenal retroperitoneal spaces. Possibly spontaneous in the absence of visible underlying lesion though evaluation is somewhat limited in the absence of contrast. 2. Small right pleural effusion with adjacent passive atelectasis. 3. Patchy areas of ground-glass opacity in both lung bases, which may reflect atelectasis or infection. 4. Stable calcified granuloma in the left lung base 5. Diverticulosis without evidence of diverticulitis. 6. Prior L4-5 posterior spinal fusion. 7. Aortic Atherosclerosis (ICD10-I70.0). These results were called by telephone at the time of interpretation on 08/19/2019 at 10:08 pm to provider [facility] , who verbally acknowledged these results. Electronically Signed   By: Lovena Le M.D.   On: 08/19/2019 22:08   [place] Ekg Site Rite  Result Date: 08/20/2019 If Site Rite image not attached, placement could not be confirmed due to current cardiac rhythm.   Microbiology: Recent Results (from the past 240 hour(s))  SARS Coronavirus 2 [facility] order, Performed in [facility] hospital lab) Nasopharyngeal Nasopharyngeal Swab     Status: Abnormal   Collection Time: 08/19/19  8:31 PM   Specimen: Nasopharyngeal Swab  Result Value Ref Range Status   SARS Coronavirus 2 POSITIVE (A) NEGATIVE Final    Comment: CRITICAL RESULT CALLED TO, READ BACK BY AND VERIFIED WITH: RN J FRICKEY AT 0005 08/20/19 CRUICKSHANK A (NOTE) If result is NEGATIVE SARS-CoV-2 target nucleic acids are NOT DETECTED. The SARS-CoV-2 RNA is generally detectable in upper and lower  respiratory specimens during the acute phase of infection. The lowest  concentration of SARS-CoV-2 viral copies this assay can detect is 250  copies / mL. A negative result does not preclude SARS-CoV-2 infection  and should not be used as the sole basis for treatment or other  patient management decisions.  A negative result may occur  with  improper specimen collection / handling, submission of specimen other  than nasopharyngeal swab, presence of viral mutation(s) within the  areas targeted by this assay, and inadequate number of viral copies  (<250 copies / mL). A negative result must be combined with clinical  observations, patient history, and epidemiological information. If result is POSITIVE SARS-CoV-2 target nucleic acids  are DETECTED. The SARS-CoV-2 RNA is generally detectable in upper and lower  respiratory specimens during the acute phase of infection.  Positive  results are indicative of active infection with SARS-CoV-2.  Clinical  correlation with patient history and other diagnostic information is  necessary to determine patient infection status.  Positive results do  not rule out bacterial infection or co-infection with other viruses. If result is PRESUMPTIVE POSTIVE SARS-CoV-2 nucleic acids MAY BE PRESENT.   A presumptive positive result was obtained on the submitted specimen  and confirmed on repeat testing.  While 2019 novel coronavirus  (SARS-CoV-2) nucleic acids may be present in the submitted sample  additional confirmatory testing may be necessary for epidemiological  and / or clinical management purposes  to differentiate between  SARS-CoV-2 and other Sarbecovirus currently known to infect humans.  If clinically indicated additional testing with an alternate test  methodol ogy [phone]) is advised. The SARS-CoV-2 RNA is generally  detectable in upper and lower respiratory specimens during the acute  phase of infection. The expected result is Negative. Fact Sheet for Patients:  StrictlyIdeas.no Fact Sheet for Healthcare Providers: BankingDealers.co.za This test is not yet approved or cleared by the [place] FDA and has been authorized for detection and/or diagnosis of SARS-CoV-2 by FDA under an Emergency Use Authorization (EUA).  This EUA  will remain in effect (meaning this test can be used) for the duration of the COVID-19 declaration under Section 564(b)(1) of the Act, 21 U.S.C. section 360bbb-3(b)(1), unless the authorization is terminated or revoked sooner. Performed at [facility], [place] [address], [place], [place] [address]   Blood Culture (routine x 2)     Status: None (Preliminary result)   Collection Time: 08/19/19  8:31 PM   Specimen: BLOOD  Result Value Ref Range Status   Specimen Description   Final    BLOOD BLOOD RIGHT FOREARM Performed at [facility] [address] [place], [place] [address]    Special Requests   Final    BOTTLES DRAWN AEROBIC AND ANAEROBIC Blood Culture adequate volume Performed at [facility] [address], [place], [place] [address]    Culture   Final    NO GROWTH < 12 HOURS Performed at [facility] [address] [place], [place] [address]    Report Status PENDING  Incomplete  Blood Culture (routine x 2)  Status: None (Preliminary result)   Collection Time: 08/19/19  8:36 PM   Specimen: BLOOD  Result Value Ref Range Status   Specimen Description   Final    BLOOD RIGHT ANTECUBITAL Performed at [facility] [address] [place], [place] [address]    Special Requests   Final    BOTTLES DRAWN AEROBIC AND ANAEROBIC Blood Culture adequate volume Performed at [facility] [address] [place], [place] [address]    Culture   Final    NO GROWTH < 12 HOURS Performed at [facility] [address], [place], [place] [address]    Report Status PENDING  Incomplete     Labs: CBC: Recent Labs  Lab 08/19/19 1336 08/19/19 2031 08/20/19 0653  WBC 10.0 9.4 7.1  NEUTROABS 8,140* 7.5  --   HGB 12.6* 10.3* 9.0*  HCT 36.9* 31.4* 27.6*  MCV 91.1 93.2 93.9  PLT 179 160 295   Basic Metabolic Panel: Recent Labs  Lab 08/19/19 1336 08/19/19 2031  08/20/19 0653  NA 134* 135 137  K 3.9 3.5 3.1*  CL 100 104 102  CO2 '23 22 24  '$ GLUCOSE 150* 193* 136*  BUN 27* 28* 28*  CREATININE 0.62* 0.63 0.54*  CALCIUM 8.6 8.3* 8.2*  MG  --   --  2.0  PHOS  --   --  2.6   Liver Function Tests: Recent Labs  Lab 08/19/19 1336 08/19/19 2031 08/20/19 0653  AST 25 17 14*  ALT 36 33 27  ALKPHOS  --  96 86  BILITOT 0.8 0.6 0.8  PROT 6.1 5.6* 5.1*  ALBUMIN  --  2.5* 2.2*   No results for input(s): LIPASE, AMYLASE in the last 168 hours. No results for input(s): AMMONIA in the last 168 hours. Cardiac Enzymes: No results for input(s): CKTOTAL, CKMB, CKMBINDEX, TROPONINI in the last 168 hours. BNP (last 3 results) No results for input(s): BNP in the last 8760 hours. CBG: No results for input(s): GLUCAP in the last 168 hours.  Time spent: 35 minutes  Signed:  Berle Mull  [facility]  08/20/2019 6:06 PM

## 2019-08-20 NOTE — TOC Progression Note (Signed)
Transition of Care [facility]) - Progression Note    Patient Details  Name: Jim Tucker MRN: [id number] Date of Birth: 07/06/1941  [facility] CM/SW Contact  Purcell Mouton, RN Phone Number: 08/20/2019, 3:02 PM  Clinical Narrative:    Spoke with pt concerning cost of IV medications, Observation and Code 44/telephone. Also spoke with his wife via telephone with the same information. Pt has insurance but no part D and states he is able to pay for medications at $131.00/day. Code 44 explained to pt. Pt is COVID positive and will give notice to pt's RN to pass on to the pt.    Expected Discharge Plan: [place]    Expected Discharge Plan and Services Expected Discharge Plan: [place]   Discharge Planning Services: CM Consult Post Acute Care Choice: [place] arrangements for the past 2 months: [place]: RN [facility] Agency: [facility] Date [place]: 08/20/19 Time [place]: 1139 Representative spoke with at [place] and [employer]   Social Determinants of Health ([place]) Interventions    Readmission Risk Interventions No flowsheet data found.

## 2019-08-20 NOTE — Progress Notes (Signed)
Initial Nutrition Assessment  INTERVENTION:   -Ensure Enlive po BID, each supplement provides 350 kcal and 20 grams of protein  NUTRITION DIAGNOSIS:   Increased nutrient needs related to acute illness as evidenced by estimated needs.  GOAL:   Patient will meet greater than or equal to 90% of their needs  MONITOR:   PO intake, Supplement acceptance, Labs, Weight trends, I & O's  REASON FOR ASSESSMENT:   Consult Assessment of nutrition requirement/status  ASSESSMENT:   78 y.o. male with medical history significant of COVID +, BPH, diverticulosis, hyperlipidemia, Admitted for ESBL UTI and spontaneous Right subcapsular renal hematoma Patient is COVID-19 positive.  **RD working remotely**  Per chart review, pt has tested positive for COVID-19 since 8/6. Pt is currently consuming 100% of meals. Given increased needs from acute illness, will order Ensure supplements for additional kcal and protein.  Per weight records, pt has lost 15 lbs since September 2019 (11% wt loss x 1 year, insignificant for time frame).  Medications: Ferrous [id number] C-folic acid capsule, K-DUR tablet once Labs reviewed: Low K Mg/Phos WNL  NUTRITION - FOCUSED PHYSICAL EXAM:  Unable to perform -working remotely.  Diet Order:   Diet Order            Diet Heart Room service appropriate? Yes; Fluid consistency: Thin  Diet effective now              EDUCATION NEEDS:   No education needs have been identified at this time  Skin:  Skin Assessment: Reviewed RN Assessment  Last BM:  PTA  Height:   Ht Readings from Last 1 Encounters:  08/20/19 5\' 1"  (1.549 m)    Weight:   Wt Readings from Last 1 Encounters:  08/20/19 55.1 kg    Ideal Body Weight:  50.9 kg  BMI:  Body mass index is 22.95 kg/m.  Estimated Nutritional Needs:   Kcal:  1650-1850  Protein:  75-85g  Fluid:  1.8L/day  Clayton Bibles, MS, RD, LDN Inpatient Clinical Dietitian Pager: [phone] After Hours  Pager: [phone]

## 2019-08-20 NOTE — Progress Notes (Signed)
Pharmacy Antibiotic Note  Jim Tucker is a 78 y.o. male admitted on 08/19/2019 with ESBL + UTI.  Pharmacy has been consulted for meropenem dosing.  Patient has PMH significant for COVID-19 infection initially diagnosed on 07/15/19 and requiring hospitalization. Pt seen in ED on 08/07/19 and urine culture obtained at that time growing 90K E.coli, ESBL +. Patient now admitted for IV antibiotics for ESBL infection.  Today, 08/20/19  WBC 7.1  SCr 0.5, CrCl ~56 mL/min  Afebrile  Plan:  Discontinued piperacillin/tazobactam  Initiate meropenem 1 g IV q8h  Follow renal function  Pharmacy consulted to change IV antibiotics to ertapenem on discharge and complete OPAT with antibiotic duration through 09/01/19.   Height: 5\' 1"  (154.9 cm) Weight: 121 lb 7.6 oz (55.1 kg) IBW/kg (Calculated) : 52.3  Temp (24hrs), Avg:98.6 F (37 C), Min:98.2 F (36.8 C), Max:98.9 F (37.2 C)  Recent Labs  Lab 08/19/19 1336 08/19/19 2031 08/20/19 0653  WBC 10.0 9.4 7.1  CREATININE 0.62* 0.63 0.54*  LATICACIDVEN  --  1.5  --     Estimated Creatinine Clearance: 56.3 mL/min (A) (by C-G formula based on SCr of 0.54 mg/dL (L)).    Allergies  Allergen Reactions  . Other Other (See Comments)     Raw Apples swells throat    Antimicrobials this admission: Piperacillin/tazobactam 9/10 >> 9/11 meropenem 9/11 >>   Dose adjustments this admission:  Microbiology results: 9/10 BCx: Sent 9/10 SARS-2: Positive, initially diagnosed on 07/15/19  Previous cultures: 8/29 UCx: 90K E.coli, ESBL+. S carbapenem, nitrofurantoin, pip/tazo, and gentamicin  Thank you for allowing pharmacy to be a part of this patient's care.  Lenis Noon, PharmD 08/20/2019 11:08 AM

## 2019-08-20 NOTE — Care Management CC44 (Signed)
Condition Code 44 Documentation Completed  Patient Details  Name: Jim Tucker MRN: [id number] Date of Birth: 08/05/1941   Condition Code 44 given:  Yes Patient signature on Condition Code 44 notice:  Yes Documentation of 2 MD's agreement:  Yes Code 44 added to claim:  Yes    Purcell Mouton, RN 08/20/2019, 2:57 PM

## 2019-08-20 NOTE — Discharge Instructions (Signed)
Midline Catheter A midline catheter is a thin, flexible tube that is inserted into a vein in the upper arm or at the bend in the elbow. Its tip ends at or near the armpit (axillary) area. A midline catheter is a type of IV access. What are the risks? Generally, midline catheters are safe to use. However, problems may occur, including:  Clots. A clot can form in the midline catheter or at its tip.  Phlebitis. This is when the vein becomes warm, swollen, and tender. A red streak may develop along the vein where the midline catheter is inserted.  Leakage (infiltration) of IV fluids or medicine into the surrounding tissue of the vein. This can cause swelling, pain, and tissue damage in the arm with the midline catheter.  Infection.  Nerve or tendon injury or irritation during midline catheter insertion. How are midline catheters used? A midline catheter is used to:  Give IV fluids and nutrients.  Give medicines.  Draw blood.  Give blood back to the body, such as during a blood transfusion or hemodialysis.  Inject a contrast dye for a CT scan (power injection).  Provide IV access for treatment that lasts 1-4 weeks. Follow these instructions at home: Follow instructions from your health care provider about how to take care of your midline catheter at home. To make sure that your catheter works well:  Landscape architect with soap and water before and after caring for or using for your midline catheter. If soap and water are not available, use hand sanitizer.  Before connecting a syringe or tubing to your midline catheter, scrub the tip of your catheter with a new alcohol wipe for 10-14 seconds. Allow the catheter tip to dry completely. Do this every time before you connect the syringe or tubing to your midline.  Do not let the midline catheter bandage (dressing) get wet. Cover it with a watertight covering when you take a bath or a shower. Change the dressing right away if it becomes  wet.  Do not take baths, swim, or use a hot tub until your health care provider approves.  Do not pull on the midline catheter or tubing. Doing that can move the midline catheter out of its place in the vein. If the midline catheter is pulled out of place, the IV fluids or medicine you are getting can leak into the surrounding tissue.  Do not allow blood pressure monitoring or needle punctures on the side where the midline catheter is located.  Do not lift anything that is heavier than 10 lb (4.5 kg) or the limit given by your health care provider. Check your insertion site every day for signs of infection. Check for:  Redness, swelling, or pain.  Fluid or blood.  Warmth.  Pus or a bad smell. Contact a health care provider if:  The dressing is loose and the midline catheter insertion site is exposed.  The skin is irritated where the dressing has been applied. Get help right away if:  You have chills or fever.  There is bleeding at the site where the midline catheter enters your arm.  There is drainage, redness, swelling, discomfort, or warmth in the arm with the midline catheter.  You are unable to flush your midline catheter or it feels blocked.  The midline catheter is partially or completely pulled out.  Your catheter is broken or leaking.  You are dizzy.  You have shortness of breath.  You have an irregular heartbeat. Summary  A midline  catheter is a thin, flexible tube that is inserted into a vein in the upper arm or at the bend in the elbow.  A midline catheter is used to give IV fluids or nutrients, give medicines, draw blood, give blood back to the body, inject a dye for a CT scan (power injection), or provide IV access for treatment that lasts 1-4 weeks.  Check your insertion site every day for signs of infection. Signs include redness, swelling, pain, fluid, blood, warmth, pus, or a bad smell. This information is not intended to replace advice given to you  by your health care provider. Make sure you discuss any questions you have with your health care provider. Document Released: 04/29/2011 Document Revised: 01/18/2019 Document Reviewed: 12/27/2016 Elsevier Patient Education  2020 [employer].

## 2019-08-20 NOTE — Care Management Obs Status (Signed)
[place] NOTIFICATION   Patient Details  Name: Nachman Guttery MRN: [id number] Date of Birth: [birth year]   Medicare Observation Status Notification Given:  Yes    Purcell Mouton, RN 08/20/2019, 2:58 PM

## 2019-08-20 NOTE — TOC Initial Note (Signed)
Transition of Care [facility]) - Initial/Assessment Note    Patient Details  Name: Jim Tucker MRN: [id number] Date of Birth: 12-Jun-1941  Transition of Care [facility]) CM/SW Contact:    Purcell Mouton, RN Phone Number: 08/20/2019, 11:40 AM  Clinical Narrative:                 Pt will discharge home with [facility] and [facility].  Expected Discharge Plan: [place]     Patient Goals and CMS Choice Patient states their goals for this hospitalization and ongoing recovery are:: To get better, go home.   Choice offered to / list presented to : Patient  Expected Discharge Plan and Services Expected Discharge Plan: [place]   Discharge Planning Services: CM Consult Post Acute Care Choice: [place] arrangements for the past 2 months: Single Family Home                           HH Arranged: RN [place] Agency: [place] [facility](and [facility]) Date [place]: 08/20/19 Time [place]: 1139 Representative spoke with at [place]: Tommi Rumps and Jeannene Patella  Prior Living Arrangements/Services Living arrangements for the past 2 months: [place] with:: Self Patient language and need for interpreter reviewed:: No Do you feel safe going back to the place where you live?: Yes          Current home services: Home RN    Activities of Daily Living Home Assistive Devices/Equipment: None ADL Screening (condition at time of admission) Patient's cognitive ability adequate to safely complete daily activities?: Yes Is the patient deaf or have difficulty hearing?: No Does the patient have difficulty seeing, even when wearing glasses/contacts?: No Does the patient have difficulty concentrating, remembering, or making decisions?: No Patient able to express need for assistance with ADLs?: Yes Does the patient have difficulty dressing or bathing?: No Independently performs ADLs?: Yes (appropriate for  developmental age) Does the patient have difficulty walking or climbing stairs?: No Weakness of Legs: None Weakness of Arms/Hands: None  Permission Sought/Granted Permission sought to share information with : Case Manager                Emotional Assessment     Affect (typically observed): Accepting Orientation: : Oriented to Self, Oriented to Place, Oriented to  Time, Oriented to Situation      Admission diagnosis:  ESBL (extended spectrum beta-lactamase) producing bacteria infection [A49.9, Z16.12] Hematoma of kidney without rupture of capsule, right, initial encounter [S37.011A] Renal hematoma [S37.019A] Patient Active Problem List   Diagnosis Date Noted  . Renal hematoma 08/20/2019  . Anemia 08/20/2019  . ESBL (extended spectrum beta-lactamase) producing bacteria infection 08/19/2019  . Kidney hematoma 08/19/2019  . History of 2019 novel coronavirus disease (COVID-19) 08/05/2019  . Protein-calorie malnutrition ([place]) 08/05/2019  . Respiratory tract infection due to COVID-19 virus 07/15/2019  . Internal hemorrhoid 10/15/2018  . Onychomycosis 08/12/2018  . Prediabetes 05/12/2018  . HTN (hypertension) 02/25/2017  . HNP (herniated nucleus pulposus), lumbar 08/08/2014  . COLONIC POLYPS 03/28/2008  . DIVERTICULOSIS OF COLON 03/28/2008  . BPH (benign prostatic hyperplasia) 09/10/2007  . PTERYGIUM NOS 03/02/2007  . DYSPEPSIA 12/03/2006  . SEBORRHEIC KERATOSIS 11/18/2006  . Hyperlipidemia 11/13/2006  . DYSRHYTHMIA, CARDIAC NOS 11/13/2006   PCP:  Gregor Hams, MD Pharmacy:   [place] [place] [address] [place]  [address] [place] [address] Phone: [phone] Fax: [facility] [address], [place] [address] [place] [address] Phone: [phone] Fax: [phone]  [facility] [address] [place]  [address] Phone: [phone] Fax: [phone]     Social Determinants of Health (SDOH) Interventions    Readmission Risk Interventions No flowsheet data found.

## 2019-08-20 NOTE — Progress Notes (Signed)
PHARMACY CONSULT NOTE FOR:  OUTPATIENT  PARENTERAL ANTIBIOTIC THERAPY (OPAT)  Indication: ESBL + E.coli UTI Regimen: Ertapenem 1 g IV daily End date: 09/01/19  IV antibiotic discharge orders are pended. To discharging provider:  please sign these orders via discharge navigator,  Select New Orders & click on the button choice - Manage This Unsigned Work.     Thank you for allowing pharmacy to be a part of this patient's care.  Lenis Noon, PharmD 08/20/2019, 12:03 PM

## 2019-08-21 DIAGNOSIS — I119 Hypertensive heart disease without heart failure: Secondary | ICD-10-CM | POA: Diagnosis not present

## 2019-08-21 DIAGNOSIS — Z452 Encounter for adjustment and management of vascular access device: Secondary | ICD-10-CM | POA: Diagnosis not present

## 2019-08-21 DIAGNOSIS — M19012 Primary osteoarthritis, left shoulder: Secondary | ICD-10-CM | POA: Diagnosis not present

## 2019-08-21 DIAGNOSIS — J984 Other disorders of lung: Secondary | ICD-10-CM | POA: Diagnosis not present

## 2019-08-21 DIAGNOSIS — D509 Iron deficiency anemia, unspecified: Secondary | ICD-10-CM | POA: Diagnosis not present

## 2019-08-21 DIAGNOSIS — J90 Pleural effusion, not elsewhere classified: Secondary | ICD-10-CM | POA: Diagnosis not present

## 2019-08-21 DIAGNOSIS — M4316 Spondylolisthesis, lumbar region: Secondary | ICD-10-CM | POA: Diagnosis not present

## 2019-08-21 DIAGNOSIS — N12 Tubulo-interstitial nephritis, not specified as acute or chronic: Secondary | ICD-10-CM | POA: Diagnosis not present

## 2019-08-21 DIAGNOSIS — B9629 Other Escherichia coli [E. coli] as the cause of diseases classified elsewhere: Secondary | ICD-10-CM | POA: Diagnosis not present

## 2019-08-21 DIAGNOSIS — K5730 Diverticulosis of large intestine without perforation or abscess without bleeding: Secondary | ICD-10-CM | POA: Diagnosis not present

## 2019-08-21 DIAGNOSIS — S37011D Minor contusion of right kidney, subsequent encounter: Secondary | ICD-10-CM | POA: Diagnosis not present

## 2019-08-21 DIAGNOSIS — R351 Nocturia: Secondary | ICD-10-CM | POA: Diagnosis not present

## 2019-08-21 DIAGNOSIS — U071 COVID-19: Secondary | ICD-10-CM | POA: Diagnosis not present

## 2019-08-21 DIAGNOSIS — N401 Enlarged prostate with lower urinary tract symptoms: Secondary | ICD-10-CM | POA: Diagnosis not present

## 2019-08-21 DIAGNOSIS — I700 Atherosclerosis of aorta: Secondary | ICD-10-CM | POA: Diagnosis not present

## 2019-08-21 DIAGNOSIS — M47816 Spondylosis without myelopathy or radiculopathy, lumbar region: Secondary | ICD-10-CM | POA: Diagnosis not present

## 2019-08-21 DIAGNOSIS — K689 Other disorders of retroperitoneum: Secondary | ICD-10-CM | POA: Diagnosis not present

## 2019-08-21 DIAGNOSIS — J988 Other specified respiratory disorders: Secondary | ICD-10-CM | POA: Diagnosis not present

## 2019-08-21 DIAGNOSIS — R3915 Urgency of urination: Secondary | ICD-10-CM | POA: Diagnosis not present

## 2019-08-21 DIAGNOSIS — N281 Cyst of kidney, acquired: Secondary | ICD-10-CM | POA: Diagnosis not present

## 2019-08-21 DIAGNOSIS — N3289 Other specified disorders of bladder: Secondary | ICD-10-CM | POA: Diagnosis not present

## 2019-08-21 DIAGNOSIS — J9811 Atelectasis: Secondary | ICD-10-CM | POA: Diagnosis not present

## 2019-08-21 DIAGNOSIS — I499 Cardiac arrhythmia, unspecified: Secondary | ICD-10-CM | POA: Diagnosis not present

## 2019-08-21 DIAGNOSIS — M19011 Primary osteoarthritis, right shoulder: Secondary | ICD-10-CM | POA: Diagnosis not present

## 2019-08-21 DIAGNOSIS — H11009 Unspecified pterygium of unspecified eye: Secondary | ICD-10-CM | POA: Diagnosis not present

## 2019-08-21 LAB — URINE CULTURE
MICRO NUMBER:: 867369
SPECIMEN QUALITY:: ADEQUATE

## 2019-08-22 DIAGNOSIS — J988 Other specified respiratory disorders: Secondary | ICD-10-CM | POA: Diagnosis not present

## 2019-08-22 DIAGNOSIS — S37011D Minor contusion of right kidney, subsequent encounter: Secondary | ICD-10-CM | POA: Diagnosis not present

## 2019-08-22 DIAGNOSIS — B9629 Other Escherichia coli [E. coli] as the cause of diseases classified elsewhere: Secondary | ICD-10-CM | POA: Diagnosis not present

## 2019-08-22 DIAGNOSIS — Z452 Encounter for adjustment and management of vascular access device: Secondary | ICD-10-CM | POA: Diagnosis not present

## 2019-08-22 DIAGNOSIS — U071 COVID-19: Secondary | ICD-10-CM | POA: Diagnosis not present

## 2019-08-22 DIAGNOSIS — N12 Tubulo-interstitial nephritis, not specified as acute or chronic: Secondary | ICD-10-CM | POA: Diagnosis not present

## 2019-08-23 DIAGNOSIS — Z452 Encounter for adjustment and management of vascular access device: Secondary | ICD-10-CM | POA: Diagnosis not present

## 2019-08-23 DIAGNOSIS — K689 Other disorders of retroperitoneum: Secondary | ICD-10-CM | POA: Diagnosis not present

## 2019-08-23 DIAGNOSIS — R3915 Urgency of urination: Secondary | ICD-10-CM | POA: Diagnosis not present

## 2019-08-23 DIAGNOSIS — S37011D Minor contusion of right kidney, subsequent encounter: Secondary | ICD-10-CM | POA: Diagnosis not present

## 2019-08-23 DIAGNOSIS — U071 COVID-19: Secondary | ICD-10-CM | POA: Diagnosis not present

## 2019-08-23 DIAGNOSIS — I119 Hypertensive heart disease without heart failure: Secondary | ICD-10-CM | POA: Diagnosis not present

## 2019-08-23 DIAGNOSIS — B9629 Other Escherichia coli [E. coli] as the cause of diseases classified elsewhere: Secondary | ICD-10-CM | POA: Diagnosis not present

## 2019-08-23 DIAGNOSIS — R351 Nocturia: Secondary | ICD-10-CM | POA: Diagnosis not present

## 2019-08-23 DIAGNOSIS — D509 Iron deficiency anemia, unspecified: Secondary | ICD-10-CM | POA: Diagnosis not present

## 2019-08-23 DIAGNOSIS — J9811 Atelectasis: Secondary | ICD-10-CM | POA: Diagnosis not present

## 2019-08-23 DIAGNOSIS — N12 Tubulo-interstitial nephritis, not specified as acute or chronic: Secondary | ICD-10-CM | POA: Diagnosis not present

## 2019-08-23 DIAGNOSIS — J988 Other specified respiratory disorders: Secondary | ICD-10-CM | POA: Diagnosis not present

## 2019-08-23 DIAGNOSIS — N401 Enlarged prostate with lower urinary tract symptoms: Secondary | ICD-10-CM | POA: Diagnosis not present

## 2019-08-24 LAB — CULTURE, BLOOD (ROUTINE X 2)
Culture: NO GROWTH
Culture: NO GROWTH
Special Requests: ADEQUATE
Special Requests: ADEQUATE

## 2019-08-26 DIAGNOSIS — N200 Calculus of kidney: Secondary | ICD-10-CM | POA: Diagnosis not present

## 2019-08-26 DIAGNOSIS — N151 Renal and perinephric abscess: Secondary | ICD-10-CM | POA: Diagnosis not present

## 2019-08-27 LAB — COMPLETE METABOLIC PANEL WITH GFR
AG Ratio: 1.3 (calc) (ref 1.0–2.5)
ALT: 36 U/L (ref 9–46)
AST: 25 U/L (ref 10–35)
Albumin: 3.5 g/dL — ABNORMAL LOW (ref 3.6–5.1)
Alkaline phosphatase (APISO): 126 U/L (ref 35–144)
BUN/Creatinine Ratio: 44 (calc) — ABNORMAL HIGH (ref 6–22)
BUN: 27 mg/dL — ABNORMAL HIGH (ref 7–25)
CO2: 23 mmol/L (ref 20–32)
Calcium: 8.6 mg/dL (ref 8.6–10.3)
Chloride: 100 mmol/L (ref 98–110)
Creat: 0.62 mg/dL — ABNORMAL LOW (ref 0.70–1.18)
GFR, Est African American: 110 mL/min/{1.73_m2} (ref >=60)
GFR, Est Non African American: 95 mL/min/{1.73_m2} (ref >=60)
Globulin: 2.6 g/dL (calc) (ref 1.9–3.7)
Glucose, Bld: 150 mg/dL — ABNORMAL HIGH (ref 65–99)
Potassium: 3.9 mmol/L (ref 3.5–5.3)
Sodium: 134 mmol/L — ABNORMAL LOW (ref 135–146)
Total Bilirubin: 0.8 mg/dL (ref 0.2–1.2)
Total Protein: 6.1 g/dL (ref 6.1–8.1)

## 2019-08-27 LAB — CBC WITH DIFFERENTIAL/PLATELET
Absolute Monocytes: 700 cells/uL (ref 200–950)
Basophils Absolute: 30 cells/uL (ref 0–200)
Basophils Relative: 0.3 %
Eosinophils Absolute: 10 cells/uL — ABNORMAL LOW (ref 15–500)
Eosinophils Relative: 0.1 %
HCT: 36.9 % — ABNORMAL LOW (ref 38.5–50.0)
Hemoglobin: 12.6 g/dL — ABNORMAL LOW (ref 13.2–17.1)
Lymphs Abs: 1120 cells/uL (ref 850–3900)
MCH: 31.1 pg (ref 27.0–33.0)
MCHC: 34.1 g/dL (ref 32.0–36.0)
MCV: 91.1 fL (ref 80.0–100.0)
MPV: 10.9 fL (ref 7.5–12.5)
Monocytes Relative: 7 %
Neutro Abs: 8140 cells/uL — ABNORMAL HIGH (ref 1500–7800)
Neutrophils Relative %: 81.4 %
Platelets: 179 10*3/uL (ref 140–400)
RBC: 4.05 10*6/uL — ABNORMAL LOW (ref 4.20–5.80)
RDW: 14.2 % (ref 11.0–15.0)
Total Lymphocyte: 11.2 %
WBC: 10 10*3/uL (ref 3.8–10.8)

## 2019-08-27 LAB — CULTURE BLOOD MANUAL
Micro Number: 867306
Result: NO GROWTH

## 2019-08-27 LAB — SEDIMENTATION RATE: Sed Rate: 72 mm/h — ABNORMAL HIGH (ref 0–20)

## 2019-08-30 DIAGNOSIS — U071 COVID-19: Secondary | ICD-10-CM | POA: Diagnosis not present

## 2019-08-30 DIAGNOSIS — B9629 Other Escherichia coli [E. coli] as the cause of diseases classified elsewhere: Secondary | ICD-10-CM | POA: Diagnosis not present

## 2019-08-30 DIAGNOSIS — S37011D Minor contusion of right kidney, subsequent encounter: Secondary | ICD-10-CM | POA: Diagnosis not present

## 2019-08-30 DIAGNOSIS — N12 Tubulo-interstitial nephritis, not specified as acute or chronic: Secondary | ICD-10-CM | POA: Diagnosis not present

## 2019-08-30 DIAGNOSIS — J988 Other specified respiratory disorders: Secondary | ICD-10-CM | POA: Diagnosis not present

## 2019-08-30 DIAGNOSIS — Z452 Encounter for adjustment and management of vascular access device: Secondary | ICD-10-CM | POA: Diagnosis not present

## 2019-08-31 DIAGNOSIS — N151 Renal and perinephric abscess: Secondary | ICD-10-CM | POA: Diagnosis not present

## 2019-09-01 ENCOUNTER — Ambulatory Visit (INDEPENDENT_AMBULATORY_CARE_PROVIDER_SITE_OTHER): Payer: Medicare Other | Admitting: Family Medicine

## 2019-09-01 VITALS — BP 128/79 | HR 89 | Temp 98.4°F | Wt 129.0 lb

## 2019-09-01 DIAGNOSIS — U071 COVID-19: Secondary | ICD-10-CM

## 2019-09-01 DIAGNOSIS — A499 Bacterial infection, unspecified: Secondary | ICD-10-CM

## 2019-09-01 DIAGNOSIS — D508 Other iron deficiency anemias: Secondary | ICD-10-CM | POA: Diagnosis not present

## 2019-09-01 DIAGNOSIS — Z1612 Extended spectrum beta lactamase (ESBL) resistance: Secondary | ICD-10-CM

## 2019-09-01 DIAGNOSIS — S37011A Minor contusion of right kidney, initial encounter: Secondary | ICD-10-CM

## 2019-09-01 DIAGNOSIS — N390 Urinary tract infection, site not specified: Secondary | ICD-10-CM | POA: Diagnosis not present

## 2019-09-01 MED ORDER — FERROUS BISGLYCINATE CHELATE 28 MG PO CAPS: 1.0000 | ORAL_CAPSULE | Freq: Two times a day (BID) | ORAL | 1 refills | Status: DC

## 2019-09-01 NOTE — Progress Notes (Signed)
Jim Tucker is a 78 y.o. male who presents to [place]: Jim Tucker today for hospital follow-up and possible blood in stool.  Patient was last seen in my clinic on September 10.  At that time he was thought to have pyelonephritis with ESBL E. coli.  Unfortunately there were no good oral medication options.  He was advised to go to ED and was admitted on September 10 through September 11.  In ED he was found to have a right subcapsular renal hematoma thought to be spontaneous and pyelonephritis.  Plan to treat with ertapenem IV for 2 weeks in the outpatient setting.  Plan to follow-up with urology and repeat CT scan to ensure resolution or improvement of hematoma.  Using midline instead of PICC line for IV antibiotics at home with home health. He was seen by urology yesterday and plan to repeat CT scan in about 6 weeks.  He contacted the clinic recently and notes that he has been having dark stool and is worried he may have blood in the stool.  He was advised to return to clinic for evaluation.  He does have a history of recent iron deficiency anemia thought to be nutritional deficiency related.  He was given oral iron at discharge from hospital.  He notes he does not tolerate the prescription oral iron very well causing stomach upset.  He thinks the dark stool started when he started taking the iron pill.  Additionally he has a history of COVID-19.  He was hospitalized for this in early August.  He had retesting of COVID during a ED visit in late August and was positive.  He had retesting again when admitted to the hospital on September 10 when it was again positive for COVID-19.   Overall he is feeling pretty well.  He is feeling the best he has felt since he got COVID-19.  He thinks his energy is returning.  He denies significant urinary symptoms.   ROS as above:  Exam:  BP  128/79   Pulse 89   Temp 98.4 F (36.9 C) (Oral)   Wt 129 lb (58.5 kg)   BMI 24.37 kg/m  Wt Readings from Last 5 Encounters:  09/01/19 129 lb (58.5 kg)  08/20/19 121 lb 7.6 oz (55.1 kg)  08/19/19 122 lb (55.3 kg)  08/05/19 117 lb (53.1 kg)  07/15/19 120 lb (54.4 kg)    Gen: Well NAD HEENT: EOMI,  MMM Lungs: Normal work of breathing. CTABL Heart: RRR no MRG Abd: NABS, Soft. Nondistended, Nontender no CVA angle tenderness to percussion Exts: Brisk capillary refill, warm and well perfused.   Lab and Radiology Results Hospital notes and recent labs reviewed.   Assessment and Plan: 78 y.o. male with   Pyelonephritis with ESBL E. Coli: Receiving IV ertapenem via home health for a total 2-week course. 9/11-9/23.  Today should be the last day of IV antibiotics.  Will return for recheck as scheduled with me on September 28.  At that time we will check urine for test of cure.  If midline not removed by then we will go ahead and remove it in clinic.  Subcapsular renal hematoma: Due for repeat CT scan per urology.  Iron deficiency anemia: Thought to be nutritional.  Receiving oral iron.  Plan to recheck labs on follow-up visit on the 28th.  Dark stool: Probably due to oral iron supplementation.  If not improving with discontinuation of iron in the near  future will proceed with further testing.  Persistent COVID-19: Clinically doing pretty well.  Will go ahead and retest nasal swab for COVID PCR now.    PDMP not reviewed this encounter. Orders Placed This Encounter  Procedures  . Novel Coronavirus, NAA ([facility])    Order Specific Question:   Is this test for diagnosis or screening    Answer:   Diagnosis of ill patient    Order Specific Question:   Symptomatic for COVID-19 as defined by CDC    Answer:   Yes    Order Specific Question:   Date of Symptom Onset    Answer:   07/12/2019    Order Specific Question:   Hospitalized for COVID-19    Answer:   No    Order Specific Question:    Admitted to ICU for COVID-19    Answer:   No    Order Specific Question:   Previously tested for COVID-19    Answer:   Yes    Order Specific Question:   Resident in a congregate (group) care setting    Answer:   No    Order Specific Question:   Is the patient student?    Answer:   No    Order Specific Question:   Employed in healthcare setting    Answer:   No   Meds ordered this encounter  Medications  . Ferrous Bisglycinate Chelate 28 MG CAPS    Sig: Take 1 capsule by mouth 2 (two) times daily.    Dispense:  60 capsule    Refill:  1     Historical information moved to improve visibility of documentation.  Past Medical History:  Diagnosis Date  . Arthritis    shoulders, back  . BPH (benign prostatic hypertrophy)    takes Proscar daily  . Diverticulosis   . History of 2019 novel coronavirus disease (COVID-19) 08/05/2019   Required hospitalization discharged August 2020  . History of colon polyps   . Hyperlipidemia    takes Atorvastatin daily  . Internal hemorrhoid   . LBP (low back pain)    HNP  . Nocturia   . Numbness and tingling    in right leg   Past Surgical History:  Procedure Laterality Date  . BACK SURGERY    . COLONOSCOPY    . CYSTOSCOPY WITH BIOPSY N/A 07/19/2013   Procedure: TRANSURETHRAL RESECTION OF BLADDER NECK;  Surgeon: Bernestine Amass, MD;  Location: [facility];  Service: Urology;  Laterality: N/A;  . LUMBAR LAMINECTOMY Right 08/12/2014   Procedure: Right L2-3 Hemilaminectomy, Removal HNP;  Surgeon: Marybelle Killings, MD;  Location: [facility];  Service: Orthopedics;  Laterality: Right;  . LUMBAR SPINE SURGERY     x 2  . NASAL SINUS SURGERY  40 yrs ago  . PROSTATE BIOPSY  2007, 2010   benign.  Urology - Dr. Ellwood Sayers  . ROTATOR CUFF REPAIR Bilateral    total of 3  . TRANSURETHRAL RESECTION OF PROSTATE  10-23-2009   Social History   Tobacco Use  . Smoking status: Former Research scientist (life sciences)  . Smokeless tobacco: Never Used  . Tobacco comment: quit  smoking in Mar 2015  Substance Use Topics  . Alcohol use: Yes    Comment: wine occasionally   family history includes Stomach cancer in his brother.  Medications: Current Outpatient Medications  Medication Sig Dispense Refill  . atorvastatin (LIPITOR) 80 MG tablet Take 40 mg by mouth daily.     . ertapenem (  INVANZ) IVPB Inject 1 g into the vein daily for 12 days. Indication:  ESBL UTI Last Day of Therapy:  09/01/19 Labs - Once weekly:  CBC/D and BMP, Labs - Every other week:  ESR and CRP 12 Units 0  . feeding supplement, ENSURE ENLIVE, (ENSURE ENLIVE) LIQD Take 237 mLs by mouth 2 (two) times daily between meals. 5000 mL 0  . Ferrous Bisglycinate Chelate 28 MG CAPS Take 1 capsule by mouth 2 (two) times daily. 60 capsule 1  . ferrous [id number] C-folic acid (TRINSICON / FOLTRIN) capsule Take 1 capsule by mouth 2 (two) times daily after a meal. 60 capsule 0  . finasteride (PROSCAR) 5 MG tablet Take 1 tablet (5 mg total) by mouth daily. 90 tablet 3   No current facility-administered medications for this visit.    Allergies  Allergen Reactions  . Other Other (See Comments)     Raw Apples swells throat     Discussed warning signs or symptoms. Please see discharge instructions. Patient expresses understanding.

## 2019-09-01 NOTE — Patient Instructions (Addendum)
Thank you for coming in today. Take over the counter iron pill 2x daily if you can.  If you have trouble tolerating that I have send Iron Bisglycinate to take 2x daily.  I think the dark stools are due to iron pill.  If not better we can do the stool test.  We will test for COVID-19 today. Results should be back in a few days.   Recheck as scheduled on Sep 28th

## 2019-09-02 LAB — NOVEL CORONAVIRUS, NAA: SARS-CoV-2, NAA: NOT DETECTED

## 2019-09-02 LAB — SPECIMEN STATUS REPORT

## 2019-09-06 ENCOUNTER — Encounter: Payer: Self-pay | Admitting: Family Medicine

## 2019-09-06 ENCOUNTER — Other Ambulatory Visit: Payer: Self-pay

## 2019-09-06 ENCOUNTER — Ambulatory Visit (INDEPENDENT_AMBULATORY_CARE_PROVIDER_SITE_OTHER): Payer: Medicare Other | Admitting: Family Medicine

## 2019-09-06 VITALS — BP 135/79 | HR 91 | Temp 97.8°F | Wt 129.0 lb

## 2019-09-06 DIAGNOSIS — D508 Other iron deficiency anemias: Secondary | ICD-10-CM | POA: Diagnosis not present

## 2019-09-06 DIAGNOSIS — B9629 Other Escherichia coli [E. coli] as the cause of diseases classified elsewhere: Secondary | ICD-10-CM | POA: Diagnosis not present

## 2019-09-06 DIAGNOSIS — N390 Urinary tract infection, site not specified: Secondary | ICD-10-CM | POA: Diagnosis not present

## 2019-09-06 DIAGNOSIS — S37011A Minor contusion of right kidney, initial encounter: Secondary | ICD-10-CM | POA: Diagnosis not present

## 2019-09-06 DIAGNOSIS — N12 Tubulo-interstitial nephritis, not specified as acute or chronic: Secondary | ICD-10-CM | POA: Diagnosis not present

## 2019-09-06 DIAGNOSIS — A499 Bacterial infection, unspecified: Secondary | ICD-10-CM

## 2019-09-06 DIAGNOSIS — Z452 Encounter for adjustment and management of vascular access device: Secondary | ICD-10-CM | POA: Diagnosis not present

## 2019-09-06 DIAGNOSIS — Z1612 Extended spectrum beta lactamase (ESBL) resistance: Secondary | ICD-10-CM

## 2019-09-06 DIAGNOSIS — U071 COVID-19: Secondary | ICD-10-CM | POA: Diagnosis not present

## 2019-09-06 DIAGNOSIS — S37011D Minor contusion of right kidney, subsequent encounter: Secondary | ICD-10-CM | POA: Diagnosis not present

## 2019-09-06 DIAGNOSIS — J988 Other specified respiratory disorders: Secondary | ICD-10-CM | POA: Diagnosis not present

## 2019-09-06 NOTE — Progress Notes (Signed)
Jim Tucker is a 78 y.o. male who presents to [facility]: [facility] today for follow-up complicated UTI.  Jim Tucker was seen in the hospital recently for ESBL UTI.  He had a 2-week course of IV ertapenem.  This ended recently however the midline is still in his left arm.  He is feeling a lot better with no fevers or urine symptoms.  In the hospital he also had a right kidney subcapsular hematoma and followed up with urology with plan to recheck CT scan in November.  He is feeling fine.  Additionally he had iron deficiency.  He has been taking oral iron supplementation and feeling better.   ROS as above:  Exam:  BP 135/79   Pulse 91   Temp 97.8 F (36.6 C) (Oral)   Wt 129 lb (58.5 kg)   BMI 24.37 kg/m   Wt Readings from Last 5 Encounters:  09/06/19 129 lb (58.5 kg)  09/01/19 129 lb (58.5 kg)  08/20/19 121 lb 7.6 oz (55.1 kg)  08/19/19 122 lb (55.3 kg)  08/05/19 117 lb (53.1 kg)    Gen: Well NAD HEENT: EOMI,  MMM Lungs: Normal work of breathing. CTABL Heart: RRR no MRG Abd: NABS, Soft. Nondistended, Nontender Exts: Brisk capillary refill, warm and well perfused.    Midline removed left upper arm.  No bleeding.  No wound erythema induration or expressible pus.     Assessment and Plan: 78 y.o. male with complicated UTI with E. coli ESBL bacteria.  Clinically doing well off of the IV antibiotics.  Plan for test of cure today with urine culture.  Patient has follow-up appointment scheduled with urology.   Check back my office in 3 to 6 months if all is well.  Will check hemoglobin at that point.  Will send copy of this note and urine culture results to [facility].   PDMP not reviewed this encounter. Orders Placed This Encounter  Procedures  . Urine Culture   No orders of the defined types were placed in this encounter.    Historical information  moved to improve visibility of documentation.  Past Medical History:  Diagnosis Date  . Arthritis    shoulders, back  . BPH (benign prostatic hypertrophy)    takes Proscar daily  . Diverticulosis   . History of 2019 novel coronavirus disease (COVID-19) 08/05/2019   Required hospitalization discharged August 2020  . History of colon polyps   . Hyperlipidemia    takes Atorvastatin daily  . Internal hemorrhoid   . LBP (low back pain)    HNP  . Nocturia   . Numbness and tingling    in right leg   Past Surgical History:  Procedure Laterality Date  . BACK SURGERY    . COLONOSCOPY    . CYSTOSCOPY WITH BIOPSY N/A 07/19/2013   Procedure: TRANSURETHRAL RESECTION OF BLADDER NECK;  Surgeon: Bernestine Amass, MD;  Location: [facility];  Service: Urology;  Laterality: N/A;  . LUMBAR LAMINECTOMY Right 08/12/2014   Procedure: Right L2-3 Hemilaminectomy, Removal HNP;  Surgeon: Marybelle Killings, MD;  Location: [facility];  Service: Orthopedics;  Laterality: Right;  . LUMBAR SPINE SURGERY     x 2  . NASAL SINUS SURGERY  40 yrs ago  . PROSTATE BIOPSY  2007, 2010   benign.  Urology - Dr. Ellwood Sayers  . ROTATOR CUFF REPAIR Bilateral    total of 3  . TRANSURETHRAL RESECTION OF  PROSTATE  10-23-2009   Social History   Tobacco Use  . Smoking status: Former Research scientist (life sciences)  . Smokeless tobacco: Never Used  . Tobacco comment: quit smoking in Mar 2015  Substance Use Topics  . Alcohol use: Yes    Comment: wine occasionally   family history includes Stomach cancer in his brother.  Medications: Current Outpatient Medications  Medication Sig Dispense Refill  . atorvastatin (LIPITOR) 80 MG tablet Take 40 mg by mouth daily.     . feeding supplement, ENSURE ENLIVE, (ENSURE ENLIVE) LIQD Take 237 mLs by mouth 2 (two) times daily between meals. 5000 mL 0  . Ferrous Bisglycinate Chelate 28 MG CAPS Take 1 capsule by mouth 2 (two) times daily. 60 capsule 1  . ferrous [id number] C-folic acid (TRINSICON /  FOLTRIN) capsule Take 1 capsule by mouth 2 (two) times daily after a meal. 60 capsule 0  . finasteride (PROSCAR) 5 MG tablet Take 1 tablet (5 mg total) by mouth daily. 90 tablet 3   No current facility-administered medications for this visit.    Allergies  Allergen Reactions  . Other Other (See Comments)     Raw Apples swells throat     Discussed warning signs or symptoms. Please see discharge instructions. Patient expresses understanding.

## 2019-09-08 LAB — URINE CULTURE
MICRO NUMBER:: 929717
SPECIMEN QUALITY:: ADEQUATE

## 2019-09-13 ENCOUNTER — Encounter: Payer: Self-pay | Admitting: Internal Medicine

## 2019-09-13 ENCOUNTER — Ambulatory Visit (INDEPENDENT_AMBULATORY_CARE_PROVIDER_SITE_OTHER): Payer: Medicare Other | Admitting: Internal Medicine

## 2019-09-13 ENCOUNTER — Other Ambulatory Visit: Payer: Self-pay

## 2019-09-13 DIAGNOSIS — R8271 Bacteriuria: Secondary | ICD-10-CM | POA: Insufficient documentation

## 2019-09-13 NOTE — Progress Notes (Signed)
[facility]      Reason for Consult: recent ESBL presumed urinary infection    Referring Physician: Dr. Georgina Snell    Patient ID: Jim Tucker, male    DOB: Jan 02, 1941, 78 y.o.   MRN: [id number]  HPI:   He is here for evaluation after a recent hospitalization for ESBL UTI.   He has a history of COVID-19 and was hospitalized in early August with oxygen requirements and significant weight loss.  He went to the ED 08/07/19 with symptoms of body aches including some flank pain and fatigue with subjective fever and chills.  He had no urinary symptoms including no burning with urination, no dysuria, and he reports he only noted some change in smell of his urine, which is persistent now.  A urinalysis was checked though and noted WBCs and nitrites and he was given Keflex.  Urine culture though grew out ESBL E coli (< 100,000 colonies) and after delay getting in touch with him, he was sent to the hospital on 08/19/19.  Per the notes, he had reported flank pain with urinary frequency for 2 weeks.  He was admitted and put on treatment with ertapenem for 2 weeks.  He had a repeat urine culture after treatment and was negative.   Previous record reviewed from hospitalization and after.    Past Medical History:  Diagnosis Date  . Arthritis    shoulders, back  . BPH (benign prostatic hypertrophy)    takes Proscar daily  . Diverticulosis   . History of 2019 novel coronavirus disease (COVID-19) 08/05/2019   Required hospitalization discharged August 2020  . History of colon polyps   . Hyperlipidemia    takes Atorvastatin daily  . Internal hemorrhoid   . LBP (low back pain)    HNP  . Nocturia   . Numbness and tingling    in right leg    Prior to Admission medications   Medication Sig Start Date End Date Taking? Authorizing Provider  atorvastatin (LIPITOR) 80 MG tablet Take 40 mg by mouth daily.  07/23/19  Yes [provider]  feeding supplement, ENSURE ENLIVE,  (ENSURE ENLIVE) LIQD Take 237 mLs by mouth 2 (two) times daily between meals. 08/20/19  Yes Lavina Hamman, MD  ferrous [id number] C-folic acid (TRINSICON / FOLTRIN) capsule Take 1 capsule by mouth 2 (two) times daily after a meal. 08/20/19  Yes Lavina Hamman, MD  finasteride (PROSCAR) 5 MG tablet Take 1 tablet (5 mg total) by mouth daily. 08/05/19  Yes Gregor Hams, MD  Ferrous Bisglycinate Chelate 28 MG CAPS Take 1 capsule by mouth 2 (two) times daily. Patient not taking: Reported on 09/13/2019 09/01/19   Gregor Hams, MD    Allergies  Allergen Reactions  . Other Other (See Comments)     Raw Apples swells throat    Social History   Tobacco Use  . Smoking status: Former Research scientist (life sciences)  . Smokeless tobacco: Never Used  . Tobacco comment: quit smoking in Mar 2015  Substance Use Topics  . Alcohol use: Yes    Comment: wine occasionally  . Drug use: No    Family History  Problem Relation Age of Onset  . Stomach cancer Brother   . Colon polyps Neg Hx   . Esophageal cancer Neg Hx   . Rectal cancer Neg Hx   . Pancreatic cancer Neg Hx     Review of Systems  Constitutional: negative for fevers and chills Gastrointestinal: negative for  diarrhea Genitourinary: negative for frequency, dysuria and nocturia All other systems reviewed and are negative    Constitutional: in no apparent distress  Vitals:   09/13/19 0956  BP: (!) 152/82  Pulse: 83  Temp: (!) 97.4 F (36.3 C)   EYES: anicteric  Labs: Lab Results  Component Value Date   WBC 7.1 08/20/2019   HGB 9.0 (L) 08/20/2019   HCT 27.6 (L) 08/20/2019   MCV 93.9 08/20/2019   PLT 153 08/20/2019    Lab Results  Component Value Date   CREATININE 0.54 (L) 08/20/2019   BUN 28 (H) 08/20/2019   NA 137 08/20/2019   K 3.1 (L) 08/20/2019   CL 102 08/20/2019   CO2 24 08/20/2019    Lab Results  Component Value Date   ALT 27 08/20/2019   AST 14 (L) 08/20/2019   ALKPHOS 86 08/20/2019   BILITOT 0.8 08/20/2019   INR 1.1  08/19/2019     Assessment: UTI vs asymptomatic bacteruria.  I am unclear if and how symptomatic he was with his urinary findings.  It looks according to the notes, that he did have urinary concerns but he denies that to me now.  Regardless, He has been treated for a very prolonged course.  Though his follow up urine culture is negative, it is not useful as a test of cure.  I recommended to him that in the absence of fever with associated urinary frequency or dysuria that further evaluation and treatment not indicated.   Plan: 1) no further indication for any treatment or evaluation. 45 minutes spent including extensive review of the record and 25 minutes face to face counseling and discussion of above.

## 2019-09-27 ENCOUNTER — Encounter: Payer: Self-pay | Admitting: Orthopedic Surgery

## 2019-09-27 ENCOUNTER — Ambulatory Visit (INDEPENDENT_AMBULATORY_CARE_PROVIDER_SITE_OTHER): Payer: Medicare Other

## 2019-09-27 ENCOUNTER — Other Ambulatory Visit: Payer: Self-pay

## 2019-09-27 ENCOUNTER — Ambulatory Visit (INDEPENDENT_AMBULATORY_CARE_PROVIDER_SITE_OTHER): Payer: Medicare Other | Admitting: Orthopedic Surgery

## 2019-09-27 VITALS — Ht 61.0 in | Wt 129.0 lb

## 2019-09-27 DIAGNOSIS — G8929 Other chronic pain: Secondary | ICD-10-CM

## 2019-09-27 DIAGNOSIS — M545 Low back pain: Secondary | ICD-10-CM

## 2019-10-12 DIAGNOSIS — N151 Renal and perinephric abscess: Secondary | ICD-10-CM | POA: Diagnosis not present

## 2019-10-12 DIAGNOSIS — N200 Calculus of kidney: Secondary | ICD-10-CM | POA: Diagnosis not present

## 2019-10-12 NOTE — Progress Notes (Signed)
Office Visit Note   Patient: Jim Tucker           Date of Birth: Feb 12, 1941           MRN: [id number] Visit Date: 09/27/2019              Requested by: Gregor Hams, MD [address] [place],  [place] [address] PCP: Gregor Hams, MD  Chief Complaint  Patient presents with  . Right Shoulder - Pain  . Right Hip - Pain      HPI: Patient is a 78 year old gentleman who presents complaining of right shoulder and right hip pain.  Patient denies any groin pain.  Patient states the symptoms of gotten worse since the weather has changed.  Patient states he has had 7 months of right shoulder pain he is status post rotator cuff repair years ago.  Patient states he has seen Dr. Lorin Mercy in the past for his lumbar spine.  He states he has pain radiating down the lateral aspect of the right thigh with radicular pain denies any trauma.  Assessment & Plan: Visit Diagnoses:  1. Chronic right-sided low back pain without sciatica     Plan: We will have patient follow-up with Dr. Lorin Mercy or Dr. Rosey Bath for evaluation for surgical intervention for the lumbar spine.  Follow-Up Instructions: No follow-ups on file.   Ortho Exam  Patient is alert, oriented, no adenopathy, well-dressed, normal affect, normal respiratory effort. Examination of the right shoulder he has internal and external rotation 20 degrees abduction flexion only 70 degrees.  There is pain with Neer and Hawkins impingement test.  Patient has radicular pain down the lateral aspect of the right thigh with weakness in all motor groups of the right lower extremity.  Radiographs of the right hip shows no bony abnormalities radiographs of the lumbar spine shows degenerative collapse throughout the lumbar spine with a fusion at L5-S1.  Patient has superior migration of humeral head within the glenoid.  Imaging: No results found. No images are attached to the encounter.  Labs: Lab Results  Component Value Date   HGBA1C 6.3  (H) 07/16/2019   HGBA1C 6.2 (H) 05/12/2018   HGBA1C 6.4 (H) 05/08/2017   ESRSEDRATE 72 (H) 08/19/2019   ESRSEDRATE 1 04/16/2016   CRP 22.9 (H) 08/19/2019   CRP 3.8 (H) 07/22/2019   CRP 6.2 (H) 07/21/2019   REPTSTATUS 08/24/2019 FINAL 08/19/2019   CULT  08/19/2019    NO GROWTH 5 DAYS Performed at [facility], [place] [address] [place], [place] [address]    LABORGA ESCHERICHIA COLI (A) 08/07/2019     Lab Results  Component Value Date   ALBUMIN 2.2 (L) 08/20/2019   ALBUMIN 2.5 (L) 08/19/2019   ALBUMIN 2.3 (L) 07/22/2019   PREALBUMIN 11.5 (L) 08/20/2019    Lab Results  Component Value Date   MG 2.0 08/20/2019   MG 2.2 07/16/2019   No results found for: [facility]  Lab Results  Component Value Date   PREALBUMIN 11.5 (L) 08/20/2019   CBC EXTENDED Latest Ref Rng & Units 08/20/2019 08/20/2019 08/19/2019  WBC 4.0 - 10.5 K/uL 7.1 - 9.4  RBC 4.22 - 5.81 MIL/uL 2.94(L) 2.94(L) 3.37(L)  HGB 13.0 - 17.0 g/dL 9.0(L) - 10.3(L)  HCT 39.0 - 52.0 % 27.6(L) - 31.4(L)  PLT 150 - 400 K/uL 153 - 160  NEUTROABS 1.7 - 7.7 K/uL - - 7.5  LYMPHSABS 0.7 - 4.0 K/uL - - 1.0  Body mass index is 24.37 kg/m.  Orders:  Orders Placed This Encounter  Procedures  . XR Lumbar Spine 2-3 Views  . XR Shoulder Right  . XR HIP UNILAT W OR W/O PELVIS 2-3 VIEWS RIGHT   No orders of the defined types were placed in this encounter.    Procedures: No procedures performed  Clinical Data: No additional findings.  ROS:  All other systems negative, except as noted in the HPI. Review of Systems  Objective: Vital Signs: Ht 5\' 1"  (1.549 m)   Wt 129 lb (58.5 kg)   BMI 24.37 kg/m   Specialty Comments:  No specialty comments available.  PMFS History: Patient Active Problem List   Diagnosis Date Noted  . Asymptomatic bacteriuria 09/13/2019  . Renal hematoma 08/20/2019  . Iron deficiency anemia 08/20/2019  . ESBL (extended spectrum beta-lactamase) producing bacteria infection 08/19/2019  .  History of 2019 novel coronavirus disease (COVID-19) 08/05/2019  . Protein-calorie malnutrition ([place]) 08/05/2019  . Respiratory tract infection due to COVID-19 virus 07/15/2019  . Internal hemorrhoid 10/15/2018  . Onychomycosis 08/12/2018  . Prediabetes 05/12/2018  . HTN (hypertension) 02/25/2017  . HNP (herniated nucleus pulposus), lumbar 08/08/2014  . COLONIC POLYPS 03/28/2008  . DIVERTICULOSIS OF COLON 03/28/2008  . BPH (benign prostatic hyperplasia) 09/10/2007  . PTERYGIUM NOS 03/02/2007  . DYSPEPSIA 12/03/2006  . SEBORRHEIC KERATOSIS 11/18/2006  . Hyperlipidemia 11/13/2006  . DYSRHYTHMIA, CARDIAC NOS 11/13/2006   Past Medical History:  Diagnosis Date  . Arthritis    shoulders, back  . BPH (benign prostatic hypertrophy)    takes Proscar daily  . Diverticulosis   . History of 2019 novel coronavirus disease (COVID-19) 08/05/2019   Required hospitalization discharged August 2020  . History of colon polyps   . Hyperlipidemia    takes Atorvastatin daily  . Internal hemorrhoid   . LBP (low back pain)    HNP  . Nocturia   . Numbness and tingling    in right leg    Family History  Problem Relation Age of Onset  . Stomach cancer Brother   . Colon polyps Neg Hx   . Esophageal cancer Neg Hx   . Rectal cancer Neg Hx   . Pancreatic cancer Neg Hx     Past Surgical History:  Procedure Laterality Date  . BACK SURGERY    . COLONOSCOPY    . CYSTOSCOPY WITH BIOPSY N/A 07/19/2013   Procedure: TRANSURETHRAL RESECTION OF BLADDER NECK;  Surgeon: Bernestine Amass, MD;  Location: [facility];  Service: Urology;  Laterality: N/A;  . LUMBAR LAMINECTOMY Right 08/12/2014   Procedure: Right L2-3 Hemilaminectomy, Removal HNP;  Surgeon: Marybelle Killings, MD;  Location: [facility];  Service: Orthopedics;  Laterality: Right;  . LUMBAR SPINE SURGERY     x 2  . NASAL SINUS SURGERY  40 yrs ago  . PROSTATE BIOPSY  2007, 2010   benign.  Urology - Dr. Ellwood Sayers  . ROTATOR CUFF REPAIR Bilateral     total of 3  . TRANSURETHRAL RESECTION OF PROSTATE  10-23-2009   Social History   Occupational History  . Occupation: Retired Clinical biochemist.     Employer: RETIRED  Tobacco Use  . Smoking status: Former Research scientist (life sciences)  . Smokeless tobacco: Never Used  . Tobacco comment: quit smoking in Mar 2015  Substance and Sexual Activity  . Alcohol use: Yes    Comment: wine occasionally  . Drug use: No  . 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

## 2019-10-18 DIAGNOSIS — N151 Renal and perinephric abscess: Secondary | ICD-10-CM | POA: Diagnosis not present

## 2019-10-27 ENCOUNTER — Ambulatory Visit (INDEPENDENT_AMBULATORY_CARE_PROVIDER_SITE_OTHER): Payer: Medicare Other | Admitting: Orthopaedic Surgery

## 2019-10-27 ENCOUNTER — Encounter: Payer: Self-pay | Admitting: Orthopaedic Surgery

## 2019-10-27 ENCOUNTER — Other Ambulatory Visit: Payer: Self-pay

## 2019-10-27 VITALS — BP 123/71 | HR 73 | Ht 60.0 in | Wt 126.0 lb

## 2019-10-27 DIAGNOSIS — Z981 Arthrodesis status: Secondary | ICD-10-CM | POA: Diagnosis not present

## 2019-10-27 DIAGNOSIS — M5126 Other intervertebral disc displacement, lumbar region: Secondary | ICD-10-CM | POA: Diagnosis not present

## 2019-10-27 DIAGNOSIS — M4726 Other spondylosis with radiculopathy, lumbar region: Secondary | ICD-10-CM | POA: Diagnosis not present

## 2019-10-27 NOTE — Progress Notes (Signed)
Office Visit Note   Patient: Jim Tucker           Date of Birth: 16-Aug-1941           MRN: [id number] Visit Date: 10/27/2019              Requested by: Gregor Hams, MD [address] [place],  [place] [address] PCP: Gregor Hams, MD   Assessment & Plan: Visit Diagnoses:  1. HNP (herniated nucleus pulposus), lumbar   2. Other spondylosis with radiculopathy, lumbar region   3. History of lumbar fusion     Plan: We discussed the potentially obtaining an MRI scan lumbar spine.  His legs actually gotten a little bit stronger he still has claudication type symptoms and right greater than left quad weakness.  He can proceed with urologic work-up and once this is completed he can come see me at the beginning of the year if he still having problems with his back and we can consider lumbar imaging MRI scan with and without contrast.  He also states he might consider getting his shoulder problem reevaluated with Dr. Sharol Given but I discussed with him likely that his rotator cuff may have an extremely large tear with significant retraction and muscle atrophy.  Follow-Up Instructions: Return if symptoms worsen or fail to improve.   Orders:  No orders of the defined types were placed in this encounter.  No orders of the defined types were placed in this encounter.     Procedures: No procedures performed   Clinical Data: No additional findings.   Subjective: Chief Complaint  Patient presents with  . Lower Back - Pain    HPI 78 year old male from [place] who was recently at home improved there for several months had Covid while he was there in August with now negative test.  While he was improved was bending down underneath a cabinet or counter had sharp pain and had 3 days were he was not able to get up and get around.  He had previous lumbar fusion years ago at L4-5 done in [place].  Patient had right L2-3 disc herniation 2015 with surgery by me with hemilaminectomy and  removal of HNP.  He did well after that and now states he has increased problems with claudication not able to walk very far has some weakness in his legs which actually is improved to some degree no numbness or tingling just quad weakness.  He has had problems with infection in his kidney not sure if this is related to Covid or previous kidney infection.  This is on the right with some scar tissue and he has a new CT scan coming up December 14 and a follow-up with the urologist 11/29/2019.  Patient has been ambulatory he has a cane but has not been using it.  No associated bowel or bladder symptoms other than above-mentioned kidney problem on the right.  Is also had problems with the shoulder previous rotator cuff repair now has high riding head abutting against the acromium multiple metal anchors from previous rotator cuff repair and likely has significant rotator cuff atrophy with rotator cuff arthropathy currently.  He still able get his arm up over his head but has trouble with outstretch reaching and overhead lifting.  Lumbar x-rays obtained by Dr. Sharol Given showed instrumented fusion single level fusion L4-5.  He has large lateral osteophytes at 3 4 and 2 3 with multilevels of disc space narrowing and facet arthropathy.  Negative  for destructive or sclerotic lesions other than the osteophytes and endplate sclerosis.  Review of Systems noncontributory to chief complaint other than as mentioned in HPI.   Objective: Vital Signs: BP 123/71   Pulse 73   Ht 5' (1.524 m)   Wt 126 lb (57.2 kg)   BMI 24.61 kg/m   Physical Exam Constitutional:      Appearance: He is well-developed.  HENT:     Head: Normocephalic and atraumatic.  Eyes:     Pupils: Pupils are equal, round, and reactive to light.  Neck:     Thyroid: No thyromegaly.     Trachea: No tracheal deviation.  Cardiovascular:     Rate and Rhythm: Normal rate.  Pulmonary:     Effort: Pulmonary effort is normal.     Breath sounds: No  wheezing.  Abdominal:     General: Bowel sounds are normal.     Palpations: Abdomen is soft.  Skin:    General: Skin is warm and dry.     Capillary Refill: Capillary refill takes less than 2 seconds.  Neurological:     Mental Status: He is alert and oriented to person, place, and time.  Psychiatric:        Behavior: Behavior normal.        Thought Content: Thought content normal.        Judgment: Judgment normal.     Ortho Exam patient has trace knee jerk right and left.  He has some right quad weakness on testing no hip flexion weakness.  Anterior tib gastrocsoleus is intact.  Well-healed lumbar incision.  Specialty Comments:  No specialty comments available.  Imaging: CLINICAL DATA:  Right flank pain, recent COVID-19 diagnosis  EXAM: CT ABDOMEN AND PELVIS WITHOUT CONTRAST  TECHNIQUE: Multidetector CT imaging of the abdomen and pelvis was performed following the standard protocol without IV contrast.  COMPARISON:  CT 08/10/2013  FINDINGS: Lower chest: Bibasilar areas of atelectasis. Stable calcified granuloma in the left lower lobe. Patchy areas of ground-glass opacity are present in both lung bases. There is a small right pleural effusion and adjacent passive atelectasis.  Hepatobiliary: No focal liver abnormality is seen. No gallstones, gallbladder wall thickening, or biliary dilatation.  Pancreas: Unremarkable. No pancreatic ductal dilatation or surrounding inflammatory changes.  Spleen: Normal in size without focal abnormality.  Adrenals/Urinary Tract: Normal adrenal glands. There is a hyperattenuating (60 HU) right perinephric hematoma with retroperitoneal hemorrhage within the perirenal and pararenal spaces. Evaluation for an underlying lesion is limited in the absence of contrast media. No visible or contour deforming right renal lesions are seen fluid attenuation cyst measuring 2.3 cm arising from the lower pole left kidney. Additional fluid  attenuation 2 cm left renal sinus cyst. No hydronephrosis. Mild bladder wall thickening is present.  Stomach/Bowel: Distal esophagus, stomach and duodenal sweep are unremarkable. No bowel wall thickening or dilatation. No evidence of obstruction. Scattered colonic diverticula without focal pericolonic inflammation to suggest diverticulitis. A normal appendix is visualized.  Vascular/Lymphatic: Atherosclerotic plaque within the normal caliber aorta. Hypoattenuation of the cardiac blood pool relative to the myocardium is suggestive of anemia. No suspicious or enlarged lymph nodes in the included lymphatic chains.  Reproductive: Nyoka Lint defect the level of the prostate likely related to prior TURP.  Other: Retroperitoneal hemorrhage, as above. No free intraperitoneal fluid. No free gas.  Musculoskeletal: Multilevel degenerative changes of the spine. L4-5 posterior spinal fusion. Stepwise retrolisthesis L1-L3, similar to prior. No acute osseous abnormality or suspicious osseous lesion.  IMPRESSION:  1. Moderate right subcapsular renal hematoma with additional hemorrhage and stranding present in the perirenal and pararenal retroperitoneal spaces. Possibly spontaneous in the absence of visible underlying lesion though evaluation is somewhat limited in the absence of contrast. 2. Small right pleural effusion with adjacent passive atelectasis. 3. Patchy areas of ground-glass opacity in both lung bases, which may reflect atelectasis or infection. 4. Stable calcified granuloma in the left lung base 5. Diverticulosis without evidence of diverticulitis. 6. Prior L4-5 posterior spinal fusion. 7. Aortic Atherosclerosis (ICD10-I70.0).  These results were called by telephone at the time of interpretation on 08/19/2019 at 10:08 pm to provider [facility] , who verbally acknowledged these results.   Electronically Signed   By: Lovena Le M.D.   PMFS History: Patient Active  Problem List   Diagnosis Date Noted  . Other spondylosis with radiculopathy, lumbar region 10/27/2019  . History of lumbar fusion 10/27/2019  . Asymptomatic bacteriuria 09/13/2019  . Renal hematoma 08/20/2019  . Iron deficiency anemia 08/20/2019  . ESBL (extended spectrum beta-lactamase) producing bacteria infection 08/19/2019  . History of 2019 novel coronavirus disease (COVID-19) 08/05/2019  . Protein-calorie malnutrition ([place]) 08/05/2019  . Respiratory tract infection due to COVID-19 virus 07/15/2019  . Internal hemorrhoid 10/15/2018  . Onychomycosis 08/12/2018  . Prediabetes 05/12/2018  . HTN (hypertension) 02/25/2017  . HNP (herniated nucleus pulposus), lumbar 08/08/2014  . COLONIC POLYPS 03/28/2008  . DIVERTICULOSIS OF COLON 03/28/2008  . BPH (benign prostatic hyperplasia) 09/10/2007  . PTERYGIUM NOS 03/02/2007  . DYSPEPSIA 12/03/2006  . SEBORRHEIC KERATOSIS 11/18/2006  . Hyperlipidemia 11/13/2006  . DYSRHYTHMIA, CARDIAC NOS 11/13/2006   Past Medical History:  Diagnosis Date  . Arthritis    shoulders, back  . BPH (benign prostatic hypertrophy)    takes Proscar daily  . Diverticulosis   . History of 2019 novel coronavirus disease (COVID-19) 08/05/2019   Required hospitalization discharged August 2020  . History of colon polyps   . Hyperlipidemia    takes Atorvastatin daily  . Internal hemorrhoid   . LBP (low back pain)    HNP  . Nocturia   . Numbness and tingling    in right leg    Family History  Problem Relation Age of Onset  . Stomach cancer Brother   . Colon polyps Neg Hx   . Esophageal cancer Neg Hx   . Rectal cancer Neg Hx   . Pancreatic cancer Neg Hx     Past Surgical History:  Procedure Laterality Date  . BACK SURGERY    . COLONOSCOPY    . CYSTOSCOPY WITH BIOPSY N/A 07/19/2013   Procedure: TRANSURETHRAL RESECTION OF BLADDER NECK;  Surgeon: Bernestine Amass, MD;  Location: [facility];  Service: Urology;  Laterality: N/A;  . LUMBAR  LAMINECTOMY Right 08/12/2014   Procedure: Right L2-3 Hemilaminectomy, Removal HNP;  Surgeon: Marybelle Killings, MD;  Location: [facility];  Service: Orthopedics;  Laterality: Right;  . LUMBAR SPINE SURGERY     x 2  . NASAL SINUS SURGERY  40 yrs ago  . PROSTATE BIOPSY  2007, 2010   benign.  Urology - Dr. Ellwood Sayers  . ROTATOR CUFF REPAIR Bilateral    total of 3  . TRANSURETHRAL RESECTION OF PROSTATE  10-23-2009   Social History   Occupational History  . Occupation: Retired Clinical biochemist.     Employer: RETIRED  Tobacco Use  . Smoking status: Former Research scientist (life sciences)  . Smokeless tobacco: Never Used  . Tobacco comment: quit smoking in Mar 2015  Substance and Sexual Activity  . Alcohol use: Yes    Comment: wine occasionally  . Drug use: No  . 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

## 2019-11-08 ENCOUNTER — Other Ambulatory Visit (HOSPITAL_COMMUNITY): Payer: Self-pay | Admitting: Urology

## 2019-11-08 DIAGNOSIS — N151 Renal and perinephric abscess: Secondary | ICD-10-CM

## 2019-11-09 ENCOUNTER — Other Ambulatory Visit: Payer: Self-pay | Admitting: Radiology

## 2019-11-10 ENCOUNTER — Ambulatory Visit (HOSPITAL_COMMUNITY)
Admission: RE | Admit: 2019-11-10 | Discharge: 2019-11-10 | Disposition: A | Discharge status: Home / Self Care | Payer: Medicare Other | Source: Ambulatory Visit | Attending: Urology | Admitting: Urology

## 2019-11-10 ENCOUNTER — Other Ambulatory Visit: Payer: Self-pay

## 2019-11-10 ENCOUNTER — Ambulatory Visit (HOSPITAL_COMMUNITY): Payer: Medicare Other

## 2019-11-10 DIAGNOSIS — Z9109 Other allergy status, other than to drugs and biological substances: Secondary | ICD-10-CM | POA: Insufficient documentation

## 2019-11-10 DIAGNOSIS — N400 Enlarged prostate without lower urinary tract symptoms: Secondary | ICD-10-CM | POA: Insufficient documentation

## 2019-11-10 DIAGNOSIS — K651 Peritoneal abscess: Secondary | ICD-10-CM | POA: Diagnosis not present

## 2019-11-10 DIAGNOSIS — E785 Hyperlipidemia, unspecified: Secondary | ICD-10-CM | POA: Diagnosis not present

## 2019-11-10 DIAGNOSIS — N151 Renal and perinephric abscess: Secondary | ICD-10-CM

## 2019-11-10 DIAGNOSIS — Z800 Family history of malignant neoplasm of digestive organs: Secondary | ICD-10-CM | POA: Diagnosis not present

## 2019-11-10 DIAGNOSIS — Z79899 Other long term (current) drug therapy: Secondary | ICD-10-CM | POA: Insufficient documentation

## 2019-11-10 DIAGNOSIS — Z87891 Personal history of nicotine dependence: Secondary | ICD-10-CM | POA: Diagnosis not present

## 2019-11-10 LAB — CBC WITH DIFFERENTIAL/PLATELET
Abs Immature Granulocytes: 0.05 10*3/uL (ref 0.00–0.07)
Basophils Absolute: 0.1 10*3/uL (ref 0.0–0.1)
Basophils Relative: 1 %
Eosinophils Absolute: 0.1 10*3/uL (ref 0.0–0.5)
Eosinophils Relative: 1 %
HCT: 39.1 % (ref 39.0–52.0)
Hemoglobin: 12.1 g/dL — ABNORMAL LOW (ref 13.0–17.0)
Immature Granulocytes: 0 %
Lymphocytes Relative: 17 %
Lymphs Abs: 2.2 10*3/uL (ref 0.7–4.0)
MCH: 27.2 pg (ref 26.0–34.0)
MCHC: 30.9 g/dL (ref 30.0–36.0)
MCV: 87.9 fL (ref 80.0–100.0)
Monocytes Absolute: 0.8 10*3/uL (ref 0.1–1.0)
Monocytes Relative: 6 %
Neutro Abs: 10 10*3/uL — ABNORMAL HIGH (ref 1.7–7.7)
Neutrophils Relative %: 75 %
Platelets: 415 10*3/uL — ABNORMAL HIGH (ref 150–400)
RBC: 4.45 MIL/uL (ref 4.22–5.81)
RDW: 16.1 % — ABNORMAL HIGH (ref 11.5–15.5)
WBC: 13.1 10*3/uL — ABNORMAL HIGH (ref 4.0–10.5)
nRBC: 0 % (ref 0.0–0.2)

## 2019-11-10 LAB — BASIC METABOLIC PANEL
Anion gap: 13 (ref 5–15)
BUN: 42 mg/dL — ABNORMAL HIGH (ref 8–23)
CO2: 22 mmol/L (ref 22–32)
Calcium: 9.6 mg/dL (ref 8.9–10.3)
Chloride: 104 mmol/L (ref 98–111)
Creatinine, Ser: 1.05 mg/dL (ref 0.61–1.24)
GFR calc Af Amer: >60 mL/min (ref >60)
GFR calc non Af Amer: >60 mL/min (ref >60)
Glucose, Bld: 115 mg/dL — ABNORMAL HIGH (ref 70–99)
Potassium: 4 mmol/L (ref 3.5–5.1)
Sodium: 139 mmol/L (ref 135–145)

## 2019-11-10 LAB — PROTIME-INR
INR: 1 (ref 0.8–1.2)
Prothrombin Time: 12.7 seconds (ref 11.4–15.2)

## 2019-11-10 MED ORDER — LIDOCAINE HCL (PF) 1 % IJ SOLN
INTRAMUSCULAR | Status: AC | PRN
  Administered 2019-11-10 (×2): 10 mL

## 2019-11-10 MED ORDER — SODIUM CHLORIDE 0.9 % IV SOLN
INTRAVENOUS | Status: DC
  Administered 2019-11-10: 11:00:00 via INTRAVENOUS

## 2019-11-10 MED ORDER — MIDAZOLAM HCL 2 MG/2ML IJ SOLN
INTRAMUSCULAR | Status: AC | PRN
  Administered 2019-11-10: 0.5 mg via INTRAVENOUS
  Administered 2019-11-10: 1 mg via INTRAVENOUS
  Administered 2019-11-10: 0.5 mg via INTRAVENOUS

## 2019-11-10 MED ORDER — FLUMAZENIL 0.5 MG/5ML IV SOLN
INTRAVENOUS | Status: AC
  Filled 2019-11-10: qty 5

## 2019-11-10 MED ORDER — FENTANYL CITRATE (PF) 100 MCG/2ML IJ SOLN
INTRAMUSCULAR | Status: AC | PRN
  Administered 2019-11-10: 50 ug via INTRAVENOUS

## 2019-11-10 MED ORDER — NALOXONE HCL 0.4 MG/ML IJ SOLN
INTRAMUSCULAR | Status: AC
  Filled 2019-11-10: qty 1

## 2019-11-10 MED ORDER — MIDAZOLAM HCL 2 MG/2ML IJ SOLN
INTRAMUSCULAR | Status: AC
  Filled 2019-11-10: qty 4

## 2019-11-10 MED ORDER — SODIUM CHLORIDE 0.9% FLUSH: 5.0000 mL | Freq: Three times a day (TID) | INTRAVENOUS | Status: DC

## 2019-11-10 MED ORDER — SODIUM CHLORIDE 0.9 % IV SOLN
1.0000 g | Freq: Once | INTRAVENOUS | Status: AC
  Administered 2019-11-10: 1 g via INTRAVENOUS
  Filled 2019-11-10: qty 1

## 2019-11-10 MED ORDER — FENTANYL CITRATE (PF) 100 MCG/2ML IJ SOLN
INTRAMUSCULAR | Status: AC
  Filled 2019-11-10: qty 2

## 2019-11-10 NOTE — Progress Notes (Signed)
Patient ID: Jim Tucker, male   DOB: 01-04-1941, 78 y.o.   MRN: [id number] Care and flushing protocol of right perinephric abscess drain discussed with patient.  He was told to record output.  He will be scheduled for follow-up CT in the IR clinic in 2 weeks.  Patient can contact IR at [phone] or [phone] with any drain related questions.  Prescription for saline flushes also given to patient.

## 2019-11-10 NOTE — Consult Note (Signed)
Chief Complaint: Patient was seen in consultation today for CT-guided aspiration/drainage of the right renal subcapsular fluid collection  Referring Physician(s): [place] Physician: Jacqulynn Cadet  Patient Status: [facility] - Out-pt  History of Present Illness: Jim Tucker is a 78 y.o. male with history of BPH with prior TURP as well as TURBT (benign) 2010/2014, diverticulosis, COVID-19 in August of this year, hyperlipidemia, low back pain with prior back surgeries.  He was recently treated in the hospital for ESBL UTI in September of this year.  At that time he was noted to have a right perinephric hemorrhage/hematoma but no intervention performed. He has been followed by urology since that time.  Latest CT scan done on 10/12/2019 reveals persistent right kidney subcapsular fluid collection which has decreased in volume from previous exam but there has been interval increase in complexity with progressive loculation, mural thickening and enhancement concerning for possible abscess.  Request now received from urology for CT-guided aspiration/possible drainage of right renal subcapsular fluid collection.  Past Medical History:  Diagnosis Date  . Arthritis    shoulders, back  . BPH (benign prostatic hypertrophy)    takes Proscar daily  . Diverticulosis   . History of 2019 novel coronavirus disease (COVID-19) 08/05/2019   Required hospitalization discharged August 2020  . History of colon polyps   . Hyperlipidemia    takes Atorvastatin daily  . Internal hemorrhoid   . LBP (low back pain)    HNP  . Nocturia   . Numbness and tingling    in right leg    Past Surgical History:  Procedure Laterality Date  . BACK SURGERY    . COLONOSCOPY    . CYSTOSCOPY WITH BIOPSY N/A 07/19/2013   Procedure: TRANSURETHRAL RESECTION OF BLADDER NECK;  Surgeon: Bernestine Amass, MD;  Location: [facility];  Service: Urology;  Laterality: N/A;  . LUMBAR  LAMINECTOMY Right 08/12/2014   Procedure: Right L2-3 Hemilaminectomy, Removal HNP;  Surgeon: Marybelle Killings, MD;  Location: [facility];  Service: Orthopedics;  Laterality: Right;  . LUMBAR SPINE SURGERY     x 2  . NASAL SINUS SURGERY  40 yrs ago  . PROSTATE BIOPSY  2007, 2010   benign.  Urology - Dr. Ellwood Sayers  . ROTATOR CUFF REPAIR Bilateral    total of 3  . TRANSURETHRAL RESECTION OF PROSTATE  10-23-2009    Allergies: Other  Medications: Prior to Admission medications   Medication Sig Start Date End Date Taking? Authorizing Provider  atorvastatin (LIPITOR) 80 MG tablet Take 40 mg by mouth daily.  07/23/19   [provider]  feeding supplement, ENSURE ENLIVE, (ENSURE ENLIVE) LIQD Take 237 mLs by mouth 2 (two) times daily between meals. 08/20/19   Lavina Hamman, MD  Ferrous Bisglycinate Chelate 28 MG CAPS Take 1 capsule by mouth 2 (two) times daily. Patient not taking: Reported on 09/13/2019 09/01/19   Gregor Hams, MD  ferrous [id number] C-folic acid (TRINSICON / FOLTRIN) capsule Take 1 capsule by mouth 2 (two) times daily after a meal. 08/20/19   Lavina Hamman, MD  finasteride (PROSCAR) 5 MG tablet Take 1 tablet (5 mg total) by mouth daily. 08/05/19   Gregor Hams, MD     Family History  Problem Relation Age of Onset  . Stomach cancer Brother   . Colon polyps Neg Hx   . Esophageal cancer Neg Hx   . Rectal cancer Neg Hx   . Pancreatic cancer Neg  Hx     Social History   Socioeconomic History  . Marital status: Married    Spouse name: Tretha Sciara  . Number of children: 2   . Years of education: Not on file  . Highest education level: Not on file  Occupational History  . Occupation: Retired Clinical biochemist.     Employer: RETIRED  Social Needs  . Financial resource strain: Not on file  . Food insecurity    Worry: Not on file    Inability: Not on file  . Transportation needs    Medical: Not on file    Non-medical: Not on file  Tobacco Use  . Smoking status: Former  Research scientist (life sciences)  . Smokeless tobacco: Never Used  . Tobacco comment: quit smoking in Mar 2015  Substance and Sexual Activity  . Alcohol use: Yes    Comment: wine occasionally  . Drug use: No  . 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  Lifestyle  . Physical activity    Days per week: Not on file    Minutes per session: Not on file  . Stress: Not on file  Relationships  . Social Herbalist on phone: Not on file    Gets together: Not on file    Attends religious service: Not on file    Active member of club or organization: Not on file    Attends meetings of clubs or organizations: Not on file    Relationship status: Not on file  Other Topics Concern  . 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      Review of Systems currently denies fever, headache, chest pain, dyspnea, cough, nausea, vomiting, hematuria/dysuria.  He does have some mild right lateral abdominal/back pain.   Vital Signs: Blood pressure 129/87, heart rate 58, temperature 97.8, O2 sat 94% room air   Physical Exam awake, alert.  Chest clear to auscultation bilaterally.  Heart with slightly bradycardic but regular rhythm.  Abdomen soft, positive bowel sounds, mild right lateral abdominal tenderness to palpation; no lower extremity edema  Imaging: No results found.  Labs:  CBC: Recent Labs    08/05/19 0847 08/19/19 1336 08/19/19 2031 08/20/19 0653  WBC 11.7* 10.0 9.4 7.1  HGB 16.1 12.6* 10.3* 9.0*  HCT 48.2 36.9* 31.4* 27.6*  PLT 119* 179 160 153    COAGS: Recent Labs    08/19/19 2138  INR 1.1  APTT 33    BMP: Recent Labs    08/05/19 0847 08/19/19 1336 08/19/19 2031 08/20/19 0653  NA 134* 134* 135 137  K 4.4 3.9 3.5 3.1*  CL 98 100 104 102  CO2 26 23 22 24   GLUCOSE 207* 150* 193* 136*  BUN 33* 27* 28* 28*  CALCIUM 8.7  8.6 8.3* 8.2*  CREATININE 0.65* 0.62* 0.63 0.54*  GFRNONAA 93 95 >60 >60  GFRAA 108 110 >60 >60    LIVER FUNCTION TESTS: Recent Labs    07/21/19 0255 07/22/19 0110 08/05/19 0847 08/19/19 1336 08/19/19 2031 08/20/19 0653  BILITOT 0.7 0.4 0.7 0.8 0.6 0.8  AST 25 16 24 25 17  14*  ALT 101* 75* 56* 36 33 27  ALKPHOS 140* 128*  --   --  96 86  PROT 5.3* 5.3* 5.7* 6.1 5.6* 5.1*  ALBUMIN 2.3* 2.3*  --   --  2.5* 2.2*  TUMOR MARKERS: No results for input(s): AFPTM, CEA, CA199, CHROMGRNA in the last 8760 hours.  Assessment and Plan: 78 y.o. male with history of BPH with prior TURP as well as TURBT (benign) 2010/2014, diverticulosis, COVID-19 in August of this year, hyperlipidemia, low back pain with prior back surgeries.  He was recently treated in the hospital for ESBL UTI in September of this year.  At that time he was noted to have a right perinephric hemorrhage/hematoma but no intervention performed. He has been followed by urology since that time.  Latest CT scan done on 10/12/2019 reveals persistent right kidney subcapsular fluid collection which has decreased in volume from previous exam but there has been interval increase in complexity with progressive loculation, mural thickening and enhancement concerning for possible abscess.  Request now received from urology for CT-guided aspiration/possible drainage of right renal subcapsular fluid collection.  Details/risks of procedure, including but not limited to, internal bleeding, infection, injury to adjacent structures discussed with patient with his understanding and consent.  WBC today 13.1, hemoglobin 12.1, platelets 415k, creatinine normal, PT/INR normal  Thank you for this interesting consult.  I greatly enjoyed meeting Andrews Mazzie and look forward to participating in their care.  A copy of this report was sent to the requesting provider on this date.  Electronically Signed: D. Rowe Robert, PA-C 11/10/2019, 10:36 AM   I  spent a total of 25 minutes  in face to face in clinical consultation, greater than 50% of which was counseling/coordinating care for CT-guided aspiration/possible drainage of right renal subcapsular fluid collection

## 2019-11-10 NOTE — Discharge Instructions (Signed)
The [facility] will be following up with you about an appointment to get your drain removed in the next 2 weeks.     Moderate Conscious Sedation, Adult, Care After These instructions provide you with information about caring for yourself after your procedure. Your health care provider may also give you more specific instructions. Your treatment has been planned according to current medical practices, but problems sometimes occur. Call your health care provider if you have any problems or questions after your procedure. What can I expect after the procedure? After your procedure, it is common:  To feel sleepy for several hours.  To feel clumsy and have poor balance for several hours.  To have poor judgment for several hours.  To vomit if you eat too soon. Follow these instructions at home: For at least 24 hours after the procedure:   Do not: ? Participate in activities where you could fall or become injured. ? Drive. ? Use heavy machinery. ? Drink alcohol. ? Take sleeping pills or medicines that cause drowsiness. ? Make important decisions or sign legal documents. ? Take care of children on your own.  Rest. Eating and drinking  Follow the diet recommended by your health care provider.  If you vomit: ? Drink water, juice, or soup when you can drink without vomiting. ? Make sure you have little or no nausea before eating solid foods. General instructions  Have a responsible adult stay with you until you are awake and alert.  Take over-the-counter and prescription medicines only as told by your health care provider.  If you smoke, do not smoke without supervision.  Keep all follow-up visits as told by your health care provider. This is important. Contact a health care provider if:  You keep feeling nauseous or you keep vomiting.  You feel light-headed.  You develop a rash.  You have a fever. Get help right away if:  You have trouble breathing. This information is  not intended to replace advice given to you by your health care provider. Make sure you discuss any questions you have with your health care provider. Document Released: 09/15/2013 Document Revised: 11/07/2017 Document Reviewed: 03/16/2016 Elsevier Patient Education  2020 [place] en los adultos, cuidados posteriores (Moderate Conscious Sedation, Adult, Care After) Estas indicaciones le proporcionan informacin acerca de cmo deber cuidarse despus del procedimiento. El mdico tambin podr darle instrucciones ms especficas. El tratamiento ha sido planificado segn las prcticas mdicas actuales, pero en algunos casos pueden ocurrir problemas. Comunquese con el mdico si tiene algn problema o dudas despus del procedimiento. QU ESPERAR DESPUS DEL PROCEDIMIENTO Despus del procedimiento, es comn:  Sentirse somnoliento durante varias horas.  Sentirse torpe y [employer] de equilibrio durante varias horas.  Perder el sentido de la realidad durante varias horas.  Vomitar si come [employer]. INSTRUCCIONES PARA EL CUIDADO EN EL HOGAR  Durante al menos 24horas despus del procedimiento:  No haga lo siguiente: ? Participar en actividades que impliquen posibles cadas o lesiones. ? Conducir vehculos. ? Operar maquinarias pesadas. ? Beber alcohol. ? Tomar somnferos o medicamentos que causen somnolencia. ? Firmar documentos legales ni tomar [employer]. ? Cuidar a nios por su cuenta.  Hacer reposo. Comida y bebida  Siga la dieta recomendada por el mdico.  Si vomita: ? Pruebe agua, jugo o sopa cuando usted pueda beber sin vomitar. ? Asegrese de no tener nuseas antes de ingerir alimentos slidos. Instrucciones generales  Permanezca con un adulto  responsable hasta que est completamente despierto y consciente.  Tome los medicamentos de venta libre y los recetados solamente como se lo haya indicado el mdico.  Si  fuma, no lo haga sin supervisin.  Concurra a todas las visitas de control como se lo haya indicado el mdico. Esto es importante. SOLICITE ATENCIN MDICA SI:  Sigue teniendo nuseas o vomitando.  Tiene sensacin de desvanecimiento.  Le aparece una erupcin cutnea.  Tiene fiebre. SOLICITE ATENCIN MDICA DE INMEDIATO SI:  Tiene dificultad para respirar. Esta informacin no tiene Marine scientist el consejo del mdico. Asegrese de hacerle al mdico cualquier pregunta que tenga. Document Released: 11/30/2013 Document Revised: 08/01/2016 Document Reviewed: 03/16/2016 Elsevier Patient Education  2020 [place] Surgical drains are used to remove extra fluid that normally builds up in a surgical wound after surgery. A surgical drain helps to heal a surgical wound. Different kinds of surgical drains include:  Active drains. These drains use suction to pull drainage away from the surgical wound. Drainage flows through a tube to a container outside of the body. With these drains, you need to keep the bulb or the drainage container flat (compressed) at all times, except while you empty it. Flattening the bulb or container creates suction.  A drain is placed during surgery. Right after surgery, drainage is usually bright red and a little thicker than water. The drainage may gradually turn yellow or pink and become thinner. It is likely that your health care provider will remove the drain when the drainage stops or when the amount decreases to 1-2 Tbsp (15-30 mL) during a 24-hour period. Supplies needed:  Tape.  Germ-free cleaning solution (sterile saline).  Cotton swabs.  Split gauze drain sponge: 4 x 4 inches (10 x 10 cm).  Gauze square: 4 x 4 inches (10 x 10 cm). How to care for your surgical drain Care for your drain as told by your health care provider. This is important to help prevent infection. If your drain is placed at your back, or any other  hard-to-reach area, ask another person to assist you in performing the following tasks: General care  Keep the skin around the drain dry and covered with a dressing at all times.  Check your drain area every day for signs of infection. Check for: ? Redness, swelling, or pain. ? Pus or a bad smell. ? Cloudy drainage. ? Tenderness or pressure at the drain exit site. Changing the dressing Follow instructions from your health care provider about how to change your dressing. Change your dressing at least once a day. Change it more often if needed to keep the dressing dry. Make sure you: 1. Gather your supplies. 2. Wash your hands with soap and water before you change your dressing. If soap and water are not available, use hand sanitizer. 3. Remove the old dressing. Avoid using scissors to do that. 4. Wash your hands with soap and water again after removing the old dressing. 5. Use sterile saline to clean your skin around the drain. You may need to use a cotton swab to clean the skin. 6. Place the tube through the slit in a drain sponge. Place the drain sponge so that it covers your wound. 7. Place the gauze square or another drain sponge on top of the drain sponge that is on the wound. Make sure the tube is between those layers. 8. Tape the dressing to your skin. 9. Tape the drainage tube to your skin 1-2  inches (2.5-5 cm) below the place where the tube enters your body. Taping keeps the tube from pulling on any stitches (sutures) that you have. 10. Wash your hands with soap and water. 11. Write down the color of your drainage and how often you change your dressing. How to empty your active drain  1. Make sure that you have a measuring cup that you can empty your drainage into. 2. Wash your hands with soap and water. If soap and water are not available, use hand sanitizer. 3. Loosen any pins or clips that hold the tube in place. 4. If your health care provider tells you to strip the tube to  prevent clots and tube blockages: ? Hold the tube at the skin with one hand. Use your other hand to pinch the tubing with your thumb and first finger. ? Gently move your fingers down the tube while squeezing very lightly. This clears any drainage, clots, or tissue from the tube. ? You may need to do this several times each day to keep the tube clear. Do not pull on the tube. 5. Open the bulb cap or the drain plug. Do not touch the inside of the cap or the bottom of the plug. 6. Turn the device upside down and gently squeeze. 7. Empty all of the drainage into the measuring cup. 8. Compress the bulb or the container and replace the cap or the plug. To compress the bulb or the container, squeeze it firmly in the middle while you close the cap or plug the container. 9. Write down the amount of drainage that you have in each 24-hour period. If you have less than 2 Tbsp (30 mL) of drainage during 24 hours, contact your health care provider. 10. Flush the drainage down the toilet. 11. Wash your hands with soap and water. Contact a health care provider if:  You have redness, swelling, or pain around your drain area.  You have pus or a bad smell coming from your drain area.  You have a fever or chills.  The skin around your drain is warm to the touch.  The amount of drainage that you have is increasing instead of decreasing.  You have drainage that is cloudy.  There is a sudden stop or a sudden decrease in the amount of drainage that you have.  Your drain tube falls out.  Your active drain does not stay compressed after you empty it. Summary  Surgical drains are used to remove extra fluid that normally builds up in a surgical wound after surgery.  Different kinds of surgical drains include active drains and passive drains. Active drains use suction to pull drainage away from the surgical wound, and passive drains allow fluid to drain naturally.  It is important to care for your drain to  prevent infection. If your drain is placed at your back, or any other hard-to-reach area, ask another person to assist you.  Contact your health care provider if you have redness, swelling, or pain around your drain area. This information is not intended to replace advice given to you by your health care provider. Make sure you discuss any questions you have with your health care provider. Document Released: 11/22/2000 Document Revised: 12/30/2018 Document Reviewed: 12/30/2018 Elsevier Patient Education  2020 [place] del drenaje quirrgico en el hogar [facility] Los drenajes quirrgicos se utilizan para eliminar el exceso de lquido que normalmente se acumula en una herida quirrgica despus de la [place].  El drenaje quirrgico ayuda a que la herida [place] cicatrice. Entre los distintos tipos de drenajes quirrgicos, se incluyen los siguientes:  Ecologist. Estos drenajes utilizan succin para drenar el lquido de la herida [place]. El drenaje fluye a travs de un tubo hasta un recipiente que est fuera del cuerpo. Con estos drenajes, debe mantener el bulbo o el recipiente de drenaje aplanados (comprimidos) en todo momento, excepto cuando deba vaciarlos. El aplanamiento del bulbo o del recipiente crea succin. . El drenaje se coloca durante la [place]. Inmediatamente despus de la [place], el lquido del drenaje suele ser de color rojo brillante y un poco ms espeso que el agua. Es posible que el drenaje gradualmente se torne de color [place] o rosa y se haga menos espeso. Es probable que Investment banker, operational retire el drenaje cuando ya no drene lquido o cuando la cantidad disminuya a 1o 2cucharadas (de 67ml a 31ml) en un perodo de 24horas. Materiales necesarios:  Nurse, learning disability.  Solucin limpiadora libre de grmenes (salina estril).  Hisopos de algodn.  Esponja de drenaje con gasa dividida: 4 x 4 pulgadas (10 x 10cm).  Cuadrado de gasa: 4 x 4  pulgadas (10 x 10cm). Cmo cuidar el drenaje quirrgico Cuide el drenaje como se lo haya indicado el mdico. Esto es importante para ayudar a Product/process development scientist una infeccin. Si el drenaje se coloca en la espalda o en cualquier otra zona que le sea difcil acceder, solicite ayuda a otra persona para Optometrist las siguientes tareas: Cuidados Emergency planning/management officer piel de alrededor del drenaje seca y [place] con una venda en todo momento.  Controlar todos los [employer] zona del drenaje para detectar signos de infeccin. Est atento a los siguientes signos: ? Enrojecimiento, hinchazn o dolor. ? Pus o mal olor. ? Lquido turbio en el drenaje. ? Sensibilidad o presin en el lugar de salida del drenaje. Cambio del vendaje Siga las indicaciones del mdico acerca de cmo cambiar el vendaje. Cambie el vendaje al menos una vez al da. Cmbielo con ms frecuencia si es necesario para mantenerlo seco. Asegrese de hacer lo siguiente: 1. [employer]. 2. Lvese las manos con agua y [place] antes de cambiar el apsito. Use desinfectante para manos si no dispone de [place] y [place]. 3. Retire el vendaje viejo. Evite hacerlo con tijeras. 4. Lvese las manos con agua y jabn despus de retirar el vendaje viejo. 5. Use una solucin salina estril para limpiar la piel alrededor del drenaje. Es posible que deba usar un hisopo de algodn para Scientist, research (physical sciences). 6. Coloque el tubo a travs de la hendidura en una esponja de drenaje. Coloque la esponja de drenaje de modo que cubra la herida. 7. Coloque el cuadrado de gasa u otra esponja de drenaje por encima de la esponja de drenaje que est sobre la herida. Asegrese de que el tubo est entre esas capas. 8. Pegue el drenaje a la piel con cinta adhesiva. 9. Pegue el tubo de drenaje con cinta adhesiva sobre la piel de 1a 2pulgadas (de 2.5cm a 5cm) por debajo del lugar por donde el tubo entra al cuerpo. Esto evita que el tubo tire y abra los puntos (suturas) que usted  tiene. [place] con agua y [place]. 11. Anote el color del lquido del drenaje y la frecuencia con la que cambia el vendaje. Indicaciones para vaciar el drenaje activo  1. Asegrese de Best boy un recipiente medidor en el que pueda vaciar el drenaje. [place]is  manos con agua y [place]. Use desinfectante para manos si no dispone de [place] y [place]. 3. Afloje los tornillos o las grapas que sostienen el tubo Psychiatrist. 4. Si el mdico le indica que pase suavemente los dedos por el tubo para evitar cogulos y obstrucciones en el tubo: ? Sostenga el tubo en la piel con [place]. Con la otra mano, pellizque el tubo con el pulgar y Software engineer dedo. ? Apriete muy suavemente el tubo, de forma descendente. [employer] modo, se eliminar cualquier drenaje, cogulo o tejido del tubo. ? Es posible que deba realizar esto varias veces por da para mantener el tubo limpio. No tire del tubo. Bright o el tapn del drenaje. No toque el interior de la tapa ni la parte inferior del tapn. 6. Coloque el dispositivo boca abajo y apriete suavemente. 7. Vierta todo el lquido del drenaje en el recipiente medidor. 8. Comprima el bulbo o el recipiente y vuelva a colocar la tapa o el tapn. Para comprimir el bulbo o el recipiente, apritelo firmemente en el medio mientras coloca la tapa o el tapn. 9. Anote la cantidad de lquido del drenaje correspondiente a cada perodo de 24horas. Si tiene menos de 2cucharadas (93ml) de lquido del drenaje durante 24horas, comunquese con su mdico. 10. Tire el lquido del drenaje por el inodoro. [place] con agua y [place]. Comunquese con un mdico si:  Tiene enrojecimiento, hinchazn o dolor alrededor de la zona del drenaje.  Tiene pus o percibe que sale mal olor de la zona del drenaje.  Tiene fiebre o escalofros.  La piel alrededor del drenaje est caliente al tacto.  La cantidad de lquido del drenaje aumenta en lugar de disminuir.  Le  drena un lquido turbio.  Hay una interrupcin o una disminucin repentina en la cantidad de drenaje.  El tubo del drenaje se cae.  El drenaje activo no se mantiene comprimido despus de vaciarlo. Resumen  Los drenajes quirrgicos se utilizan para eliminar el exceso de lquido que normalmente se acumula en una herida quirrgica despus de la [place].  Los distintos tipos de drenajes quirrgicos incluyen drenajes activos y drenajes pasivos. Los drenajes [employer] succin para drenar el lquido de la herida, y los drenajes pasivos dejan que el lquido drene de forma natural.  Es importante cuidar del drenaje para prevenir una infeccin. Si el drenaje se coloca en la espalda, o en cualquier otra zona que le sea difcil acceder, pdale ayuda a otra persona.  Comunquese con el mdico si tiene enrojecimiento, hinchazn o dolor alrededor de la zona del drenaje. Esta informacin no tiene Marine scientist el consejo del mdico. Asegrese de hacerle al mdico cualquier pregunta que tenga. Document Released: 11/25/2005 Document Revised: 01/26/2019 Document Reviewed: 01/26/2019 Elsevier Patient Education  2020 [employer].

## 2019-11-10 NOTE — Procedures (Signed)
Interventional Radiology Procedure Note  Procedure: CT guided drain placement, 55F, into right perinephric collection. Aspiration yields 50 mL thick, purulent fluid.   Complications: None  Estimated Blood Loss: Nne  Recommendations: - Drain to JP - Flush BID x 1 week, then QD for 1 week.  - F/U in clinic with CT ABD with contrast in 2 weeks.   Signed,  Criselda Peaches, MD

## 2019-11-10 NOTE — Progress Notes (Addendum)
Patient is informed that he cannot leave until speaking with Rowe Robert PA from radiology. Patient states he will not wait longer than 4 PM to speak to [facility] as he waited yesterday 2 hours for his appointment. RN explains to patient the importance of staying for this consult.  Reviewed how to empty JP drain. He watches RN empty and recharge JP drain. He verbalizes understanding. Provided patient medicine cups to measure output, gauze 4x4, tape, and NS. Reviewed dressing change instructions with patient. Demonstrated how to flush JP drain with 5 ml of NS. Patient states it is like flushing his picc line that he had.

## 2019-11-11 ENCOUNTER — Other Ambulatory Visit: Payer: Self-pay | Admitting: Urology

## 2019-11-11 ENCOUNTER — Other Ambulatory Visit (HOSPITAL_COMMUNITY): Payer: Self-pay | Admitting: Radiology

## 2019-11-11 DIAGNOSIS — N151 Renal and perinephric abscess: Secondary | ICD-10-CM

## 2019-11-15 LAB — AEROBIC/ANAEROBIC CULTURE W GRAM STAIN (SURGICAL/DEEP WOUND): Special Requests: NORMAL

## 2019-11-15 MED FILL — NORMAL SALINE FLUSH SYRINGE: 0.9 | 10 days supply | Qty: 200 | Fill #0

## 2019-11-23 ENCOUNTER — Other Ambulatory Visit: Payer: Self-pay | Admitting: Interventional Radiology

## 2019-11-23 ENCOUNTER — Encounter: Payer: Self-pay | Admitting: *Deleted

## 2019-11-23 ENCOUNTER — Ambulatory Visit
Admission: RE | Admit: 2019-11-23 | Discharge: 2019-11-23 | Disposition: A | Discharge status: Home / Self Care | Payer: Medicare Other | Source: Ambulatory Visit | Attending: Radiology | Admitting: Radiology

## 2019-11-23 ENCOUNTER — Ambulatory Visit
Admission: RE | Admit: 2019-11-23 | Discharge: 2019-11-23 | Disposition: A | Discharge status: Home / Self Care | Payer: Medicare Other | Source: Ambulatory Visit | Attending: Urology | Admitting: Urology

## 2019-11-23 ENCOUNTER — Other Ambulatory Visit: Payer: Self-pay | Admitting: Urology

## 2019-11-23 DIAGNOSIS — N151 Renal and perinephric abscess: Secondary | ICD-10-CM

## 2019-11-23 HISTORY — PX: IR RADIOLOGIST EVAL & MGMT: IMG5224

## 2019-11-23 MED ORDER — IOPAMIDOL (ISOVUE-300) INJECTION 61%: 100.0000 mL | Freq: Once | INTRAVENOUS | Status: DC | PRN

## 2019-11-23 NOTE — Progress Notes (Signed)
Chief Complaint: Patient was seen in consultation today for (R)perinephric abscess drain at the request of Dr. Burman Nieves   History of Present Illness: Jim Tucker is a 78 y.o. male who had (R)perinephric abscess drain placed on 12/2 He was discharged home in stable condition with drain care instructions.  He had CT yesterday over at the Urology office but hasn't had follow up with Dr. Louis Meckel yet. He is scheduled today for follow up with drain injection;. He has been flushing twice daily. Reports very little output, barely more than he is flushing in. Denies pain, fevers, chills. Voiding well.  Past Medical History:  Diagnosis Date  . Arthritis    shoulders, back  . BPH (benign prostatic hypertrophy)    takes Proscar daily  . Diverticulosis   . History of 2019 novel coronavirus disease (COVID-19) 08/05/2019   Required hospitalization discharged August 2020  . History of colon polyps   . Hyperlipidemia    takes Atorvastatin daily  . Internal hemorrhoid   . LBP (low back pain)    HNP  . Nocturia   . Numbness and tingling    in right leg    Past Surgical History:  Procedure Laterality Date  . BACK SURGERY    . COLONOSCOPY    . CYSTOSCOPY WITH BIOPSY N/A 07/19/2013   Procedure: TRANSURETHRAL RESECTION OF BLADDER NECK;  Surgeon: Bernestine Amass, MD;  Location: [facility];  Service: Urology;  Laterality: N/A;  . LUMBAR LAMINECTOMY Right 08/12/2014   Procedure: Right L2-3 Hemilaminectomy, Removal HNP;  Surgeon: Marybelle Killings, MD;  Location: [facility];  Service: Orthopedics;  Laterality: Right;  . LUMBAR SPINE SURGERY     x 2  . NASAL SINUS SURGERY  40 yrs ago  . PROSTATE BIOPSY  2007, 2010   benign.  Urology - Dr. Ellwood Sayers  . ROTATOR CUFF REPAIR Bilateral    total of 3  . TRANSURETHRAL RESECTION OF PROSTATE  10-23-2009    Allergies: Other  Medications: Prior to Admission medications   Medication Sig Start Date End Date Taking? Authorizing  Provider  atorvastatin (LIPITOR) 80 MG tablet Take 40 mg by mouth daily.  07/23/19   [provider]  feeding supplement, ENSURE ENLIVE, (ENSURE ENLIVE) LIQD Take 237 mLs by mouth 2 (two) times daily between meals. 08/20/19   Lavina Hamman, MD  Ferrous Bisglycinate Chelate 28 MG CAPS Take 1 capsule by mouth 2 (two) times daily. Patient not taking: Reported on 09/13/2019 09/01/19   Gregor Hams, MD  ferrous [id number] C-folic acid (TRINSICON / FOLTRIN) capsule Take 1 capsule by mouth 2 (two) times daily after a meal. 08/20/19   Lavina Hamman, MD  finasteride (PROSCAR) 5 MG tablet Take 1 tablet (5 mg total) by mouth daily. 08/05/19   Gregor Hams, MD     Family History  Problem Relation Age of Onset  . Stomach cancer Brother   . Colon polyps Neg Hx   . Esophageal cancer Neg Hx   . Rectal cancer Neg Hx   . Pancreatic cancer Neg Hx     Social History   Socioeconomic History  . Marital status: Married    Spouse name: Tretha Sciara  . Number of children: 2   . Years of education: Not on file  . Highest education level: Not on file  Occupational History  . Occupation: Retired Clinical biochemist.     Employer: RETIRED  Tobacco Use  . Smoking status: Former Research scientist (life sciences)  .  Smokeless tobacco: Never Used  . Tobacco comment: quit smoking in Mar 2015  Substance and Sexual Activity  . Alcohol use: Yes    Comment: wine occasionally  . Drug use: No  . 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  Other Topics Concern  . 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   Social Determinants of Health   Financial Resource Strain:   . Difficulty of Paying Living Expenses: Not on file  Food Insecurity:   . Worried About Charity fundraiser in the Last Year: Not on file  . Ran Out of Food in the Last Year: Not on file  Transportation  Needs:   . Lack of Transportation (Medical): Not on file  . Lack of Transportation (Non-Medical): Not on file  Physical Activity:   . Days of Exercise per Week: Not on file  . Minutes of Exercise per Session: Not on file  Stress:   . Feeling of Stress : Not on file  Social Connections:   . Frequency of Communication with Friends and Family: Not on file  . Frequency of Social Gatherings with Friends and Family: Not on file  . Attends Religious Services: Not on file  . Active Member of Clubs or Organizations: Not on file  . Attends Archivist Meetings: Not on file  . Marital Status: Not on file     Review of Systems: A 12 point ROS discussed and pertinent positives are indicated in the HPI above.  All other systems are negative.  Review of Systems  Vital Signs: There were no vitals taken for this visit.  Physical Exam (R)flank drain intact, site clean, dry Output scant pale serosanguinous Drain injection performed, see report.    Labs:  CBC: Recent Labs    08/19/19 1336 08/19/19 2031 08/20/19 0653 11/10/19 1034  WBC 10.0 9.4 7.1 13.1*  HGB 12.6* 10.3* 9.0* 12.1*  HCT 36.9* 31.4* 27.6* 39.1  PLT 179 160 153 415*    COAGS: Recent Labs    08/19/19 2138 11/10/19 1034  INR 1.1 1.0  APTT 33  --     BMP: Recent Labs    08/19/19 1336 08/19/19 2031 08/20/19 0653 11/10/19 1034  NA 134* 135 137 139  K 3.9 3.5 3.1* 4.0  CL 100 104 102 104  CO2 23 22 24 22   GLUCOSE 150* 193* 136* 115*  BUN 27* 28* 28* 42*  CALCIUM 8.6 8.3* 8.2* 9.6  CREATININE 0.62* 0.63 0.54* 1.05  GFRNONAA 95 >60 >60 >60  GFRAA 110 >60 >60 >60    LIVER FUNCTION TESTS: Recent Labs    07/21/19 0255 07/22/19 0110 08/05/19 0847 08/19/19 1336 08/19/19 2031 08/20/19 0653  BILITOT 0.7 0.4 0.7 0.8 0.6 0.8  AST 25 16 24 25 17  14*  ALT 101* 75* 56* 36 33 27  ALKPHOS 140* 128*  --   --  96 86  PROT 5.3* 5.3* 5.7* 6.1 5.6* 5.1*  ALBUMIN 2.3* 2.3*  --   --  2.5* 2.2*     TUMOR MARKERS: No results for input(s): AFPTM, CEA, CA199, CHROMGRNA in the last 8760 hours.  Assessment and Plan: (R)perinephric abscess S/p perc drain 12/2 Reviewed CT from yesterday, still with residual phlegmonous collection but improved. Drain injection today does not appear to show any communication to the (R)kidney Recommend cont  drain care as is. He will see Dr. Louis Meckel next week and unless he plans otherwise, we will set the pt up for repeat imaging study and evaluation in about two weeks.  Thank you for this interesting consult.  I greatly enjoyed meeting Paxon Cuartas and look forward to participating in their care.  A copy of this report was sent to the requesting provider on this date.  Electronically Signed: Ascencion Dike 11/23/2019, 1:39 PM   I spent a total of 20 minutes in face to face in clinical consultation, greater than 50% of which was counseling/coordinating care for (R)perinephric drain

## 2019-11-25 MED FILL — NORMAL SALINE FLUSH SYRINGE: 0.9 | 11 days supply | Qty: 220 | Fill #0

## 2019-11-29 ENCOUNTER — Other Ambulatory Visit: Payer: Self-pay | Admitting: Family Medicine

## 2019-11-30 ENCOUNTER — Other Ambulatory Visit: Payer: Self-pay | Admitting: Sports Medicine

## 2019-11-30 MED ORDER — FINASTERIDE 5 MG PO TABS: 5.0000 mg | ORAL_TABLET | Freq: Every day | ORAL | 3 refills | Status: AC

## 2019-12-06 ENCOUNTER — Other Ambulatory Visit: Payer: Self-pay

## 2019-12-06 ENCOUNTER — Encounter (HOSPITAL_COMMUNITY): Payer: Self-pay

## 2019-12-06 ENCOUNTER — Ambulatory Visit (HOSPITAL_COMMUNITY)
Admission: RE | Admit: 2019-12-06 | Discharge: 2019-12-06 | Disposition: A | Discharge status: Home / Self Care | Payer: Medicare Other | Source: Ambulatory Visit | Attending: Interventional Radiology | Admitting: Interventional Radiology

## 2019-12-06 DIAGNOSIS — T85698A Other mechanical complication of other specified internal prosthetic devices, implants and grafts, initial encounter: Secondary | ICD-10-CM | POA: Diagnosis not present

## 2019-12-06 DIAGNOSIS — N200 Calculus of kidney: Secondary | ICD-10-CM | POA: Diagnosis not present

## 2019-12-06 DIAGNOSIS — N151 Renal and perinephric abscess: Secondary | ICD-10-CM

## 2019-12-06 DIAGNOSIS — M7981 Nontraumatic hematoma of soft tissue: Secondary | ICD-10-CM | POA: Diagnosis not present

## 2019-12-06 HISTORY — PX: IR SINUS/FIST TUBE CHK-NON GI: IMG673

## 2019-12-06 MED ORDER — IOHEXOL 300 MG/ML  SOLN
50.0000 mL | Freq: Once | INTRAMUSCULAR | Status: AC | PRN
  Administered 2019-12-06: 14:00:00 11 mL

## 2019-12-06 MED ORDER — SODIUM CHLORIDE (PF) 0.9 % IJ SOLN
INTRAMUSCULAR | Status: AC
  Filled 2019-12-06: qty 50

## 2019-12-06 MED ORDER — IOHEXOL 300 MG/ML  SOLN
100.0000 mL | Freq: Once | INTRAMUSCULAR | Status: AC | PRN
  Administered 2019-12-06: 14:00:00 100 mL via INTRAVENOUS

## 2019-12-06 NOTE — Procedures (Signed)
Pre procedural Dx: Perinephric abscess Post procedural Dx: Same  Drainage catheter injection demonstrates opacification of chronic right-sided perinephric hematoma/abscess cavity without evidence of communication to the right renal collecting system.  Given lack of substantial change of the residual soft tissue/phlegmon about the right kidney, lack of any substantial output from the drainage catheter for the past several days as well as lack of definitive communication to the right renal collecting system on today's drain injection, the decision was made to remove the drainage catheter following conversation with providing urologist, Dr. Louis Meckel.    Drainage catheter removal was performed at the patient's bedside without incident.   EBL: None Complications: None immediate  PLAN:  - The patient was encouraged to maintain compliance with the remainder of his prescribed oral antibiotics. - The patient was encouraged to maintain all follow-up appointments with referring urologist, Dr. Louis Meckel.   Jim Bacon, MD Pager #: [phone]

## 2020-01-06 DIAGNOSIS — N151 Renal and perinephric abscess: Secondary | ICD-10-CM | POA: Diagnosis not present

## 2020-01-06 DIAGNOSIS — N200 Calculus of kidney: Secondary | ICD-10-CM | POA: Diagnosis not present

## 2020-01-10 DIAGNOSIS — N151 Renal and perinephric abscess: Secondary | ICD-10-CM | POA: Diagnosis not present

## 2020-01-13 DIAGNOSIS — Z23 Encounter for immunization: Secondary | ICD-10-CM | POA: Diagnosis not present

## 2020-01-26 ENCOUNTER — Ambulatory Visit: Payer: Medicare Other | Admitting: Family Medicine

## 2020-01-26 ENCOUNTER — Other Ambulatory Visit: Payer: Self-pay

## 2020-01-26 ENCOUNTER — Encounter: Payer: Self-pay | Admitting: Family Medicine

## 2020-01-26 ENCOUNTER — Ambulatory Visit (INDEPENDENT_AMBULATORY_CARE_PROVIDER_SITE_OTHER): Payer: Medicare Other | Admitting: Family Medicine

## 2020-01-26 VITALS — BP 130/82 | HR 89 | Temp 97.8°F | Ht 63.0 in | Wt 139.0 lb

## 2020-01-26 DIAGNOSIS — S37011A Minor contusion of right kidney, initial encounter: Secondary | ICD-10-CM

## 2020-01-26 DIAGNOSIS — N489 Disorder of penis, unspecified: Secondary | ICD-10-CM

## 2020-01-26 DIAGNOSIS — M546 Pain in thoracic spine: Secondary | ICD-10-CM

## 2020-01-26 MED ORDER — BACLOFEN 10 MG PO TABS: 10.0000 mg | ORAL_TABLET | Freq: Three times a day (TID) | ORAL | 0 refills | Status: DC

## 2020-01-26 MED ORDER — MUPIROCIN 2 % EX OINT: 1.0000 "application " | TOPICAL_OINTMENT | Freq: Two times a day (BID) | CUTANEOUS | 0 refills | Status: AC

## 2020-01-26 NOTE — Progress Notes (Signed)
[address] [place] - 79 y.o. male MRN [id number]  Date of birth: Nov 08, 1941  Subjective No chief complaint on file.   HPI Jim Tucker is a 79 y.o. male with history of HTN, BPH, chronic low back pain and recent infection of renal hematoma with ESBL here today with mid back pain. Current pain started about 2 weeks ago.  He denies radiation of pain.  He has not noticed anything that makes this better or worse other than movement/positioning.  He denies dysuria, hematuria, frequency, flank pain, nausea, fever, chills or changes to appetite.  He has noticed a lesion on his penis.  He denies pain related to this.  He also has had itching all over.  He denies rash related to this.  ROS:  A comprehensive ROS was completed and negative except as noted per HPI    Allergies  Allergen Reactions  . Other Other (See Comments)     Raw Apples swells throat    Past Medical History:  Diagnosis Date  . Arthritis    shoulders, back  . BPH (benign prostatic hypertrophy)    takes Proscar daily  . Diverticulosis   . History of 2019 novel coronavirus disease (COVID-19) 08/05/2019   Required hospitalization discharged August 2020  . History of colon polyps   . Hyperlipidemia    takes Atorvastatin daily  . Internal hemorrhoid   . LBP (low back pain)    HNP  . Nocturia   . Numbness and tingling    in right leg    Past Surgical History:  Procedure Laterality Date  . BACK SURGERY    . COLONOSCOPY    . CYSTOSCOPY WITH BIOPSY N/A 07/19/2013   Procedure: TRANSURETHRAL RESECTION OF BLADDER NECK;  Surgeon: Bernestine Amass, MD;  Location: [facility];  Service: Urology;  Laterality: N/A;  . IR RADIOLOGIST EVAL & MGMT  11/23/2019  . IR SINUS/FIST TUBE CHK-NON GI  12/06/2019  . LUMBAR LAMINECTOMY Right 08/12/2014   Procedure: Right L2-3 Hemilaminectomy, Removal HNP;  Surgeon: Marybelle Killings, MD;  Location: [facility];  Service: Orthopedics;  Laterality: Right;  . LUMBAR SPINE SURGERY     x 2  .  NASAL SINUS SURGERY  40 yrs ago  . PROSTATE BIOPSY  2007, 2010   benign.  Urology - Dr. Ellwood Sayers  . ROTATOR CUFF REPAIR Bilateral    total of 3  . TRANSURETHRAL RESECTION OF PROSTATE  10-23-2009    Social History   Socioeconomic History  . Marital status: Married    Spouse name: Tretha Sciara  . Number of children: 2   . Years of education: Not on file  . Highest education level: Not on file  Occupational History  . Occupation: Retired Clinical biochemist.     Employer: RETIRED  Tobacco Use  . Smoking status: Former Research scientist (life sciences)  . Smokeless tobacco: Never Used  . Tobacco comment: quit smoking in Mar 2015  Substance and Sexual Activity  . Alcohol use: Yes    Comment: wine occasionally  . Drug use: No  . 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  Other Topics Concern  . 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   Social Determinants of Health   Financial Resource Strain:   . Difficulty of Paying Living Expenses: Not on file  Food Insecurity:   . Worried  About Running Out of Food in the Last Year: Not on file  . Ran Out of Food in the Last Year: Not on file  Transportation Needs:   . Lack of Transportation (Medical): Not on file  . Lack of Transportation (Non-Medical): Not on file  Physical Activity:   . Days of Exercise per Week: Not on file  . Minutes of Exercise per Session: Not on file  Stress:   . Feeling of Stress : Not on file  Social Connections:   . Frequency of Communication with Friends and Family: Not on file  . Frequency of Social Gatherings with Friends and Family: Not on file  . Attends Religious Services: Not on file  . Active Member of Clubs or Organizations: Not on file  . Attends Archivist Meetings: Not on file  . Marital Status: Not on file    Family History  Problem Relation Age of Onset   . Stomach cancer Brother   . Colon polyps Neg Hx   . Esophageal cancer Neg Hx   . Rectal cancer Neg Hx   . Pancreatic cancer Neg Hx     Health Maintenance  Topic Date Due  . COLONOSCOPY  08/22/2023  . TETANUS/TDAP  05/02/2026  . INFLUENZA VACCINE  Completed  . PNA vac Low Risk Adult  Completed    ----------------------------------------------------------------------------------------------------------------------------------------------------------------------------------------------------------------- Physical Exam BP 130/82   Pulse 89   Temp 97.8 F (36.6 C) (Oral)   Ht 5\' 3"  (1.6 m)   Wt 139 lb (63 kg)   BMI 24.62 kg/m   Physical Exam Constitutional:      Appearance: Normal appearance.  HENT:     Head: Normocephalic and atraumatic.  Eyes:     General: No scleral icterus. Cardiovascular:     Rate and Rhythm: Normal rate and regular rhythm.  Pulmonary:     Effort: Pulmonary effort is normal.     Breath sounds: Normal breath sounds.  Abdominal:     Tenderness: There is no right CVA tenderness or left CVA tenderness.  Genitourinary:    Comments: There are a couple of small painless ulcerations to shaft of penis.   Musculoskeletal:     Cervical back: Neck supple.     Comments: Mild ttp along rhomboids on R.   Skin:    General: Skin is warm and dry.     Findings: No rash.  Neurological:     General: No focal deficit present.     Mental Status: He is alert.     ------------------------------------------------------------------------------------------------------------------------------------------------------------------------------------------------------------------- Assessment and Plan  Acute right-sided thoracic back pain Seems to be related to MSK cause.  Will start baclofen tid prn.   Given history of ESBL UTI/renal infection Will check UA.  Also check renal and liver function given c/o itching.    Penile lesion Painless.  Denies potential STI  exposure.  Will try topical mupirocin x10 days.  Instructed to return if not improving.  Will check RPR and swab for HSV.      This visit occurred during the SARS-CoV-2 public health emergency.  Safety protocols were in place, including screening questions prior to the visit, additional usage of staff PPE, and extensive cleaning of exam room while observing appropriate contact time as indicated for disinfecting solutions.

## 2020-01-26 NOTE — Patient Instructions (Addendum)
Great to meet you today! Please let me know if areas on penis are not improving after 10-14 days Try baclofen for the back pain.  Use every 8 hours as needed.  We'll be in touch with lab results.

## 2020-01-26 NOTE — Assessment & Plan Note (Signed)
Seems to be related to MSK cause.  Will start baclofen tid prn.   Given history of ESBL UTI/renal infection Will check UA.  Also check renal and liver function given c/o itching.

## 2020-01-26 NOTE — Assessment & Plan Note (Signed)
Painless.  Denies potential STI exposure.  Will try topical mupirocin x10 days.  Instructed to return if not improving.  Will check RPR and swab for HSV.

## 2020-01-27 LAB — URINALYSIS, ROUTINE W REFLEX MICROSCOPIC
Bacteria, UA: NONE SEEN /HPF
Bilirubin Urine: NEGATIVE
Glucose, UA: NEGATIVE
Hgb urine dipstick: NEGATIVE
Hyaline Cast: NONE SEEN /LPF
Nitrite: NEGATIVE
Protein, ur: NEGATIVE
RBC / HPF: NONE SEEN /HPF (ref 0–2)
Specific Gravity, Urine: 1.023 (ref 1.001–1.03)
Squamous Epithelial / HPF: NONE SEEN /HPF (ref <=5)
pH: 5.5 (ref 5.0–8.0)

## 2020-01-27 LAB — CBC WITH DIFFERENTIAL/PLATELET
Absolute Monocytes: 696 {cells}/uL (ref 200–950)
Basophils Absolute: 59 {cells}/uL (ref 0–200)
Basophils Relative: 0.9 %
Eosinophils Absolute: 72 {cells}/uL (ref 15–500)
Eosinophils Relative: 1.1 %
HCT: 43.3 % (ref 38.5–50.0)
Hemoglobin: 14.6 g/dL (ref 13.2–17.1)
Lymphs Abs: 1268 {cells}/uL (ref 850–3900)
MCH: 29.5 pg (ref 27.0–33.0)
MCHC: 33.7 g/dL (ref 32.0–36.0)
MCV: 87.5 fL (ref 80.0–100.0)
MPV: 11.4 fL (ref 7.5–12.5)
Monocytes Relative: 10.7 %
Neutro Abs: 4407 {cells}/uL (ref 1500–7800)
Neutrophils Relative %: 67.8 %
Platelets: 226 Thousand/uL (ref 140–400)
RBC: 4.95 Million/uL (ref 4.20–5.80)
RDW: 15.8 % — ABNORMAL HIGH (ref 11.0–15.0)
Total Lymphocyte: 19.5 %
WBC: 6.5 Thousand/uL (ref 3.8–10.8)

## 2020-01-27 LAB — COMPLETE METABOLIC PANEL WITH GFR
AG Ratio: 1.6 (calc) (ref 1.0–2.5)
ALT: 28 U/L (ref 9–46)
AST: 24 U/L (ref 10–35)
Albumin: 4.4 g/dL (ref 3.6–5.1)
Alkaline phosphatase (APISO): 116 U/L (ref 35–144)
BUN/Creatinine Ratio: 41 (calc) — ABNORMAL HIGH (ref 6–22)
BUN: 29 mg/dL — ABNORMAL HIGH (ref 7–25)
CO2: 24 mmol/L (ref 20–32)
Calcium: 9.8 mg/dL (ref 8.6–10.3)
Chloride: 104 mmol/L (ref 98–110)
Creat: 0.71 mg/dL (ref 0.70–1.18)
GFR, Est African American: 104 mL/min/{1.73_m2} (ref >=60)
GFR, Est Non African American: 90 mL/min/{1.73_m2} (ref >=60)
Globulin: 2.7 g/dL (calc) (ref 1.9–3.7)
Glucose, Bld: 109 mg/dL (ref 65–139)
Potassium: 3.8 mmol/L (ref 3.5–5.3)
Sodium: 138 mmol/L (ref 135–146)
Total Bilirubin: 0.7 mg/dL (ref 0.2–1.2)
Total Protein: 7.1 g/dL (ref 6.1–8.1)

## 2020-02-11 DIAGNOSIS — Z23 Encounter for immunization: Secondary | ICD-10-CM | POA: Diagnosis not present

## 2020-02-18 ENCOUNTER — Other Ambulatory Visit: Payer: Self-pay | Admitting: Family Medicine

## 2020-02-24 ENCOUNTER — Ambulatory Visit: Payer: Medicare Other | Admitting: Orthopedic Surgery

## 2020-03-30 ENCOUNTER — Other Ambulatory Visit: Payer: Self-pay | Admitting: Family Medicine

## 2020-04-03 ENCOUNTER — Other Ambulatory Visit: Payer: Self-pay | Admitting: Family Medicine

## 2020-04-04 ENCOUNTER — Other Ambulatory Visit: Payer: Self-pay

## 2020-04-06 ENCOUNTER — Other Ambulatory Visit: Payer: Self-pay

## 2020-04-06 DIAGNOSIS — E782 Mixed hyperlipidemia: Secondary | ICD-10-CM

## 2020-04-06 MED ORDER — ATORVASTATIN CALCIUM 80 MG PO TABS: 80.0000 mg | ORAL_TABLET | Freq: Every day | ORAL | 1 refills | Status: DC

## 2020-04-07 ENCOUNTER — Other Ambulatory Visit: Payer: Self-pay

## 2020-04-07 ENCOUNTER — Ambulatory Visit (INDEPENDENT_AMBULATORY_CARE_PROVIDER_SITE_OTHER): Payer: Medicare Other | Admitting: Family Medicine

## 2020-04-07 ENCOUNTER — Other Ambulatory Visit: Payer: Self-pay | Admitting: Family Medicine

## 2020-04-07 ENCOUNTER — Encounter: Payer: Self-pay | Admitting: Family Medicine

## 2020-04-07 VITALS — BP 128/66 | Wt 131.5 lb

## 2020-04-07 DIAGNOSIS — N644 Mastodynia: Secondary | ICD-10-CM

## 2020-04-07 DIAGNOSIS — E782 Mixed hyperlipidemia: Secondary | ICD-10-CM

## 2020-04-07 DIAGNOSIS — N63 Unspecified lump in unspecified breast: Secondary | ICD-10-CM

## 2020-04-07 MED ORDER — HYDROCHLOROTHIAZIDE 25 MG PO TABS: 25.0000 mg | ORAL_TABLET | Freq: Every day | ORAL | 2 refills | Status: AC

## 2020-04-07 MED ORDER — BACLOFEN 10 MG PO TABS: 10.0000 mg | ORAL_TABLET | Freq: Three times a day (TID) | ORAL | 3 refills | Status: AC

## 2020-04-07 MED ORDER — ATORVASTATIN CALCIUM 80 MG PO TABS: 80.0000 mg | ORAL_TABLET | Freq: Every day | ORAL | 2 refills | Status: AC

## 2020-04-07 NOTE — Patient Instructions (Signed)
You should be contacted to set up appt for imaging.

## 2020-04-07 NOTE — Progress Notes (Signed)
[address] [place] - 79 y.o. male MRN [id number]  Date of birth: 1941/07/27  Subjective Chief Complaint  Patient presents with  . Breast Pain    HPI Jim Tucker is a 79 y.o. here today with complaint of L nipple pain and tenderness.  Reports that this started last week.  He noticed small nodule adjacent to nipple as well.  He denies nipple discharge, axillary swelling. He does report some chills last week for one day but nothing since then.  He has no known family history of breast cancer.   ROS:  A comprehensive ROS was completed and negative except as noted per HPI  Allergies  Allergen Reactions  . Other Other (See Comments)     Raw Apples swells throat    Past Medical History:  Diagnosis Date  . Arthritis    shoulders, back  . BPH (benign prostatic hypertrophy)    takes Proscar daily  . Diverticulosis   . History of 2019 novel coronavirus disease (COVID-19) 08/05/2019   Required hospitalization discharged August 2020  . History of colon polyps   . Hyperlipidemia    takes Atorvastatin daily  . Internal hemorrhoid   . LBP (low back pain)    HNP  . Nocturia   . Numbness and tingling    in right leg    Past Surgical History:  Procedure Laterality Date  . BACK SURGERY    . COLONOSCOPY    . CYSTOSCOPY WITH BIOPSY N/A 07/19/2013   Procedure: TRANSURETHRAL RESECTION OF BLADDER NECK;  Surgeon: Bernestine Amass, MD;  Location: [facility];  Service: Urology;  Laterality: N/A;  . IR RADIOLOGIST EVAL & MGMT  11/23/2019  . IR SINUS/FIST TUBE CHK-NON GI  12/06/2019  . LUMBAR LAMINECTOMY Right 08/12/2014   Procedure: Right L2-3 Hemilaminectomy, Removal HNP;  Surgeon: Marybelle Killings, MD;  Location: [facility];  Service: Orthopedics;  Laterality: Right;  . LUMBAR SPINE SURGERY     x 2  . NASAL SINUS SURGERY  40 yrs ago  . PROSTATE BIOPSY  2007, 2010   benign.  Urology - Dr. Ellwood Sayers  . ROTATOR CUFF REPAIR Bilateral    total of 3  . TRANSURETHRAL RESECTION OF PROSTATE   10-23-2009    Social History   Socioeconomic History  . Marital status: Married    Spouse name: Tretha Sciara  . Number of children: 2   . Years of education: Not on file  . Highest education level: Not on file  Occupational History  . Occupation: Retired Clinical biochemist.     Employer: RETIRED  Tobacco Use  . Smoking status: Former Research scientist (life sciences)  . Smokeless tobacco: Never Used  . Tobacco comment: quit smoking in Mar 2015  Substance and Sexual Activity  . Alcohol use: Yes    Comment: wine occasionally  . Drug use: No  . 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  Other Topics Concern  . 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   Social Determinants of Health   Financial Resource Strain:   . Difficulty of Paying Living Expenses:   Food Insecurity:   . Worried About Charity fundraiser in the Last Year:   . Arboriculturist in the Last Year:   Transportation Needs:   . Film/video editor (Medical):   Marland Kitchen Lack of Transportation (  Non-Medical):   Physical Activity:   . Days of Exercise per Week:   . Minutes of Exercise per Session:   Stress:   . Feeling of Stress :   Social Connections:   . Frequency of Communication with Friends and Family:   . Frequency of Social Gatherings with Friends and Family:   . Attends Religious Services:   . Active Member of Clubs or Organizations:   . Attends Archivist Meetings:   Marland Kitchen Marital Status:     Family History  Problem Relation Age of Onset  . Stomach cancer Brother   . Colon polyps Neg Hx   . Esophageal cancer Neg Hx   . Rectal cancer Neg Hx   . Pancreatic cancer Neg Hx     Health Maintenance  Topic Date Due  . INFLUENZA VACCINE  07/09/2020  . COLONOSCOPY  08/22/2023  . TETANUS/TDAP  05/02/2026  . PNA vac Low Risk Adult  Completed  . COVID-19 Vaccine   Discontinued     ----------------------------------------------------------------------------------------------------------------------------------------------------------------------------------------------------------------- Physical Exam BP 128/66 (BP Location: Left Arm, Patient Position: Sitting, Cuff Size: Normal)   Wt 131 lb 8 oz (59.6 kg)   BMI 23.29 kg/m   Physical Exam Constitutional:      Appearance: Normal appearance.  HENT:     Head: Normocephalic and atraumatic.  Cardiovascular:     Rate and Rhythm: Normal rate and regular rhythm.  Chest:    Skin:    Comments: TTP around L nipple Small, linear nodule adjacent to L nipple that is mildly tender.   Neurological:     General: No focal deficit present.     Mental Status: He is alert.  Psychiatric:        Mood and Affect: Mood normal.        Behavior: Behavior normal.     ------------------------------------------------------------------------------------------------------------------------------------------------------------------------------------------------------------------- Assessment and Plan  Nipple pain [place] of L breast ordered for further evaluation of pain and nodule.    No orders of the defined types were placed in this encounter.   No follow-ups on file.    This visit occurred during the SARS-CoV-2 public health emergency.  Safety protocols were in place, including screening questions prior to the visit, additional usage of staff PPE, and extensive cleaning of exam room while observing appropriate contact time as indicated for disinfecting solutions.

## 2020-04-07 NOTE — Assessment & Plan Note (Signed)
[place] of L breast ordered for further evaluation of pain and nodule.

## 2020-04-10 ENCOUNTER — Other Ambulatory Visit: Payer: Self-pay | Admitting: Family Medicine

## 2020-04-10 DIAGNOSIS — N63 Unspecified lump in unspecified breast: Secondary | ICD-10-CM

## 2020-04-10 DIAGNOSIS — N644 Mastodynia: Secondary | ICD-10-CM

## 2020-04-12 ENCOUNTER — Ambulatory Visit: Payer: Medicare Other

## 2020-04-12 ENCOUNTER — Other Ambulatory Visit: Payer: Self-pay

## 2020-04-12 ENCOUNTER — Ambulatory Visit
Admission: RE | Admit: 2020-04-12 | Discharge: 2020-04-12 | Disposition: A | Discharge status: Home / Self Care | Payer: Medicare Other | Source: Ambulatory Visit | Attending: Family Medicine | Admitting: Family Medicine

## 2020-04-12 DIAGNOSIS — N63 Unspecified lump in unspecified breast: Secondary | ICD-10-CM

## 2020-04-12 DIAGNOSIS — N644 Mastodynia: Secondary | ICD-10-CM

## 2020-04-12 DIAGNOSIS — R928 Other abnormal and inconclusive findings on diagnostic imaging of breast: Secondary | ICD-10-CM | POA: Diagnosis not present

## 2020-04-13 ENCOUNTER — Telehealth: Payer: Self-pay

## 2020-04-13 DIAGNOSIS — R3915 Urgency of urination: Secondary | ICD-10-CM | POA: Diagnosis not present

## 2020-04-13 NOTE — Telephone Encounter (Signed)
Jim Tucker is concerned about baclofen causing the gynecomastia.  He has stopped taking the medicine after receiving mammogram result information from the site.   Requesting direction on the medication.

## 2020-04-13 NOTE — Telephone Encounter (Signed)
Advised patient to continue Baclofen per Dr. Zigmund Daniel.   Patient states that the "Doctor" at Radiology told him the medicine was causing gyncomastia.   He is going to stop taking baclofen for a while and see what it does.

## 2020-04-18 ENCOUNTER — Other Ambulatory Visit: Payer: Medicare Other

## 2020-05-05 DIAGNOSIS — R3915 Urgency of urination: Secondary | ICD-10-CM | POA: Diagnosis not present

## 2020-06-20 DIAGNOSIS — I10 Essential (primary) hypertension: Secondary | ICD-10-CM | POA: Diagnosis not present

## 2020-06-20 DIAGNOSIS — N390 Urinary tract infection, site not specified: Secondary | ICD-10-CM | POA: Diagnosis not present

## 2020-06-20 DIAGNOSIS — E785 Hyperlipidemia, unspecified: Secondary | ICD-10-CM | POA: Diagnosis not present

## 2020-08-08 DIAGNOSIS — Z23 Encounter for immunization: Secondary | ICD-10-CM | POA: Diagnosis not present

## 2020-12-03 IMAGING — MG DIGITAL DIAGNOSTIC BILAT W/ TOMO W/ CAD
6 of 10 series · 6 of 30 positions shown · non-contrast
Comparison: None.

CLINICAL DATA: 78-year-old male complains of subareolar left breast
pain.

EXAM:
DIGITAL DIAGNOSTIC BILATERAL MAMMOGRAM WITH CAD AND TOMO

[L CC synth-2D (1 of 2)]
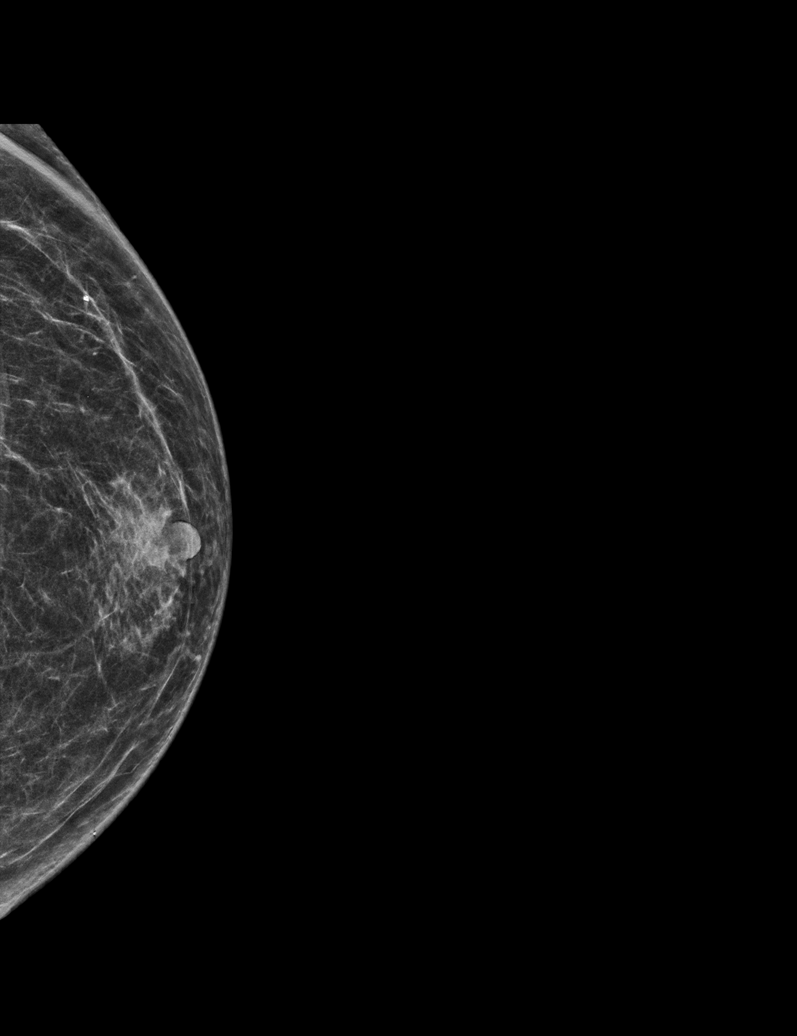

[R CC synth-2D]
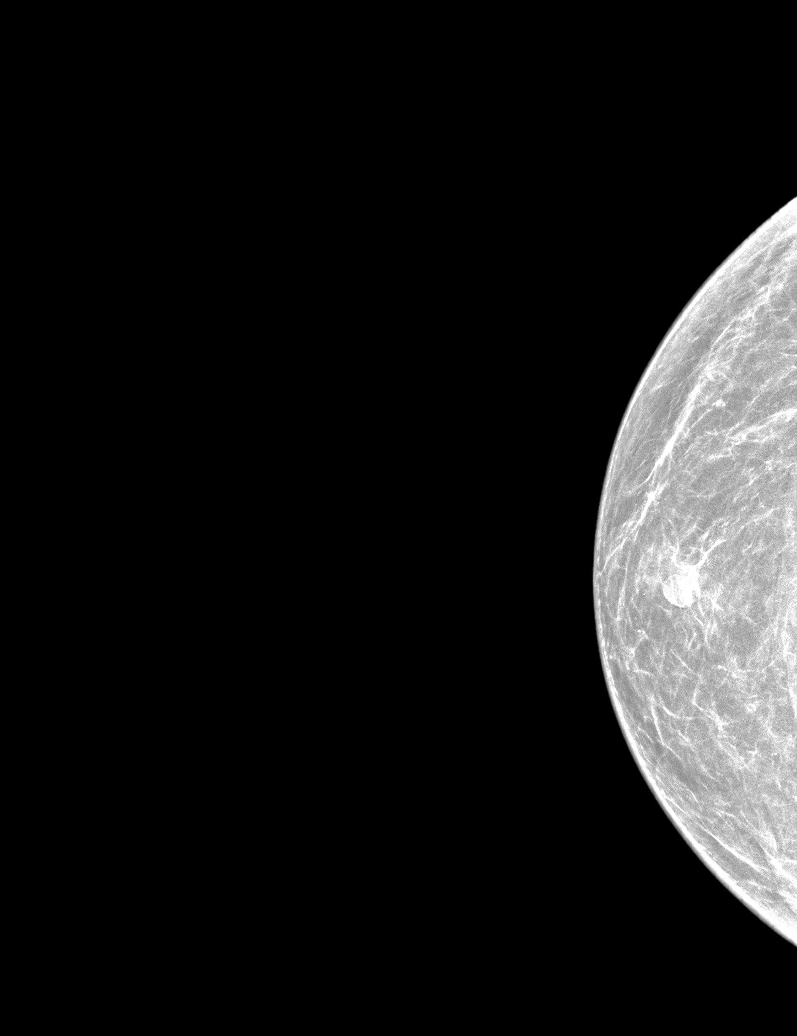

[R MLO synth-2D]
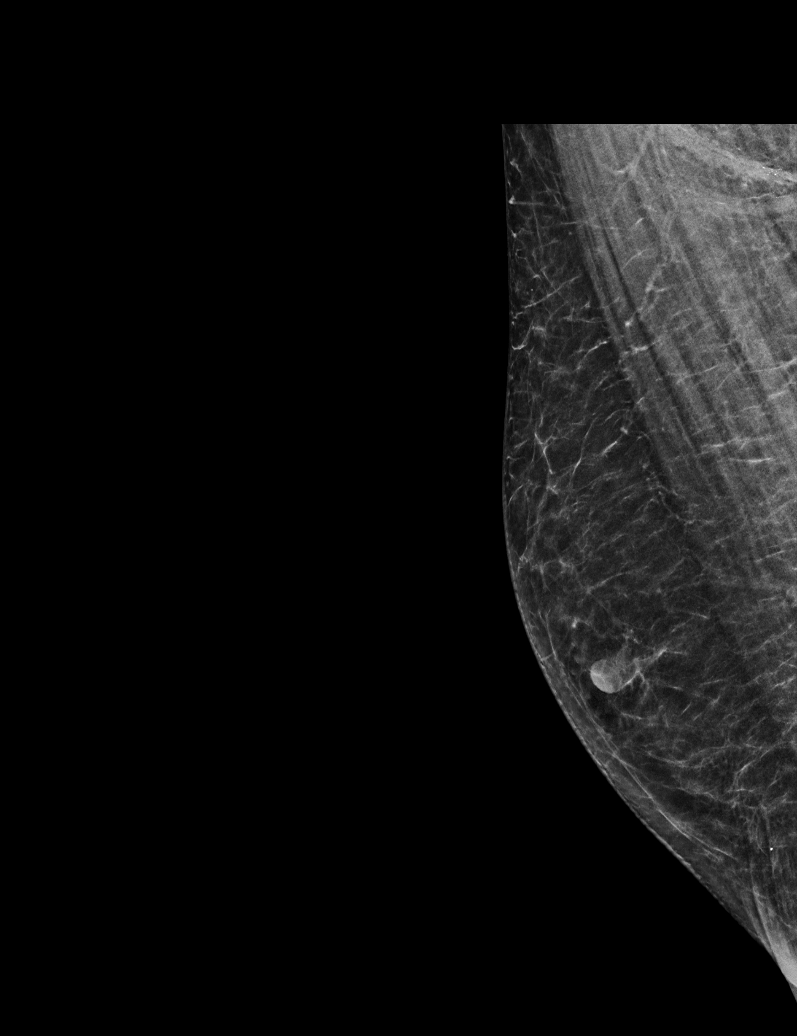

[L CC synth-2D (2 of 2)]
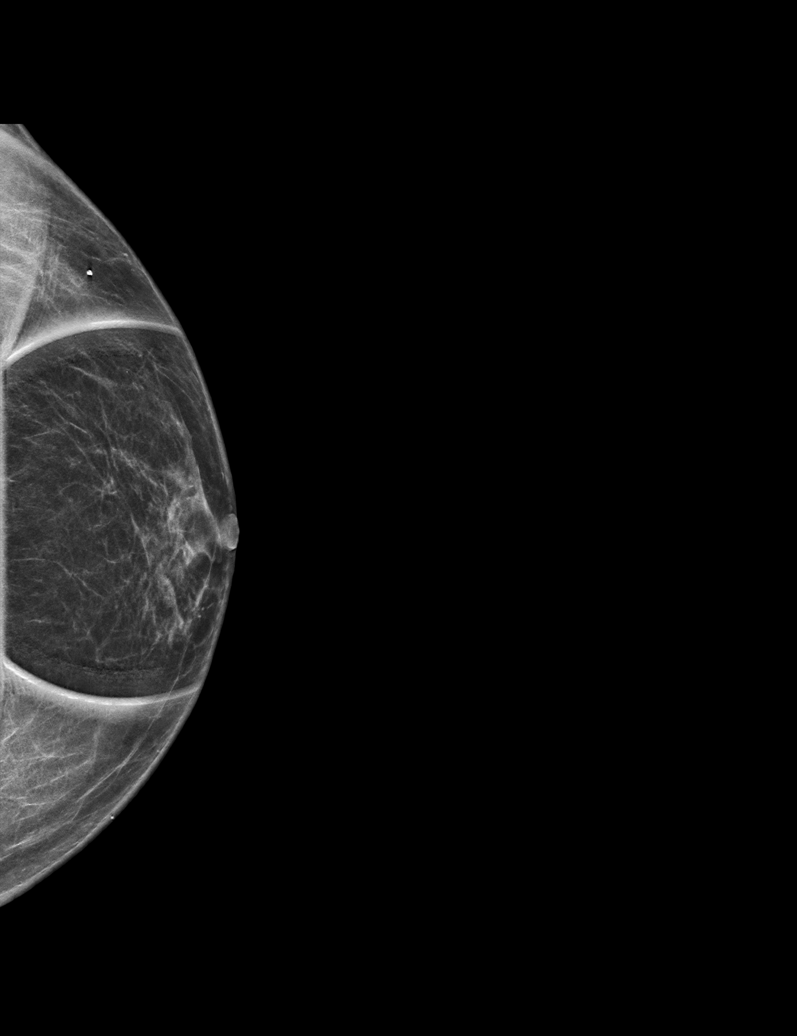

[L MLO synth-2D]
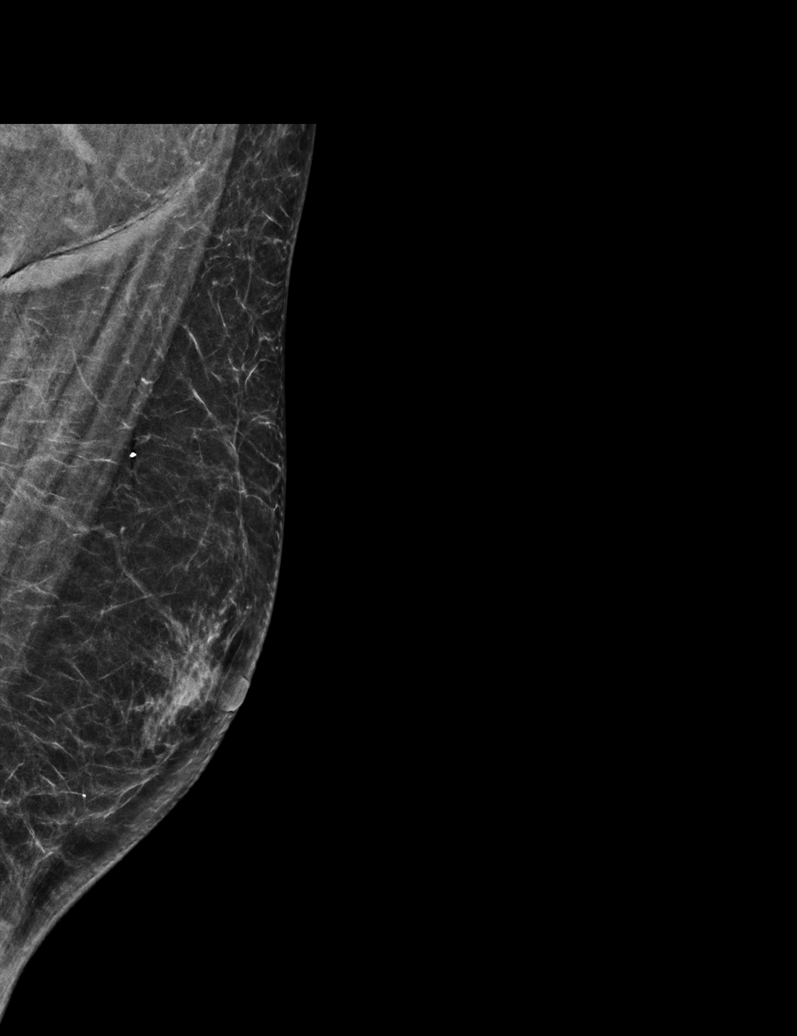

[L CC tomo · tomo slice 21/41.0]
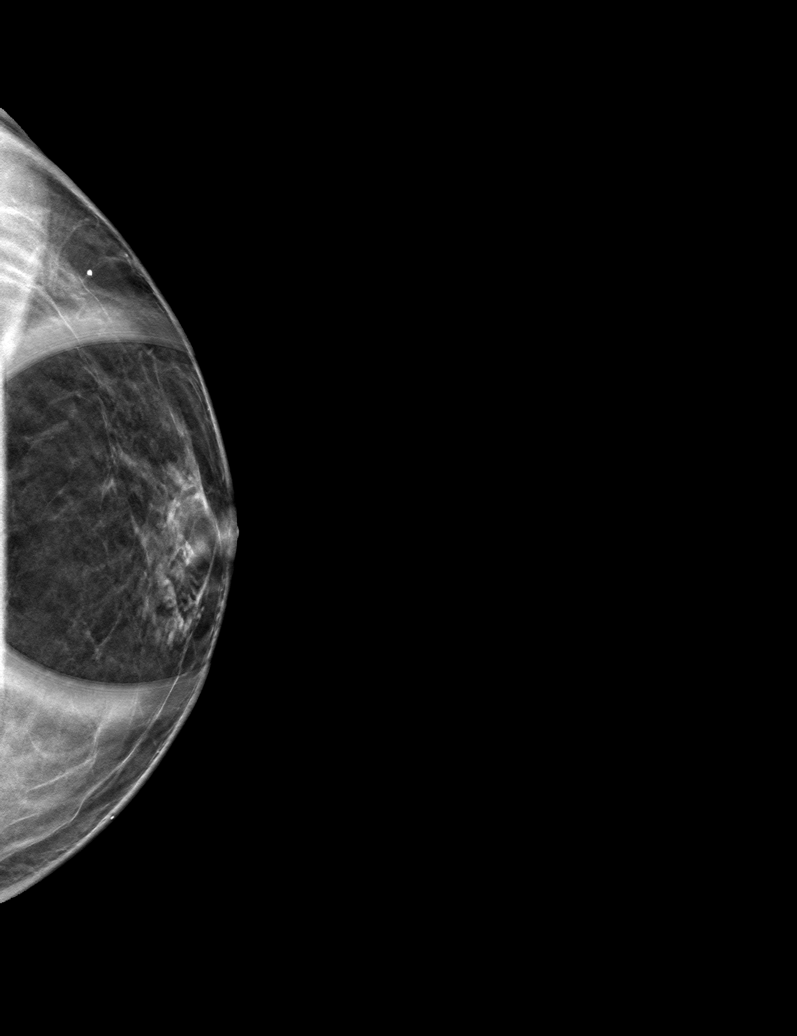

[6 of 30 positions shown; findings below may reference images not displayed]

ACR Breast Density Category b: There are scattered areas of
fibroglandular density.
FINDINGS: No suspicious mass or malignant type microcalcifications identified
in the right breast. In the subareolar region of the left breast
there is asymmetric fibroglandular tissue consistent with
gynecomastia. There is no suspicious mass or malignant type
microcalcifications in the left breast.

Mammographic images were processed with CAD.
IMPRESSION: Gynecomastia of the left breast. No evidence of malignancy in either
breast.

RECOMMENDATION:
If the clinical exam remains benign/stable no follow-up is needed.

I have discussed the findings and recommendations with the patient.
If applicable, a reminder letter will be sent to the patient
regarding the next appointment.

BI-RADS CATEGORY  2: Benign.
